# Patient Record
Sex: Male | Born: 1950 | Race: Black or African American | Hispanic: No | Marital: Married | Smoking: Former smoker
Health system: Southern US, Community
[De-identification: ages and names within clinical notes are randomized; demographics above are authoritative.]

## PROBLEM LIST (undated history)

## (undated) DIAGNOSIS — I519 Heart disease, unspecified: Secondary | ICD-10-CM

## (undated) DIAGNOSIS — N138 Other obstructive and reflux uropathy: Secondary | ICD-10-CM

## (undated) DIAGNOSIS — N189 Chronic kidney disease, unspecified: Secondary | ICD-10-CM

## (undated) DIAGNOSIS — G4730 Sleep apnea, unspecified: Secondary | ICD-10-CM

## (undated) DIAGNOSIS — Z950 Presence of cardiac pacemaker: Secondary | ICD-10-CM

## (undated) DIAGNOSIS — E538 Deficiency of other specified B group vitamins: Secondary | ICD-10-CM

## (undated) DIAGNOSIS — R972 Elevated prostate specific antigen [PSA]: Secondary | ICD-10-CM

## (undated) DIAGNOSIS — I2510 Atherosclerotic heart disease of native coronary artery without angina pectoris: Secondary | ICD-10-CM

## (undated) DIAGNOSIS — N183 Chronic kidney disease, stage 3 unspecified: Secondary | ICD-10-CM

## (undated) DIAGNOSIS — R0602 Shortness of breath: Secondary | ICD-10-CM

## (undated) DIAGNOSIS — M1990 Unspecified osteoarthritis, unspecified site: Secondary | ICD-10-CM

## (undated) DIAGNOSIS — E8881 Metabolic syndrome: Secondary | ICD-10-CM

## (undated) DIAGNOSIS — N401 Enlarged prostate with lower urinary tract symptoms: Secondary | ICD-10-CM

## (undated) DIAGNOSIS — E119 Type 2 diabetes mellitus without complications: Secondary | ICD-10-CM

## (undated) DIAGNOSIS — R52 Pain, unspecified: Secondary | ICD-10-CM

## (undated) DIAGNOSIS — I10 Essential (primary) hypertension: Secondary | ICD-10-CM

## (undated) DIAGNOSIS — J449 Chronic obstructive pulmonary disease, unspecified: Secondary | ICD-10-CM

## (undated) DIAGNOSIS — K649 Unspecified hemorrhoids: Secondary | ICD-10-CM

## (undated) DIAGNOSIS — K219 Gastro-esophageal reflux disease without esophagitis: Secondary | ICD-10-CM

## (undated) DIAGNOSIS — I272 Pulmonary hypertension, unspecified: Secondary | ICD-10-CM

## (undated) DIAGNOSIS — I442 Atrioventricular block, complete: Secondary | ICD-10-CM

## (undated) HISTORY — DX: Essential (primary) hypertension: I10

## (undated) HISTORY — DX: Gastro-esophageal reflux disease without esophagitis: K21.9

## (undated) HISTORY — DX: Other obstructive and reflux uropathy: N13.8

## (undated) HISTORY — DX: Chronic obstructive pulmonary disease, unspecified: J44.9

## (undated) HISTORY — DX: Chronic kidney disease, stage 3 unspecified: N18.30

## (undated) HISTORY — DX: Chronic kidney disease, unspecified: N18.9

## (undated) HISTORY — DX: Metabolic syndrome: E88.810

## (undated) HISTORY — PX: PROSTATE BIOPSY: SHX241

## (undated) HISTORY — DX: Heart disease, unspecified: I51.9

## (undated) HISTORY — DX: Deficiency of other specified B group vitamins: E53.8

## (undated) HISTORY — PX: COLON SURGERY: SHX602

## (undated) HISTORY — DX: Pulmonary hypertension, unspecified: I27.20

## (undated) HISTORY — PX: OTHER SURGICAL HISTORY: SHX169

## (undated) HISTORY — DX: Benign prostatic hyperplasia with lower urinary tract symptoms: N40.1

## (undated) HISTORY — DX: Atrioventricular block, complete: I44.2

## (undated) HISTORY — DX: Chronic kidney disease, stage 3 (moderate): N18.3

## (undated) HISTORY — DX: Metabolic syndrome: E88.81

---
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
## 2012-05-01 HISTORY — PX: CARDIAC CATHETERIZATION: SHX172

## 2013-03-14 HISTORY — PX: PACEMAKER INSERTION: SHX728

## 2013-07-30 DIAGNOSIS — I519 Heart disease, unspecified: Secondary | ICD-10-CM

## 2013-07-30 HISTORY — DX: Heart disease, unspecified: I51.9

## 2013-08-21 ENCOUNTER — Emergency Department (HOSPITAL_COMMUNITY): Payer: No Typology Code available for payment source

## 2013-08-21 ENCOUNTER — Encounter (HOSPITAL_COMMUNITY): Payer: Self-pay | Admitting: Emergency Medicine

## 2013-08-21 ENCOUNTER — Inpatient Hospital Stay (HOSPITAL_COMMUNITY)
Admission: EM | Admit: 2013-08-21 | Discharge: 2013-08-22 | DRG: 308 | Disposition: A | Discharge status: Home / Self Care | Payer: No Typology Code available for payment source | Attending: Internal Medicine | Admitting: Internal Medicine

## 2013-08-21 DIAGNOSIS — I442 Atrioventricular block, complete: Secondary | ICD-10-CM | POA: Diagnosis present

## 2013-08-21 DIAGNOSIS — R531 Weakness: Secondary | ICD-10-CM

## 2013-08-21 DIAGNOSIS — N183 Chronic kidney disease, stage 3 unspecified: Secondary | ICD-10-CM | POA: Diagnosis present

## 2013-08-21 DIAGNOSIS — I129 Hypertensive chronic kidney disease with stage 1 through stage 4 chronic kidney disease, or unspecified chronic kidney disease: Secondary | ICD-10-CM | POA: Diagnosis present

## 2013-08-21 DIAGNOSIS — D72829 Elevated white blood cell count, unspecified: Secondary | ICD-10-CM | POA: Diagnosis present

## 2013-08-21 DIAGNOSIS — K219 Gastro-esophageal reflux disease without esophagitis: Secondary | ICD-10-CM | POA: Diagnosis present

## 2013-08-21 DIAGNOSIS — I959 Hypotension, unspecified: Secondary | ICD-10-CM | POA: Diagnosis present

## 2013-08-21 DIAGNOSIS — N138 Other obstructive and reflux uropathy: Secondary | ICD-10-CM | POA: Diagnosis present

## 2013-08-21 DIAGNOSIS — Z950 Presence of cardiac pacemaker: Secondary | ICD-10-CM

## 2013-08-21 DIAGNOSIS — N179 Acute kidney failure, unspecified: Secondary | ICD-10-CM | POA: Diagnosis present

## 2013-08-21 DIAGNOSIS — R7989 Other specified abnormal findings of blood chemistry: Secondary | ICD-10-CM | POA: Diagnosis present

## 2013-08-21 DIAGNOSIS — I5021 Acute systolic (congestive) heart failure: Secondary | ICD-10-CM | POA: Diagnosis present

## 2013-08-21 DIAGNOSIS — I495 Sick sinus syndrome: Secondary | ICD-10-CM | POA: Diagnosis present

## 2013-08-21 DIAGNOSIS — R339 Retention of urine, unspecified: Secondary | ICD-10-CM | POA: Diagnosis present

## 2013-08-21 DIAGNOSIS — T82190A Other mechanical complication of cardiac electrode, initial encounter: Principal | ICD-10-CM | POA: Diagnosis present

## 2013-08-21 DIAGNOSIS — I5020 Unspecified systolic (congestive) heart failure: Secondary | ICD-10-CM | POA: Diagnosis present

## 2013-08-21 DIAGNOSIS — T82111A Breakdown (mechanical) of cardiac pulse generator (battery), initial encounter: Secondary | ICD-10-CM

## 2013-08-21 DIAGNOSIS — R799 Abnormal finding of blood chemistry, unspecified: Secondary | ICD-10-CM

## 2013-08-21 DIAGNOSIS — Y831 Surgical operation with implant of artificial internal device as the cause of abnormal reaction of the patient, or of later complication, without mention of misadventure at the time of the procedure: Secondary | ICD-10-CM | POA: Diagnosis present

## 2013-08-21 DIAGNOSIS — N401 Enlarged prostate with lower urinary tract symptoms: Secondary | ICD-10-CM | POA: Diagnosis present

## 2013-08-21 DIAGNOSIS — R000 Tachycardia, unspecified: Secondary | ICD-10-CM

## 2013-08-21 DIAGNOSIS — I509 Heart failure, unspecified: Secondary | ICD-10-CM | POA: Diagnosis present

## 2013-08-21 LAB — CBC WITH DIFFERENTIAL/PLATELET
Basophils Absolute: 0 10*3/uL (ref 0.0–0.1)
Basophils Relative: 0 % (ref 0–1)
EOS PCT: 1 % (ref 0–5)
Eosinophils Absolute: 0.1 10*3/uL (ref 0.0–0.7)
HEMATOCRIT: 47.8 % (ref 39.0–52.0)
Hemoglobin: 15.6 g/dL (ref 13.0–17.0)
LYMPHS ABS: 1.8 10*3/uL (ref 0.7–4.0)
LYMPHS PCT: 14 % (ref 12–46)
MCH: 28.5 pg (ref 26.0–34.0)
MCHC: 32.6 g/dL (ref 30.0–36.0)
MCV: 87.2 fL (ref 78.0–100.0)
MONO ABS: 0.6 10*3/uL (ref 0.1–1.0)
Monocytes Relative: 4 % (ref 3–12)
Neutro Abs: 10.7 10*3/uL — ABNORMAL HIGH (ref 1.7–7.7)
Neutrophils Relative %: 81 % — ABNORMAL HIGH (ref 43–77)
Platelets: 286 10*3/uL (ref 150–400)
RBC: 5.48 MIL/uL (ref 4.22–5.81)
RDW: 13.7 % (ref 11.5–15.5)
WBC: 13.2 10*3/uL — ABNORMAL HIGH (ref 4.0–10.5)

## 2013-08-21 LAB — URINALYSIS, ROUTINE W REFLEX MICROSCOPIC
Bilirubin Urine: NEGATIVE
GLUCOSE, UA: NEGATIVE mg/dL
HGB URINE DIPSTICK: NEGATIVE
Ketones, ur: NEGATIVE mg/dL
Leukocytes, UA: NEGATIVE
Nitrite: NEGATIVE
Protein, ur: NEGATIVE mg/dL
Specific Gravity, Urine: 1.022 (ref 1.005–1.030)
Urobilinogen, UA: 0.2 mg/dL (ref 0.0–1.0)
pH: 5 (ref 5.0–8.0)

## 2013-08-21 LAB — COMPREHENSIVE METABOLIC PANEL
ALBUMIN: 3.5 g/dL (ref 3.5–5.2)
ALK PHOS: 55 U/L (ref 39–117)
ALT: 21 U/L (ref 0–53)
AST: 18 U/L (ref 0–37)
BUN: 19 mg/dL (ref 6–23)
CO2: 17 mEq/L — ABNORMAL LOW (ref 19–32)
Calcium: 9.8 mg/dL (ref 8.4–10.5)
Chloride: 107 mEq/L (ref 96–112)
Creatinine, Ser: 1.94 mg/dL — ABNORMAL HIGH (ref 0.50–1.35)
GFR calc Af Amer: 40 mL/min — ABNORMAL LOW (ref >90)
GFR calc non Af Amer: 35 mL/min — ABNORMAL LOW (ref >90)
Glucose, Bld: 119 mg/dL — ABNORMAL HIGH (ref 70–99)
POTASSIUM: 5.3 meq/L (ref 3.7–5.3)
Sodium: 141 mEq/L (ref 137–147)
TOTAL PROTEIN: 7.4 g/dL (ref 6.0–8.3)
Total Bilirubin: 0.4 mg/dL (ref 0.3–1.2)

## 2013-08-21 LAB — PRO B NATRIURETIC PEPTIDE: PRO B NATRI PEPTIDE: 3632 pg/mL — AB (ref 0–125)

## 2013-08-21 LAB — I-STAT TROPONIN, ED: Troponin i, poc: 0.02 ng/mL (ref 0.00–0.08)

## 2013-08-21 MED ORDER — ONDANSETRON HCL 4 MG PO TABS: 4.0000 mg | ORAL_TABLET | Freq: Four times a day (QID) | ORAL | Status: DC | PRN

## 2013-08-21 MED ORDER — ONDANSETRON HCL 4 MG/2ML IJ SOLN: 4.0000 mg | Freq: Three times a day (TID) | INTRAMUSCULAR | Status: AC | PRN

## 2013-08-21 MED ORDER — ONDANSETRON HCL 4 MG/2ML IJ SOLN: 4.0000 mg | Freq: Once | INTRAMUSCULAR | Status: DC

## 2013-08-21 MED ORDER — SODIUM CHLORIDE 0.9 % IJ SOLN
3.0000 mL | INTRAMUSCULAR | Status: DC | PRN
Start: 2013-08-21 — End: 2013-08-22

## 2013-08-21 MED ORDER — SODIUM CHLORIDE 0.9 % IV BOLUS (SEPSIS)
1000.0000 mL | Freq: Once | INTRAVENOUS | Status: AC
  Administered 2013-08-21: 1000 mL via INTRAVENOUS

## 2013-08-21 MED ORDER — ACETAMINOPHEN 650 MG RE SUPP: 650.0000 mg | Freq: Four times a day (QID) | RECTAL | Status: DC | PRN

## 2013-08-21 MED ORDER — SODIUM CHLORIDE 0.9 % IV SOLN
250.0000 mL | INTRAVENOUS | Status: DC | PRN
Start: 2013-08-21 — End: 2013-08-22

## 2013-08-21 MED ORDER — TAMSULOSIN HCL 0.4 MG PO CAPS
0.4000 mg | ORAL_CAPSULE | Freq: Every day | ORAL | Status: DC
  Administered 2013-08-22: 0.4 mg via ORAL
  Filled 2013-08-21: qty 1

## 2013-08-21 MED ORDER — PANTOPRAZOLE SODIUM 40 MG PO TBEC
40.0000 mg | DELAYED_RELEASE_TABLET | Freq: Every day | ORAL | Status: DC
  Administered 2013-08-22: 40 mg via ORAL
  Filled 2013-08-21: qty 1

## 2013-08-21 MED ORDER — SODIUM CHLORIDE 0.9 % IJ SOLN
3.0000 mL | Freq: Two times a day (BID) | INTRAMUSCULAR | Status: DC
  Administered 2013-08-22: 3 mL via INTRAVENOUS

## 2013-08-21 MED ORDER — ONDANSETRON HCL 4 MG/2ML IJ SOLN: 4.0000 mg | Freq: Four times a day (QID) | INTRAMUSCULAR | Status: DC | PRN

## 2013-08-21 MED ORDER — ENOXAPARIN SODIUM 30 MG/0.3ML ~~LOC~~ SOLN
30.0000 mg | SUBCUTANEOUS | Status: DC
Start: 2013-08-22 — End: 2013-08-22
  Administered 2013-08-22: 30 mg via SUBCUTANEOUS
  Filled 2013-08-21: qty 0.3

## 2013-08-21 MED ORDER — HYDROCODONE-ACETAMINOPHEN 5-325 MG PO TABS: 1.0000 | ORAL_TABLET | ORAL | Status: DC | PRN

## 2013-08-21 MED ORDER — ASPIRIN EC 325 MG PO TBEC
325.0000 mg | DELAYED_RELEASE_TABLET | Freq: Every day | ORAL | Status: DC
  Administered 2013-08-22: 325 mg via ORAL
  Filled 2013-08-21: qty 1

## 2013-08-21 MED ORDER — ACETAMINOPHEN 325 MG PO TABS: 650.0000 mg | ORAL_TABLET | Freq: Four times a day (QID) | ORAL | Status: DC | PRN

## 2013-08-21 MED ORDER — MORPHINE SULFATE 2 MG/ML IJ SOLN: 2.0000 mg | INTRAMUSCULAR | Status: DC | PRN

## 2013-08-21 NOTE — ED Notes (Signed)
3E nurse call for report refer to previous nurse Robin in pod A

## 2013-08-21 NOTE — ED Notes (Signed)
The pt does not want zofran at present

## 2013-08-21 NOTE — ED Notes (Signed)
Attempted to call report

## 2013-08-21 NOTE — ED Provider Notes (Signed)
Medical screening examination/treatment/procedure(s) were conducted as a shared visit with non-physician practitioner(s) and myself.  I personally evaluated the patient during the encounter.   EKG Interpretation   Date/Time:  Thursday August 21 2013 16:04:49 EDT Ventricular Rate:  118 PR Interval:    QRS Duration: 154 QT Interval:  380 QTC Calculation: 532 R Axis:   -87 Text Interpretation:  Ventricular-paced rhythm Abnormal ECG Confirmed by  Julian Askin  MD, Loma Sousa ([id number]) on 08/21/2013 7:53:55 PM        EMERGENCY DEPARTMENT [place] CARDIAC EXAM "Study: Limited Ultrasound of the heart and pericardium"  INDICATIONS:Hypotension and Unstable Vital Signs Multiple views of the heart and pericardium are obtained with a multi-frequency probe.  PERFORMED GY:BWLSLH  IMAGES ARCHIVED?: No  FINDINGS: Fair contractility  LIMITATIONS:  Emergent procedure  VIEWS USED: Parasternal long axis  INTERPRETATION: Fair contractility  COMMENT:  Patient's heart evaluated secondary to hypotension and recent cardiac history. Mild decreased EF on gross observation, there is fair contractility. No effusion noted.  This is a 63 year old male with recent pacemaker placement who presents with generalized fatigue and nausea. Noted to be hypertensive upon arrival. EKG shows what appears to be a ventricularly paced rhythm. There is no prior for comparison. He is tachycardic. Lab work notable for elevated BNP and creatinine. Concern for heart failure resulting in hypotension and decreased renal perfusion. Bedside echo with fair contractility and possible mild decreased EF of 45-55% on gross observation. Patient was given a small fluid bolus of 500 cc for hypertension.  Attempt to obtain outside records from [place]. Patient will require admission. Suspect patient will need formal echocardiogram and possible cardiology consultation.  Merryl Hacker, MD 08/21/13 2041

## 2013-08-21 NOTE — ED Notes (Signed)
Faxed Release of Info paperwork to [facility]

## 2013-08-21 NOTE — ED Notes (Addendum)
Attempted report 

## 2013-08-21 NOTE — ED Notes (Signed)
Bed control called. Bed changed to [place].

## 2013-08-21 NOTE — H&P (Signed)
[facility]                                                                                    Patient Demographics  Troy Petty, is a 63 y.o. male  CSN: [id number]  MRN: [id number]  DOB - 1950-09-03  Admit Date - 08/21/2013  Outpatient Primary MD for the patient is No PCP Per Patient   With History of -  History reviewed. No pertinent past medical history.    Past Surgical History  Procedure Laterality Date  . Pacemaker insertion      in for   Chief Complaint  Patient presents with  . Nausea     HPI  Troy Petty  is a 63 y.o. male, with history of hypertension, sick sinus syndrome status post pacemaker placement around 5 months ago, presenting with 2 days history of nausea , atypical chest pain and feeling weak. He said that his cardiologist checked his pacemaker on Monday and it was fine. No reports of fever chills cough but he felt short-winded. His chest x-ray and urine were negative in the emergency room, however his heart rate was elevated at around 120 and paste and he was hypotensive. He received IV fluids in the emergency room and pacemaker representative was called to evaluate.    Review of Systems    In addition to the HPI above,  No Fever-chills, No Headache, No changes with Vision or hearing, No problems swallowing food or Liquids, No Abdominal pain, mild nausea but no vomiting, Bowel movements are regular, No Blood in stool or Urine, No dysuria, No new skin rashes or bruises, No new joints pains-aches,  No new weakness, tingling, numbness in any extremity, No recent weight gain or loss, No polyuria, polydypsia or polyphagia, No significant Mental Stressors.  A full 10 point Review of Systems was done, except as stated above, all other Review of Systems were negative.   Social History History  Substance Use Topics  . Smoking status: Never Smoker   . Smokeless tobacco: Not on file  . Alcohol Use: No     Family  History Significant for hypertension and diabetes  Prior to Admission medications   Medication Sig Start Date End Date Taking? Authorizing Provider  Aspirin-Salicylamide-Caffeine (ARTHRITIS STRENGTH BC POWDER PO) Take 1 Package by mouth daily as needed (pain).   Yes Historical Provider, MD  lisinopril (PRINIVIL,ZESTRIL) 40 MG tablet Take 40 mg by mouth daily.   Yes Historical Provider, MD  omeprazole (PRILOSEC) 20 MG capsule Take 20 mg by mouth daily as needed (acid reflux).   Yes Historical Provider, MD  tamsulosin (FLOMAX) 0.4 MG CAPS capsule Take 0.4 mg by mouth daily.   Yes Historical Provider, MD    No Known Allergies  Physical Exam  Vitals  Blood pressure 102/86, pulse 115, temperature 98.6 F (37 C), temperature source Oral, resp. rate 18, height 5\' 11"  (1.803 m), weight 92.987 kg (205 lb), SpO2 97.00%.   1. General in no acute distress very pleasant  2. Normal affect and insight, Not Suicidal or Homicidal, Awake Alert, Oriented X 3.  3. No F.N deficits, ALL C.Nerves Intact, Strength 5/5 all 4 extremities, Sensation  intact all 4 extremities, Plantars down going.  4. Ears and Eyes appear Normal, Conjunctivae clear, PERRLA. Moist Oral Mucosa.  5. Supple Neck, No JVD, No cervical lymphadenopathy appriciated, No Carotid Bruits.  6. Symmetrical Chest wall movement, Good air movement bilaterally, CTAB.  7. RRR, No Gallops, Rubs or Murmurs, No Parasternal Heave.  8. Positive Bowel Sounds, Abdomen Soft, Non tender, No organomegaly appriciated,No rebound -guarding or rigidity.  9.  No Cyanosis, Normal Skin Turgor, No Skin Rash or Bruise.  10. Good muscle tone,  joints appear normal , no effusions, Normal ROM.  11. No Palpable Lymph Nodes in Neck or Axillae    Data Review  CBC  Recent Labs Lab 08/21/13 1436  WBC 13.2*  HGB 15.6  HCT 47.8  PLT 286  MCV 87.2  MCH 28.5  MCHC 32.6  RDW 13.7  LYMPHSABS 1.8  MONOABS 0.6  EOSABS 0.1  BASOSABS 0.0    ------------------------------------------------------------------------------------------------------------------  Chemistries   Recent Labs Lab 08/21/13 1436  NA 141  K 5.3  CL 107  CO2 17*  GLUCOSE 119*  BUN 19  CREATININE 1.94*  CALCIUM 9.8  AST 18  ALT 21  ALKPHOS 55  BILITOT 0.4   ------------------------------------------------------------------------------------------------------------------ estimated creatinine clearance is 44.8 ml/min (by C-G formula based on Cr of 1.94). ------------------------------------------------------------------------------------------------------------------ No results found for this basename: TSH, T4TOTAL, FREET3, T3FREE, THYROIDAB,  in the last 72 hours   Coagulation profile No results found for this basename: INR, PROTIME,  in the last 168 hours ------------------------------------------------------------------------------------------------------------------- No results found for this basename: DDIMER,  in the last 72 hours -------------------------------------------------------------------------------------------------------------------  Cardiac Enzymes No results found for this basename: CK, CKMB, TROPONINI, MYOGLOBIN,  in the last 168 hours ------------------------------------------------------------------------------------------------------------------ No components found with this basename: POCBNP,    ---------------------------------------------------------------------------------------------------------------  Urinalysis    Component Value Date/Time   COLORURINE YELLOW 08/21/2013 [place] 08/21/2013 1825   LABSPEC 1.022 08/21/2013 1825   PHURINE 5.0 08/21/2013 1825   GLUCOSEU NEGATIVE 08/21/2013 1825   HGBUR NEGATIVE 08/21/2013 1825   BILIRUBINUR NEGATIVE 08/21/2013 1825   KETONESUR NEGATIVE 08/21/2013 1825   PROTEINUR NEGATIVE 08/21/2013 1825   UROBILINOGEN 0.2 08/21/2013 1825   NITRITE NEGATIVE 08/21/2013  [place] 08/21/2013 1825    ----------------------------------------------------------------------------------------------------------------  .   Imaging results:   Dg Chest Port 1 View  08/21/2013   CLINICAL DATA:  Nausea.  EXAM: PORTABLE CHEST - 1 VIEW  COMPARISON:  05/09/2012.  FINDINGS: 1627 hr. There is a new left subclavian pacemaker with leads in the right atrium and right ventricle. The heart size and mediastinal contours are stable. Asymmetric emphysematous changes in the right upper lobe are stable. No pneumothorax, superimposed airspace disease or significant pleural effusion is seen.  IMPRESSION: No acute chest findings.  Stable right upper lobe emphysema.   Electronically Signed   By: Camie Patience M.D.   On: 08/21/2013 16:51    My personal review of EKG: And paste at 118 beats per minute    Assessment & Plan  1. tachycardia with hypotension, discussed with cardiology, pacemaker needs to be checked. Rule out pacemaker-induced cardiomyopathy. Rule out cardiogenic source      Troy contacted and are on their way to see the patient .      Apical ordered for the morning      Haldol for IV fluids due to to elevated BNP      Patient will be observed in telemetry  Please consult cardiology in a.m.      Serial troponins 2. History of hypertension , Zestril placed on hold 3. GERD continue with Protonix 4. History of urinary retention continue with Flomax 5. Renal failure; acuity unknown . follow creatinine and am    DVT Prophylaxis Lovenox  AM Labs Ordered, also please review Full Orders  Family Communication: Admission, patients condition and plan of care including tests being ordered have been discussed with the patient and wife who indicate understanding and agree with the plan and Code Status.  Code Status full  Disposition Plan: Home  Time spent in minutes : 35 minutes  Condition GUARDED   @SIGNATURE @

## 2013-08-21 NOTE — ED Provider Notes (Signed)
CSN: [id number]     Arrival date & time 08/21/13  1428 History   First MD Initiated Contact with Patient 08/21/13 1617     Chief Complaint  Patient presents with  . Nausea     (Consider location/radiation/quality/duration/timing/severity/associated sxs/prior Treatment) The history is provided by the patient and medical records. No language interpreter was used.    Marcia Lepera is a 63 y.o. male  with a hx of pacemaker (76mos ago by Dr. Truman Hayward [facility]), GERD, BPH presents to the Emergency Department complaining of gradual, persistent, progressively worsening generalized weakness with associated nausea, fatigue, shortness of breath and generally ill feeling onset 48 hours ago.  Patient reports he felt poorly on Tuesday. He states he felt better on Wednesday and was able to milk the grass. He reports he awoke this morning feeling unwell and has progressed throughout the day. He reports an episode of diaphoresis in a car in route to the ER.  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 He denies previous MI, CABG or other cardiac disease. Patient's wife states that he had a "procedure" that showed he did not have any clogged arteries she does not know what this is.  It is my function that he had a cardiac cath at this time. Will attempt to obtain records from [facility].  Patient denies syncope today.  No aggravating or alleviating symptoms. He denies fever, chills, headache, neck pain, chest pain, abdominal pain, vomiting, diarrhea, syncope, dysuria, hematuria.  Meds: flomax, lisinopril 40mg , prilosec  History reviewed. No pertinent past medical history. Past Surgical History  Procedure Laterality Date  . Pacemaker insertion     History reviewed. No pertinent family history. History  Substance Use Topics  . Smoking status: Never Smoker   . Smokeless  tobacco: Not on file  . Alcohol Use: No    Review of Systems  Constitutional: Positive for diaphoresis and fatigue. Negative for fever, appetite change and unexpected weight change.  HENT: Negative for mouth sores.   Eyes: Negative for visual disturbance.  Respiratory: Negative for cough, chest tightness, shortness of breath and wheezing.   Cardiovascular: Positive for chest pain.  Gastrointestinal: Positive for nausea. Negative for vomiting, abdominal pain, diarrhea and constipation.  Endocrine: Negative for polydipsia, polyphagia and polyuria.  Genitourinary: Negative for dysuria, urgency, frequency and hematuria.  Musculoskeletal: Negative for back pain and neck stiffness.  Skin: Negative for rash.  Allergic/Immunologic: Negative for immunocompromised state.  Neurological: Positive for weakness (general) and light-headedness. Negative for syncope and headaches.  Hematological: Does not bruise/bleed easily.  Psychiatric/Behavioral: Negative for sleep disturbance. The patient is not nervous/anxious.       Allergies  Review of patient's allergies indicates no known allergies.  Home Medications   Prior to Admission medications   Not on File   BP 102/86  Pulse 115  Temp(Src) 98.6 F (37 C) (Oral)  Resp 18  Ht 5\' 11"  (1.803 m)  Wt 205 lb (92.987 kg)  BMI 28.60 kg/m2  SpO2 97% Physical Exam  Nursing note and vitals reviewed. Constitutional: He is oriented to person, place, and time. He appears well-developed and well-nourished. No distress.  Awake, alert,  HENT:  Head: Normocephalic and atraumatic.  Mouth/Throat: Oropharynx is clear and moist. No oropharyngeal exudate.  Eyes: Conjunctivae are normal. No scleral icterus.  Neck: Normal range of motion. Neck supple.  Cardiovascular: Regular rhythm, normal heart sounds  and intact distal pulses.   Tachycardia No murmur  Pulmonary/Chest: Effort normal and breath sounds normal. No respiratory distress. He has no wheezes.   Clear and equal breath sounds without focal rails, rhonchi or wheezes No chest tenderness Pacemaker in left upper chest wall  Abdominal: Soft. Bowel sounds are normal. He exhibits no mass. There is no tenderness. There is no rebound and no guarding.  Abdomen soft and nontender No CVA tenderness Well healed abdominal scar (pt reports from a childhood football injury)  Musculoskeletal: Normal range of motion. He exhibits no edema.  No pitting edema of the lower extremities  Neurological: He is alert and oriented to person, place, and time. He exhibits normal muscle tone. Coordination normal.  Speech is clear and goal oriented Moves extremities without ataxia  Skin: Skin is warm and dry. He is not diaphoretic. No erythema.  No rash Skin warm and dry, no diaphoresis  Psychiatric: He has a normal mood and affect.    ED Course  Procedures (including critical care time) Labs Review Labs Reviewed  COMPREHENSIVE METABOLIC PANEL - Abnormal; Notable for the following:    CO2 17 (*)    Glucose, Bld 119 (*)    Creatinine, Ser 1.94 (*)    GFR calc non Af Amer 35 (*)    GFR calc Af Amer 40 (*)    All other components within normal limits  CBC WITH DIFFERENTIAL - Abnormal; Notable for the following:    WBC 13.2 (*)    Neutrophils Relative % 81 (*)    Neutro Abs 10.7 (*)    All other components within normal limits  PRO B NATRIURETIC PEPTIDE - Abnormal; Notable for the following:    Pro B Natriuretic peptide (BNP) 3632.0 (*)    All other components within normal limits  URINALYSIS, ROUTINE W REFLEX MICROSCOPIC  I-STAT TROPOININ, ED    Imaging Review Dg Chest Port 1 View  08/21/2013   CLINICAL DATA:  Nausea.  EXAM: PORTABLE CHEST - 1 VIEW  COMPARISON:  05/09/2012.  FINDINGS: 1627 hr. There is a new left subclavian pacemaker with leads in the right atrium and right ventricle. The heart size and mediastinal contours are stable. Asymmetric emphysematous changes in the right upper lobe are  stable. No pneumothorax, superimposed airspace disease or significant pleural effusion is seen.  IMPRESSION: No acute chest findings.  Stable right upper lobe emphysema.   Electronically Signed   By: Camie Patience M.D.   On: 08/21/2013 16:51     EKG Interpretation   Date/Time:  Thursday August 21 2013 16:04:49 EDT Ventricular Rate:  118 PR Interval:    QRS Duration: 154 QT Interval:  380 QTC Calculation: 532 R Axis:   -87 Text Interpretation:  Ventricular-paced rhythm Abnormal ECG Confirmed by  HORTON  MD, COURTNEY ([id number]) on 08/21/2013 7:53:55 PM      MDM   Final diagnoses:  Pacemaker  Weakness  Hypotension  Tachycardia  Elevated brain natriuretic peptide (BNP) level  Elevated serum creatinine   Karena Addison resents the emergency department with nonspecific symptoms including general illness, nausea, shortness of breath and diaphoresis. Patient with recent cardiac pacemaker placement. He reports at last check his demand pacer was not being used however today on ECG patient is in a paced rhythm.  No syncope today however patient was found to be hypotensive 80/50 and triaged and tachycardic.  Concern for acute coronary syndrome.  We'll attempt to obtain previous records.  6:01 PM Initial troponin negative. CBC with leukocytosis  of 13.2. In portable chest x-ray without evidence of pneumonia, pulmonary edema or pneumothorax.  Other labs pending.  I personally reviewed the imaging tests through PACS system.  I reviewed available ER/hospitalization records through the EMR.  7:51 PM No outside records to review. BNP elevated to greater than 3600 and serum creatinine elevated to 1.94. Unknown baseline for patient. Mild leukocytosis; nonspecific.  Concern for new onset congestive heart failure. Patient symptoms likely of cardiac origin.  Anticipate admission.  Discussed with Dr. Laren Everts who will admit.    Jarrett Soho Yicel Shannon, PA-C 08/21/13 1958

## 2013-08-21 NOTE — ED Notes (Signed)
He states he's felt nauseated and fatigued for past few days. Denies any pain. A&Ox4, resp e/u

## 2013-08-21 NOTE — ED Notes (Signed)
The pt is still not having pain.  No longer nauseated.

## 2013-08-21 NOTE — ED Notes (Signed)
Still tachycardic

## 2013-08-21 NOTE — ED Notes (Signed)
Reassessed pt and he complains of dizziness with standing, hypotension, and some sob.  No pain, reports nausea.  EKG done and shown to dr. Dina Rich.  BP 80/50 manually.  Started new protocol orders

## 2013-08-22 DIAGNOSIS — N179 Acute kidney failure, unspecified: Secondary | ICD-10-CM

## 2013-08-22 LAB — BASIC METABOLIC PANEL
BUN: 21 mg/dL (ref 6–23)
CALCIUM: 8.8 mg/dL (ref 8.4–10.5)
CHLORIDE: 107 meq/L (ref 96–112)
CO2: 21 meq/L (ref 19–32)
Creatinine, Ser: 1.93 mg/dL — ABNORMAL HIGH (ref 0.50–1.35)
GFR calc Af Amer: 41 mL/min — ABNORMAL LOW (ref >90)
GFR calc non Af Amer: 35 mL/min — ABNORMAL LOW (ref >90)
Glucose, Bld: 96 mg/dL (ref 70–99)
Potassium: 4.3 mEq/L (ref 3.7–5.3)
SODIUM: 141 meq/L (ref 137–147)

## 2013-08-22 LAB — CBC
HCT: 42.7 % (ref 39.0–52.0)
Hemoglobin: 13.4 g/dL (ref 13.0–17.0)
MCH: 27.7 pg (ref 26.0–34.0)
MCHC: 31.4 g/dL (ref 30.0–36.0)
MCV: 88.4 fL (ref 78.0–100.0)
Platelets: 265 10*3/uL (ref 150–400)
RBC: 4.83 MIL/uL (ref 4.22–5.81)
RDW: 13.9 % (ref 11.5–15.5)
WBC: 9.2 10*3/uL (ref 4.0–10.5)

## 2013-08-22 LAB — CREATININE, SERUM
Creatinine, Ser: 2 mg/dL — ABNORMAL HIGH (ref 0.50–1.35)
GFR calc Af Amer: 39 mL/min — ABNORMAL LOW (ref >90)
GFR, EST NON AFRICAN AMERICAN: 34 mL/min — AB (ref >90)

## 2013-08-22 LAB — TROPONIN I
Troponin I: <0.3 ng/mL
Troponin I: <0.3 ng/mL

## 2013-08-22 LAB — TSH: TSH: 0.964 u[IU]/mL (ref 0.350–4.500)

## 2013-08-22 LAB — D-DIMER, QUANTITATIVE (NOT AT ARMC): D DIMER QUANT: 0.34 ug{FEU}/mL (ref 0.00–0.48)

## 2013-08-22 MED ORDER — ENOXAPARIN SODIUM 40 MG/0.4ML ~~LOC~~ SOLN
40.0000 mg | Freq: Every day | SUBCUTANEOUS | Status: DC
  Filled 2013-08-22: qty 0.4

## 2013-08-22 MED ORDER — ASPIRIN EC 81 MG PO TBEC: 81.0000 mg | DELAYED_RELEASE_TABLET | Freq: Every day | ORAL | Status: DC

## 2013-08-22 MED ORDER — CARVEDILOL 6.25 MG PO TABS: 6.2500 mg | ORAL_TABLET | Freq: Two times a day (BID) | ORAL | Status: DC

## 2013-08-22 NOTE — Progress Notes (Signed)
Pt being dc to home with wife, follow up appointments and medications reviewed, pt verbalized understanding, pt left via wheelchair, pt stable

## 2013-08-22 NOTE — Progress Notes (Signed)
UR completed Rhyleigh Grassel K. Caswell Alvillar, RN, BSN, [place], CCM  08/22/2013 11:36 AM

## 2013-08-22 NOTE — Discharge Summary (Addendum)
Physician Discharge Summary  Troy Petty YQM:[id number] DOB: Feb 01, 1951 DOA: 08/21/2013  PCP: No PCP Per Patient  Admit date: 08/21/2013 Discharge date: 08/22/2013  Recommendations for Outpatient Follow-up:  1. F/u with primary care doctor in 1 month or sooner as needed.  Repeat BMP and CBC at next appointment. 2. F/u with cardiologist in one month. Consider repeating echocardiogram to evaluate ejection fraction.  If better, okay to stop aspirin and carvedilol.    Discharge Diagnoses:  Principal Problem:   Pacemaker malfunction Active Problems:   Hypotension   Tachycardia with sick sinus syndrome   GERD (gastroesophageal reflux disease)   Urinary retention   Elevated brain natriuretic peptide (BNP) level   Acute kidney injury   Acute systolic congestive heart failure   Discharge Condition: stable, improved  Diet recommendation: healthy heart  Wt Readings from Last 3 Encounters:  08/22/13 93.441 kg (206 lb)    History of present illness:  Troy Petty is a 63 y.o. male, with history of hypertension, sick sinus syndrome status post pacemaker placement around 5 months ago, presenting with 2 days history of nausea , atypical chest pain and feeling weak. He said that his cardiologist checked his pacemaker on Monday and it was fine. No reports of fever chills cough but he felt Ellan Tess-winded. His chest x-ray and urine were negative in the emergency room, however his heart rate was elevated at around 120 and paste and he was hypotensive. He received IV fluids in the emergency room and pacemaker representative was called to evaluate.   Hospital Course:   Tachycardia and hypotension, likely were secondary to the pacemaker being paced at a fast rhythm. His rate was in the 120s.  After he received fluids in the emergency department, he felt a little better, however he had marked improvement after his pacemaker was interrogated and found to be running fast. It was slowed to a normal  rate, and is seen as the rate was decreased, his blood pressure immediately improved and his symptoms resolved. Telemetry demonstrated a paced rhythm in the 80s. He continued aspirin. Troponins were negative. D-dimer was negative. TSH was within normal limits. He was afebrile and his chest x-ray was negative and his urinalysis was unremarkable. His blood pressures were initially held, but may be resumed at discharge. Orthostatics were negative.  Acute systolic heart failure seen on echocardiogram in the emergency department with estimated ejection fraction of approximately 45%.  This is likely secondary to his persistent tachycardia over the last week. Recommended he have a repeat echocardiogram done in 4-6 weeks to make sure that his ejection fraction has returned to normal.  He is advised heat low-salt diet. He should call his cardiologist if he gains more than 3 pounds in one day or 5 pounds in one week.  He is already on ACEI, but added low dose carvedilol also in the meantime.  Continue daily aspirin.    Pacemaker placed possibly due to sick sinus syndrome versus second or third degree heart block.  GERD, stable, continue Protonix.  BPH, stable, continue Flomax.   Chronic kidney disease, stage III.  Creatinine was a little elevated from his baseline of 1.8 upon admission. His creatinine down to 1.93. Anticipate that his kidney function will continue to improve. His primary care doctor to repeat BMP at his next appointment.  Leukocytosis was likely secondary to stress demargination.   Chest x-ray and urinalysis were negative. He remained afebrile. There is no evidence of underlying infection. Repeat CBC at next appointment with primary  care doctor. He should call his primary care doctor if he develops symptoms of infection.   Consultants:  Cardiology  Procedures:  Bedside echo in ER Chest x-ray Antibiotics:  None    Discharge Exam: Filed Vitals:   08/22/13 1500  BP: 148/84  Pulse: 89   Temp: 98 F (36.7 C)  Resp: 18   Filed Vitals:   08/22/13 1323 08/22/13 1324 08/22/13 1325 08/22/13 1500  BP: 148/76 150/81 146/92 148/84  Pulse: 73 83 92 89  Temp:    98 F (36.7 C)  TempSrc:    Oral  Resp:    18  Height:      Weight:      SpO2:    97%    General: African American male, No acute distress  HEENT: NCAT, MMM  Cardiovascular: RRR, nl S1, S2 no mrg, 2+ pulses, warm extremities  Respiratory: CTAB, no increased WOB  Abdomen: NABS, soft, NT/ND  MSK: Normal tone and bulk, no LEE  Neuro: Grossly intact   Discharge Instructions      Discharge Orders   Future Orders Complete By Expires   (HEART FAILURE PATIENTS) Call MD:  Anytime you have any of the following symptoms: 1) 3 pound weight gain in 24 hours or 5 pounds in 1 week 2) shortness of breath, with or without a dry hacking cough 3) swelling in the hands, feet or stomach 4) if you have to sleep on extra pillows at night in order to breathe.  As directed    Call MD for:  difficulty breathing, headache or visual disturbances  As directed    Call MD for:  extreme fatigue  As directed    Call MD for:  hives  As directed    Call MD for:  persistant dizziness or light-headedness  As directed    Call MD for:  persistant nausea and vomiting  As directed    Call MD for:  severe uncontrolled pain  As directed    Call MD for:  temperature >100.4  As directed    Diet - low sodium heart healthy  As directed    Discharge instructions  As directed    Increase activity slowly  As directed        Medication List         ARTHRITIS STRENGTH BC POWDER PO  Take 1 Package by mouth daily as needed (pain).     aspirin EC 81 MG tablet  Take 1 tablet (81 mg total) by mouth daily.     carvedilol 6.25 MG tablet  Commonly known as:  COREG  Take 1 tablet (6.25 mg total) by mouth 2 (two) times daily with a meal.     lisinopril 40 MG tablet  Commonly known as:  PRINIVIL,ZESTRIL  Take 40 mg by mouth daily.     omeprazole 20  MG capsule  Commonly known as:  PRILOSEC  Take 20 mg by mouth daily as needed (acid reflux).     tamsulosin 0.4 MG Caps capsule  Commonly known as:  FLOMAX  Take 0.4 mg by mouth daily.       Follow-up Information   Follow up with Primary care doctor. Schedule an appointment as soon as possible for a visit in 1 month. (or sooner if needed)       Follow up with Dr. Truman Hayward. Schedule an appointment as soon as possible for a visit in 1 month. (repeat ECHO)       [facility] with [facility]. Schedule an appointment as soon as possible for a visit in 1 month.   Contact information:   [place] [id number] [phone]       The results of significant diagnostics from this hospitalization (including imaging, microbiology, ancillary and laboratory) are listed below for reference.    Significant Diagnostic Studies: Dg Chest Port 1 View  08/21/2013   CLINICAL DATA:  Nausea.  EXAM: PORTABLE CHEST - 1 VIEW  COMPARISON:  05/09/2012.  FINDINGS: 1627 hr. There is a new left subclavian pacemaker with leads in the right atrium and right ventricle. The heart size and mediastinal contours are stable. Asymmetric emphysematous changes in the right upper lobe are stable. No pneumothorax, superimposed airspace disease or significant pleural effusion is seen.  IMPRESSION: No acute chest findings.  Stable right upper lobe emphysema.   Electronically Signed   By: Camie Patience M.D.   On: 08/21/2013 16:51    Microbiology: No results found for this or any previous visit (from the past 240 hour(s)).   Labs: Basic Metabolic Panel:  Recent Labs Lab 08/21/13 0135 08/21/13 1436 08/22/13 0515  NA  --  141 141  K  --  5.3 4.3  CL  --  107 107  CO2  --  17* 21  GLUCOSE  --  119* 96  BUN  --  19 21  CREATININE 2.00* 1.94* 1.93*  CALCIUM  --  9.8 8.8   Liver Function Tests:  Recent Labs Lab 08/21/13 1436  AST 18  ALT 21   ALKPHOS 55  BILITOT 0.4  PROT 7.4  ALBUMIN 3.5   No results found for this basename: LIPASE, AMYLASE,  in the last 168 hours No results found for this basename: AMMONIA,  in the last 168 hours CBC:  Recent Labs Lab 08/21/13 0135 08/21/13 1436  WBC 9.2 13.2*  NEUTROABS  --  10.7*  HGB 13.4 15.6  HCT 42.7 47.8  MCV 88.4 87.2  PLT 265 286   Cardiac Enzymes:  Recent Labs Lab 08/22/13 0135 08/22/13 0515 08/22/13 1039  TROPONINI <0.30 <0.30 <0.30   BNP: BNP (last 3 results)  Recent Labs  08/21/13 1615  PROBNP 3632.0*   CBG: No results found for this basename: GLUCAP,  in the last 168 hours  Time coordinating discharge: 45 minutes  Signed:  Janece Canterbury  [facility] 08/22/2013, 4:02 PM

## 2013-08-22 NOTE — Progress Notes (Signed)
Pt a/o, no c/o pain, ortho's negative, vss, pt stable

## 2013-08-22 NOTE — Progress Notes (Signed)
[facility] PROGRESS NOTE  Troy Petty CVE:[id number] DOB: 01/31/1951 DOA: 08/21/2013 PCP: No PCP Per Patient  Assessment/Plan  Tachycardia and hypotension, resolved.   -  Cardiology consultation -  Device interrogation -  ECHO pending -  Troponins neg -  Continue ASA -  Telemetry:  Paced rhythm, HR trending down -  BP improved  -  D-dimer to eval for PE and f/u VQ scan if > 640.  -  TSH -  No evidence of underlying sepsis:  Afebrile, CXR neg, UA neg -  Continue to hold BP medications  -  check orthostatics and if positive hydrate  Acute vs. Chronic systolic heart failure seen on ECHO in ER,  last ejection fraction per cardiology records was 60-65% in 02/28/13 -  Formal ECHO today -  Awaiting records from [facility] sinus syndrome s/p PPM, see above  GERD, stable, continue Protonix  BPH, stable, continue Flomax  Acute  on chronic renal failure, baseline creatinine is unknown but the last creatinine from cardiology office was 1.64 in July 2014.  May have some mild ATN secondary to transient hypotension, creatinine trending down -  Urinalysis is negative -  Renally dose medications (lovenox) -  Minimize nephrotoxins  Leukocytosis, likely stressed margination -  Chest x-ray negative -  Urinalysis negative -  Patient afebrile -  Monitor for underlying infection  Diet:  Healthy heart  Access:  PIV IVF:  Off  Proph:  Lovenox   Code Status: Full  Family Communication:  patient and wife Disposition Plan:  possibly home later today is echocardiogram is stable, cardiology feels pacemaker is working appropriately. He will need close followup for repeat CBC and BMP to followup leukocytosis and kidney function.   Consultants:  Cardiology   Procedures:  Bedside echo    echo  Chest x-ray  Antibiotics:  None    HPI/Subjective:  Patient states he feels much better. He felt a little better after a small fluid bolus in the emergency department but  after his pacemaker rate was changed back to a normal rate, he felt immediately better. He states that after his rate was decreased, his blood pressure improved immediately.   Objective: Filed Vitals:   08/21/13 2200 08/21/13 2215 08/21/13 2308 08/22/13 0534  BP: 107/76 117/55 117/85 133/77  Pulse: 80 86 87 74  Temp:   97.6 F (36.4 C) 98.3 F (36.8 C)  TempSrc:   Oral Oral  Resp: 16 20 19    Height:   5\' 11"  (1.803 m)   Weight:   94.076 kg (207 lb 6.4 oz) 93.441 kg (206 lb)  SpO2: 96% 96% 96% 96%    Intake/Output Summary (Last 24 hours) at 08/22/13 0833 Last data filed at 08/22/13 0258  Gross per 24 hour  Intake   1000 ml  Output    675 ml  Net    325 ml   Filed Weights   08/21/13 1753 08/21/13 2308 08/22/13 0534  Weight: 92.987 kg (205 lb) 94.076 kg (207 lb 6.4 oz) 93.441 kg (206 lb)    Exam:   General:   African American male, No acute distress  HEENT:  NCAT, MMM  Cardiovascular:  RRR, nl S1, S2 no mrg, 2+ pulses, warm extremities  Respiratory:  CTAB, no increased WOB  Abdomen:   NABS, soft, NT/ND  MSK:   Normal tone and bulk, no LEE  Neuro:  Grossly intact  Data Reviewed: Basic Metabolic Panel:  Recent Labs Lab 08/21/13 0135 08/21/13 1436 08/22/13 0515  NA  --  141 141  K  --  5.3 4.3  CL  --  107 107  CO2  --  17* 21  GLUCOSE  --  119* 96  BUN  --  19 21  CREATININE 2.00* 1.94* 1.93*  CALCIUM  --  9.8 8.8   Liver Function Tests:  Recent Labs Lab 08/21/13 1436  AST 18  ALT 21  ALKPHOS 55  BILITOT 0.4  PROT 7.4  ALBUMIN 3.5   No results found for this basename: LIPASE, AMYLASE,  in the last 168 hours No results found for this basename: AMMONIA,  in the last 168 hours CBC:  Recent Labs Lab 08/21/13 0135 08/21/13 1436  WBC 9.2 13.2*  NEUTROABS  --  10.7*  HGB 13.4 15.6  HCT 42.7 47.8  MCV 88.4 87.2  PLT 265 286   Cardiac Enzymes:  Recent Labs Lab 08/22/13 0135 08/22/13 0515  TROPONINI <0.30 <0.30   BNP (last 3  results)  Recent Labs  08/21/13 1615  PROBNP 3632.0*   CBG: No results found for this basename: GLUCAP,  in the last 168 hours  No results found for this or any previous visit (from the past 240 hour(s)).   Studies: Dg Chest Port 1 View  08/21/2013   CLINICAL DATA:  Nausea.  EXAM: PORTABLE CHEST - 1 VIEW  COMPARISON:  05/09/2012.  FINDINGS: 1627 hr. There is a new left subclavian pacemaker with leads in the right atrium and right ventricle. The heart size and mediastinal contours are stable. Asymmetric emphysematous changes in the right upper lobe are stable. No pneumothorax, superimposed airspace disease or significant pleural effusion is seen.  IMPRESSION: No acute chest findings.  Stable right upper lobe emphysema.   Electronically Signed   By: Camie Patience M.D.   On: 08/21/2013 16:51    Scheduled Meds: . aspirin EC  325 mg Oral Daily  . enoxaparin (LOVENOX) injection  30 mg Subcutaneous Q24H  . ondansetron (ZOFRAN) IV  4 mg Intravenous Once  . pantoprazole  40 mg Oral Daily  . sodium chloride  3 mL Intravenous Q12H  . tamsulosin  0.4 mg Oral Daily   Continuous Infusions:   Active Problems:   Hypotension   Tachycardia with sick sinus syndrome   GERD (gastroesophageal reflux disease)   Urinary retention   Elevated brain natriuretic peptide (BNP) level   Acute kidney injury   Congestive heart failure    Time spent: 30 min    [facility] Pager [phone]. If 7PM-7AM, please contact night-coverage at [email or web address], password [facility] 08/22/2013, 8:33 AM  LOS: 1 day

## 2013-08-22 NOTE — ED Provider Notes (Signed)
Medical screening examination/treatment/procedure(s) were conducted as a shared visit with non-physician practitioner(s) and myself.  I personally evaluated the patient during the encounter.   EKG Interpretation   Date/Time:  Thursday August 21 2013 16:04:49 EDT Ventricular Rate:  118 PR Interval:    QRS Duration: 154 QT Interval:  380 QTC Calculation: 532 R Axis:   -87 Text Interpretation:  Ventricular-paced rhythm Abnormal ECG Confirmed by  Dina Rich  MD, Jeanenne Licea ([id number]) on 08/21/2013 7:53:55 PM        Merryl Hacker, MD 08/22/13 [phone]

## 2013-10-01 ENCOUNTER — Encounter: Payer: Self-pay | Admitting: Internal Medicine

## 2013-10-01 ENCOUNTER — Ambulatory Visit (INDEPENDENT_AMBULATORY_CARE_PROVIDER_SITE_OTHER): Payer: Medicare Other | Admitting: Internal Medicine

## 2013-10-01 VITALS — BP 142/90 | HR 83 | Ht 71.0 in | Wt 207.0 lb

## 2013-10-01 DIAGNOSIS — I495 Sick sinus syndrome: Secondary | ICD-10-CM

## 2013-10-01 NOTE — Progress Notes (Signed)
PCP:  Dr Myna Bright with [facility] Primary Cardiologist: Dr Truman Hayward [facility])  Troy Petty is a 63 y.o. male with a h/o complete heart block and syncope sp PPM (SJM) by Dr Truman Hayward in [place] who presents today to establish care in the Electrophysiology device clinic.  He underwent PPM implant 03/14/13.  He did well until 4/15 when he presented to [facility] with fatigue and tachypalpitations.  He was found to have PMT.  I do not have any records of device interrogation however it appears that his device was interrogated and reprogrammed for PMT per the discharge summary.  He had a bedside echo (limited) by the ER physician and EF was said to be 45%, though an official echo was not performed.  He is now referred to me for further evaluation.  He states that Dr Truman Hayward is moving and he would like to establish device care with our practice. Today, he  denies symptoms of palpitations, chest pain, shortness of breath, orthopnea, PND, lower extremity edema, dizziness, presyncope, syncope, or neurologic sequela.  The patientis tolerating medications without difficulties and is otherwise without complaint today.   Past Medical History  Diagnosis Date  . Complete heart block   . Chronic renal insufficiency     baseline creatinine is 1.8  . GERD (gastroesophageal reflux disease)   . Chronic systolic dysfunction of left ventricle 4/15    EF 40-45% in setting of PMT on limited echo  . Hypertrophy of prostate with urinary obstruction and other lower urinary tract symptoms (LUTS)   . Chronic kidney disease, stage III (moderate)   . Vitamin B12 deficiency   . Hypertension   . Mild pulmonary hypertension   . Metabolic syndrome   . COPD (chronic obstructive pulmonary disease)    Past Surgical History  Procedure Laterality Date  . Pacemaker insertion  03/14/13    SJM Endurity implanted by Dr Truman Hayward in [place] for complete heart block  . Other surgical history        Intestinal blockage x2  . Other surgical history  1964    Football injury    History   Social History  . Marital Status: Married    Spouse Name: N/A    Number of Children: N/A  . Years of Education: N/A   Occupational History  . Not on file.   Social History Main Topics  . Smoking status: Former Smoker -- 40 years    Types: Cigarettes    Quit date: 05/02/2011  . Smokeless tobacco: Not on file  . Alcohol Use: No  . Drug Use: No  . Sexual Activity: Not on file   Other Topics Concern  . Not on file   Social History Narrative   Lives in [place] with spouse.    Family History  Problem Relation Age of Onset  . Cancer Father     No Known Allergies  Current Outpatient Prescriptions  Medication Sig Dispense Refill  . aspirin EC 81 MG tablet Take 1 tablet (81 mg total) by mouth daily.  30 tablet  0  . lisinopril (PRINIVIL,ZESTRIL) 2.5 MG tablet Take 2.5 mg by mouth daily.      . tamsulosin (FLOMAX) 0.4 MG CAPS capsule Take 0.4 mg by mouth daily.       No current facility-administered medications for this visit.    ROS- all systems are reviewed and negative except as per HPI  Physical Exam: Filed Vitals:   10/01/13 1538  BP: 142/90  Pulse: 83  Height: 5\' 11"  (1.803 m)  Weight: 207 lb (93.895 kg)    GEN- The patient is well appearing, alert and oriented x 3 today.   Head- normocephalic, atraumatic Eyes-  Sclera clear, conjunctiva pink Ears- hearing intact Oropharynx- clear Neck- supple, no JVP Lymph- no cervical lymphadenopathy Lungs- Clear to ausculation bilaterally, normal work of breathing Chest- pacemaker pocket is well healed Heart- Regular rate and rhythm, no murmurs, rubs or gallops, PMI not laterally displaced GI- soft, NT, ND, + BS Extremities- no clubbing, cyanosis, or edema MS- no significant deformity or atrophy Skin- no rash or lesion,  Psych- euthymic mood, full affect Neuro- strength and sensation are intact  Pacemaker  interrogation- reviewed in detail today,  See PACEART report More than 20 pages of records from [place] are reviewed Epic records from recent admission are reviewed  Assessment and Plan:  1. Complete heart block Normal pacemaker function See Pace Art report No changes today He appears to have been recently reprogrammed because of PMT.  We will watch for this going forward  2. ? Reduced EF Recent bedside echo by EF staff in setting of PMT is said to have found EF of 45%.  An official echo has not been done.  I will therefore obtain an echo.  No medicine changes are planned presently. I have reviewed records from [place] which reveal previously normal EF If EF is depressed, I will refer to Dr Harl Bowie in [place] for medical management.  Merlin I will see in the [facility] device clinic in 1 year He will contact my office if problems arise in the interim

## 2013-10-01 NOTE — Patient Instructions (Addendum)
Remote monitoring is used to monitor your pacemaker from home. This monitoring reduces the number of office visits required to check your device to one time per year. It allows [place] to keep an eye on the functioning of your device to ensure it is working properly. You are scheduled for a device check from home on 01-06-2014. You may send your transmission at any time that day. If you have a wireless device, the transmission will be sent automatically. After your physician reviews your transmission, you will receive a postcard with your next transmission date.  Your physician recommends that you schedule a follow-up appointment in: 12 months with Dr.Allred in [place] has requested that you have an echocardiogram. Echocardiography is a painless test that uses sound waves to create images of your heart. It provides your doctor with information about the size and shape of your heart and how well your heart's chambers and valves are working. This procedure takes approximately one hour. There are no restrictions for this procedure.

## 2013-10-21 ENCOUNTER — Other Ambulatory Visit (INDEPENDENT_AMBULATORY_CARE_PROVIDER_SITE_OTHER): Payer: Medicare Other

## 2013-10-21 ENCOUNTER — Other Ambulatory Visit: Payer: Self-pay

## 2013-10-21 DIAGNOSIS — I495 Sick sinus syndrome: Secondary | ICD-10-CM

## 2013-10-21 DIAGNOSIS — I059 Rheumatic mitral valve disease, unspecified: Secondary | ICD-10-CM

## 2013-11-04 ENCOUNTER — Telehealth: Payer: Self-pay | Admitting: *Deleted

## 2013-11-04 NOTE — Telephone Encounter (Signed)
Message copied by Laurine Blazer on Tue Nov 04, 2013  4:35 PM ------      Message from: [place] F      Created: Mon Oct 27, 2013  4:41 PM       Please pt up to see me in next few weeks            J Branch      ----- Message -----         From: Thompson Grayer, MD         Sent: 10/25/2013   9:01 PM           To: Dionicio Stall, RN, Arnoldo Lenis, MD            Results reviewed.  Claiborne Billings, Please inform pt of result.      As his EF is mildly depressed, per my recent office note, I will refer to Dr Harl Bowie for further evaluation and management.       ------

## 2013-11-04 NOTE — Telephone Encounter (Signed)
Notes Recorded by Laurine Blazer, LPN on 06/03/7626 at 3:15 PM Patient notified. Appointment scheduled with Dr. Harl Bowie for 11/17/2013. ------ Notes Recorded by Stanton Kidney, RN on 10/29/2013 at 11:08 AM Home voicemail full and will not let me leave a message ------

## 2013-11-08 ENCOUNTER — Encounter (HOSPITAL_COMMUNITY): Payer: Self-pay | Admitting: Emergency Medicine

## 2013-11-08 DIAGNOSIS — Z950 Presence of cardiac pacemaker: Secondary | ICD-10-CM | POA: Diagnosis not present

## 2013-11-08 DIAGNOSIS — Z7982 Long term (current) use of aspirin: Secondary | ICD-10-CM | POA: Insufficient documentation

## 2013-11-08 DIAGNOSIS — Z87891 Personal history of nicotine dependence: Secondary | ICD-10-CM | POA: Insufficient documentation

## 2013-11-08 DIAGNOSIS — N138 Other obstructive and reflux uropathy: Secondary | ICD-10-CM | POA: Insufficient documentation

## 2013-11-08 DIAGNOSIS — Z79899 Other long term (current) drug therapy: Secondary | ICD-10-CM | POA: Insufficient documentation

## 2013-11-08 DIAGNOSIS — N401 Enlarged prostate with lower urinary tract symptoms: Secondary | ICD-10-CM | POA: Insufficient documentation

## 2013-11-08 DIAGNOSIS — Z862 Personal history of diseases of the blood and blood-forming organs and certain disorders involving the immune mechanism: Secondary | ICD-10-CM | POA: Insufficient documentation

## 2013-11-08 DIAGNOSIS — N183 Chronic kidney disease, stage 3 unspecified: Secondary | ICD-10-CM | POA: Diagnosis not present

## 2013-11-08 DIAGNOSIS — Z8639 Personal history of other endocrine, nutritional and metabolic disease: Secondary | ICD-10-CM | POA: Insufficient documentation

## 2013-11-08 DIAGNOSIS — I10 Essential (primary) hypertension: Secondary | ICD-10-CM | POA: Diagnosis present

## 2013-11-08 DIAGNOSIS — J449 Chronic obstructive pulmonary disease, unspecified: Secondary | ICD-10-CM | POA: Diagnosis not present

## 2013-11-08 DIAGNOSIS — J4489 Other specified chronic obstructive pulmonary disease: Secondary | ICD-10-CM | POA: Insufficient documentation

## 2013-11-08 DIAGNOSIS — I129 Hypertensive chronic kidney disease with stage 1 through stage 4 chronic kidney disease, or unspecified chronic kidney disease: Secondary | ICD-10-CM | POA: Insufficient documentation

## 2013-11-08 DIAGNOSIS — Z8719 Personal history of other diseases of the digestive system: Secondary | ICD-10-CM | POA: Diagnosis not present

## 2013-11-08 NOTE — ED Notes (Signed)
Pt reports blood pressure keeps going up even with taking blood pressure medication.  No symptoms.  Pt was told to monitor bp and weight

## 2013-11-09 ENCOUNTER — Emergency Department (HOSPITAL_COMMUNITY)
Admission: EM | Admit: 2013-11-09 | Discharge: 2013-11-09 | Disposition: A | Discharge status: Home / Self Care | Payer: Medicare Other | Attending: Emergency Medicine | Admitting: Emergency Medicine

## 2013-11-09 DIAGNOSIS — I10 Essential (primary) hypertension: Secondary | ICD-10-CM

## 2013-11-09 NOTE — Discharge Instructions (Signed)
Hypertension Hypertension, commonly called high blood pressure, is when the force of blood pumping through your arteries is too strong. Your arteries are the blood vessels that carry blood from your heart throughout your body. A blood pressure reading consists of a higher number over a lower number, such as 110/72. The higher number (systolic) is the pressure inside your arteries when your heart pumps. The lower number (diastolic) is the pressure inside your arteries when your heart relaxes. Ideally you want your blood pressure below 120/80. Hypertension forces your heart to work harder to pump blood. Your arteries may become narrow or stiff. Having hypertension puts you at risk for heart disease, stroke, and other problems.  RISK FACTORS Some risk factors for high blood pressure are controllable. Others are not.  Risk factors you cannot control include:   Race. You may be at higher risk if you are African American.  Age. Risk increases with age.  Gender. Men are at higher risk than women before age 45 years. After age 65, women are at higher risk than men. Risk factors you can control include:  Not getting enough exercise or physical activity.  Being overweight.  Getting too much fat, sugar, calories, or salt in your diet.  Drinking too much alcohol. SIGNS AND SYMPTOMS Hypertension does not usually cause signs or symptoms. Extremely high blood pressure (hypertensive crisis) may cause headache, anxiety, shortness of breath, and nosebleed. DIAGNOSIS  To check if you have hypertension, your health care provider will measure your blood pressure while you are seated, with your arm held at the level of your heart. It should be measured at least twice using the same arm. Certain conditions can cause a difference in blood pressure between your right and left arms. A blood pressure reading that is higher than normal on one occasion does not mean that you need treatment. If one blood pressure reading  is high, ask your health care provider about having it checked again. TREATMENT  Treating high blood pressure includes making lifestyle changes and possibly taking medication. Living a healthy lifestyle can help lower high blood pressure. You may need to change some of your habits. Lifestyle changes may include:  Following the DASH diet. This diet is high in fruits, vegetables, and whole grains. It is low in salt, red meat, and added sugars.  Getting at least 2 1/2 hours of brisk physical activity every week.  Losing weight if necessary.  Not smoking.  Limiting alcoholic beverages.  Learning ways to reduce stress. If lifestyle changes are not enough to get your blood pressure under control, your health care provider may prescribe medicine. You may need to take more than one. Work closely with your health care provider to understand the risks and benefits. HOME CARE INSTRUCTIONS  Have your blood pressure rechecked as directed by your health care provider.   Only take medicine as directed by your health care provider. Follow the directions carefully. Blood pressure medicines must be taken as prescribed. The medicine does not work as well when you skip doses. Skipping doses also puts you at risk for problems.   Do not smoke.   Monitor your blood pressure at home as directed by your health care provider. SEEK MEDICAL CARE IF:   You think you are having a reaction to medicines taken.  You have recurrent headaches or feel dizzy.  You have swelling in your ankles.  You have trouble with your vision. SEEK IMMEDIATE MEDICAL CARE IF:  You develop a severe headache or   confusion.  You have unusual weakness, numbness, or feel faint.  You have severe chest or abdominal pain.  You vomit repeatedly.  You have trouble breathing. MAKE SURE YOU:   Understand these instructions.  Will watch your condition.  Will get help right away if you are not doing well or get  worse. Document Released: 04/17/2005 Document Revised: 04/22/2013 Document Reviewed: 02/07/2013 Exit[employer] Patient Information 2015 [employer]. This information is not intended to replace advice given to you by your health care provider. Make sure you discuss any questions you have with your health care provider.  

## 2013-11-09 NOTE — ED Provider Notes (Signed)
CSN: [id number]     Arrival date & time 11/08/13  2110 History   First MD Initiated Contact with Patient 11/09/13 0051     Chief Complaint  Patient presents with  . Hypertension      Patient is a 63 y.o. male presenting with hypertension. The history is provided by the patient.  Hypertension This is a chronic problem. The current episode started more than 1 week ago. The problem occurs daily. The problem has been gradually worsening. Pertinent negatives include no chest pain, no abdominal pain, no headaches and no shortness of breath. Nothing aggravates the symptoms.  pt reports  Increased BP this past week despite med compliance He was recently decreased to lisinopril 2.5mg .   Today his BP was elevated and he took old dose of lisinopril (40mg ) No change in his BP  He has no complaints at this time   Past Medical History  Diagnosis Date  . Complete heart block   . Chronic renal insufficiency     baseline creatinine is 1.8  . GERD (gastroesophageal reflux disease)   . Chronic systolic dysfunction of left ventricle 4/15    EF 40-45% in setting of PMT on limited echo  . Hypertrophy of prostate with urinary obstruction and other lower urinary tract symptoms (LUTS)   . Chronic kidney disease, stage III (moderate)   . Vitamin B12 deficiency   . Hypertension   . Mild pulmonary hypertension   . Metabolic syndrome   . COPD (chronic obstructive pulmonary disease)    Past Surgical History  Procedure Laterality Date  . Pacemaker insertion  03/14/13    SJM Endurity implanted by Dr Truman Hayward in [place] for complete heart block  . Other surgical history      Intestinal blockage x2  . Other surgical history  1964    Football injury   Family History  Problem Relation Age of Onset  . Cancer Father    History  Substance Use Topics  . Smoking status: Former Smoker -- 40 years    Types: Cigarettes    Quit date: 05/02/2011  . Smokeless tobacco: Not on file  . Alcohol Use: No     Review of Systems  Constitutional: Negative for fever.  Respiratory: Negative for shortness of breath.   Cardiovascular: Negative for chest pain.  Gastrointestinal: Negative for vomiting and abdominal pain.  Neurological: Negative for weakness, numbness and headaches.  All other systems reviewed and are negative.     Allergies  Review of patient's allergies indicates no known allergies.  Home Medications   Prior to Admission medications   Medication Sig Start Date End Date Taking? Authorizing Provider  aspirin EC 81 MG tablet Take 1 tablet (81 mg total) by mouth daily. 08/22/13   Janece Canterbury, MD  lisinopril (PRINIVIL,ZESTRIL) 2.5 MG tablet Take 2.5 mg by mouth daily.    Historical Provider, MD  tamsulosin (FLOMAX) 0.4 MG CAPS capsule Take 0.4 mg by mouth daily.    Historical Provider, MD   BP 181/100  Pulse 60  Temp(Src) 98 F (36.7 C) (Oral)  Resp 16  SpO2 98% Physical Exam CONSTITUTIONAL: Well developed/well nourished HEAD: Normocephalic/atraumatic EYES: EOMI/PERRL ENMT: Mucous membranes moist NECK: supple no meningeal signs CV: S1/S2 noted, no murmurs/rubs/gallops noted LUNGS: Lungs are clear to auscultation bilaterally, no apparent distress ABDOMEN: soft, nontender, no rebound or guarding GU:no cva tenderness NEURO: Pt is awake/alert, moves all extremitiesx4 No facial droop No arm/leg drift EXTREMITIES: pulses normal, full ROM SKIN: warm, color normal PSYCH: no  abnormalities of mood noted  ED Course  Procedures\  2:28 AM Pt asymptomatic, well appearing Advised not to take xtra meds He is to go back to his dosing and call his PCP in 24 hours for further guidance I don't feel further testing required I would not add any other BP meds at this time   MDM   Final diagnoses:  Essential hypertension    Nursing notes including past medical history and social history reviewed and considered in documentation Previous records reviewed and  considered     Sharyon Cable, MD 11/09/13 [phone]

## 2013-11-17 ENCOUNTER — Ambulatory Visit (INDEPENDENT_AMBULATORY_CARE_PROVIDER_SITE_OTHER): Payer: Medicare Other | Admitting: Cardiology

## 2013-11-17 ENCOUNTER — Encounter: Payer: Self-pay | Admitting: Cardiology

## 2013-11-17 ENCOUNTER — Encounter: Payer: Self-pay | Admitting: *Deleted

## 2013-11-17 VITALS — BP 147/87 | HR 78 | Ht 71.0 in | Wt 204.0 lb

## 2013-11-17 DIAGNOSIS — I10 Essential (primary) hypertension: Secondary | ICD-10-CM

## 2013-11-17 DIAGNOSIS — N189 Chronic kidney disease, unspecified: Secondary | ICD-10-CM

## 2013-11-17 DIAGNOSIS — I2510 Atherosclerotic heart disease of native coronary artery without angina pectoris: Secondary | ICD-10-CM

## 2013-11-17 DIAGNOSIS — I5020 Unspecified systolic (congestive) heart failure: Secondary | ICD-10-CM

## 2013-11-17 DIAGNOSIS — I509 Heart failure, unspecified: Secondary | ICD-10-CM

## 2013-11-17 DIAGNOSIS — Z79899 Other long term (current) drug therapy: Secondary | ICD-10-CM

## 2013-11-17 MED ORDER — CARVEDILOL 12.5 MG PO TABS: 12.5000 mg | ORAL_TABLET | Freq: Two times a day (BID) | ORAL | Status: DC

## 2013-11-17 NOTE — Patient Instructions (Signed)
   Increase Coreg to 12.5mg  twice a day  - new sent to pharm Continue all other medications.   Your physician has requested that you regularly monitor and record your blood pressure readings at home. Please take approximately 2 hours after medication x 2 weeks & return to office for MD review. Labs for CMET, CBC, FLP  Office will contact with results via phone or letter.   Follow up in  3 months

## 2013-11-17 NOTE — Progress Notes (Signed)
Clinical Summary Troy Petty is a 63 y.o.male seen today for follow up of the following medical problems.  1. Low normal to mildly decreased LV systolic function - echo 05/7614 LVEF 07-37%, grade I diastolic dysfunction. Apex appears to be hypokinetic though poor visualization - prior cath in [facility] 10/2012 with only mid LAD 20% lesion, otherwise patent vessels.   Has some DOE, though can walk 45 min to 1 hr without troubles at a regular pace. No LE edema, no orthopnea, no PND. No chest pain - compliant with meds  2. 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 Complete heart block - [employer] Jude pacemaker followed by Dr Rayann Heman - normal function by last check 09/2013  4. CKD - followed by pcp - baseline Cr appears to be around 2, stable  5. HTN - checks bp at home twice daily, typically around 140-150s/90s. - compliant with meds   Past Medical History  Diagnosis Date  . Complete heart block   . Chronic renal insufficiency     baseline creatinine is 1.8  . GERD (gastroesophageal reflux disease)   . Chronic systolic dysfunction of left ventricle 4/15    EF 40-45% in setting of PMT on limited echo  . Hypertrophy of prostate with urinary obstruction and other lower urinary tract symptoms (LUTS)   . Chronic kidney disease, stage III (moderate)   . Vitamin B12 deficiency   . Hypertension   . Mild pulmonary hypertension   . Metabolic syndrome   . COPD (chronic obstructive pulmonary disease)      No Known Allergies   Current Outpatient Prescriptions  Medication Sig Dispense Refill  . aspirin EC 81 MG tablet Take 1 tablet (81 mg total) by mouth daily.  30 tablet  0  . lisinopril (PRINIVIL,ZESTRIL) 2.5 MG tablet Take 2.5 mg by mouth daily.      . tamsulosin (FLOMAX) 0.4 MG CAPS capsule Take 0.4 mg by mouth daily.       No current facility-administered medications for this visit.     Past Surgical History  Procedure  Laterality Date  . Pacemaker insertion  03/14/13    SJM Endurity implanted by Dr Truman Hayward in [place] for complete heart block  . Other surgical history      Intestinal blockage x2  . Other surgical history  1964    Football injury     No Known Allergies    Family History  Problem Relation Age of Onset  . Cancer Father      Social History Troy Petty reports that he quit smoking about 2 years ago. His smoking use included Cigarettes. He smoked 0.00 packs per day for 40 years. He does not have any smokeless tobacco history on file. Troy Petty reports that he does not drink alcohol.   Review of Systems CONSTITUTIONAL: No weight loss, fever, chills, weakness or fatigue.  HEENT: Eyes: No visual loss, blurred vision, double vision or yellow sclerae.No hearing loss, sneezing, congestion, runny nose or sore throat.  SKIN: No rash or itching.  CARDIOVASCULAR: per HPI RESPIRATORY: No shortness of breath, cough or sputum.  GASTROINTESTINAL: No anorexia, nausea, vomiting or diarrhea. No abdominal pain or blood.  GENITOURINARY: No burning on urination, no polyuria NEUROLOGICAL: No headache, dizziness, syncope, paralysis, ataxia, numbness or tingling in the extremities. No change in bowel or bladder control.  MUSCULOSKELETAL: No muscle, back pain, joint pain or stiffness.  LYMPHATICS: No enlarged nodes. No history  of splenectomy.  PSYCHIATRIC: No history of depression or anxiety.  ENDOCRINOLOGIC: No reports of sweating, cold or heat intolerance. No polyuria or polydipsia.  Marland Kitchen   Physical Examination p 78 bp 147/87 Wt 204 lbs BMI 28 Gen: resting comfortably, no acute distress HEENT: no scleral icterus, pupils equal round and reactive, no palptable cervical adenopathy,  CV: RRR, no m/r/g, no JVD, no carotid bruits Resp: Clear to auscultation bilaterally GI: abdomen is soft, non-tender, non-distended, normal bowel sounds, no hepatosplenomegaly MSK: extremities are warm, no edema.    Skin: warm, no rash Neuro:  no focal deficits Psych: appropriate affect   Diagnostic Studies 09/2013 Echo Study Conclusions  - Left ventricle: The cavity size was normal. Wall thickness was normal. Systolic function was mildly reduced. The estimated ejection fraction was in the range of 45% to 50%. Doppler parameters are consistent with abnormal left ventricular relaxation (grade 1 diastolic dysfunction). - Regional wall motion abnormality: Several of the views are foreshortened, however overall the apex does appear hypokinetic. Hypokinesis of the apical anterior, apical inferior, apical septal, apical lateral, and apical myocardium. Can consider echo with contrast for more definitive evaluation of wall motion. - Aortic valve: Mildly calcified annulus. Trileaflet; mildly thickened leaflets. Valve area (VTI): 3.76 cm^2. Valve area (Vmax): 3.65 cm^2. - Mitral valve: Mildly calcified annulus. Mildly thickened leaflets . 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 - Systemic veins: The IVC is small, suggesting low RA pressures and hypovolemia.  10/2012 Cath [facility]: RA 5, PA 40/12 (mean not reported) PCWP 12, PA sat 75%.  LHC: LM patent, LAD mid 20%, LCX patent, RCA anomolous origine from left cusp patent. LVEF 70%.     Assessment and Plan  1. Low normal to mildly decreased LV systolic function - echo 05/6107 LVEF 45-50%, prior cath 2014 with patent coronaries - fairly mild symptoms, typically with higher levels of exertion - will increase coreg to 12.5mg  bid  2. Non-obstructive CAD - cath with mild LAD lesion, denies any symptoms - aggressive risk factor modification, will check lipid panel as he is not on a statin. I would treat LDL to less than 70.   3. CKD - per pcp  4. HTN - not at goal given hx of CKD, goal bp <130/80 - increase coreg to 12.5mg  bid - he is to keep bp log and drop off in 2 weeks    F/u 3 months   Arnoldo Lenis, M.D., F.A.C.C.

## 2013-11-17 NOTE — Progress Notes (Signed)
CX coreg in family pharmacy in [place], and called coreg into [facility] [place] per pt. Also updated in preferred pharmacy

## 2013-11-25 ENCOUNTER — Telehealth: Payer: Self-pay | Admitting: *Deleted

## 2013-11-25 NOTE — Telephone Encounter (Signed)
Patient stated that recent increase on Coreg from 6.25 to 12.5mg  is making him sick.  No diarrhea or vomiting, only nausea.  Stated he did see Dr. Elvina Mattes (PMD) today & he suggested decreasing back to 6.25mg , but to check with [place] first.  Stated he did not take any this morning & is already feeling a little better.  Please advise.  Is already monitoring BP & doing a log x 2 weeks per last office note.

## 2013-11-26 MED ORDER — CARVEDILOL 12.5 MG PO TABS: 6.2500 mg | ORAL_TABLET | Freq: Two times a day (BID) | ORAL | Status: DC

## 2013-11-26 NOTE — Telephone Encounter (Signed)
Ok to decrease coreg back to 6.25mg  bid

## 2013-11-26 NOTE — Telephone Encounter (Signed)
Patient notified

## 2013-12-03 ENCOUNTER — Telehealth: Payer: Self-pay | Admitting: *Deleted

## 2013-12-03 NOTE — Telephone Encounter (Signed)
Patient came by office to drop off log of BP readings (placed in your box for review).  He also brought his home monitor in for comparison & ask [place] to check.    129/79  70  - our monitor  133/86  70  - patient monitor   Patient c/o Coreg making him feel fatigued & nausea.  Stated that he is usually able to tolerate medication without difficulty.  Patient feels confident that this medication is the cause.

## 2013-12-04 MED ORDER — CARVEDILOL 3.125 MG PO TABS: 3.1250 mg | ORAL_TABLET | Freq: Two times a day (BID) | ORAL | Status: DC

## 2013-12-04 MED ORDER — AMLODIPINE BESYLATE 10 MG PO TABS: 10.0000 mg | ORAL_TABLET | Freq: Every day | ORAL | Status: DC

## 2013-12-04 NOTE — Telephone Encounter (Signed)
Wife Parke Simmers) notified.  Will send new rx to [facility] in [place], [place] for Coreg 3.125mg  twice a day & Amlodipine 10mg  daily.  Will have them continue to keep BP log over next several weeks & return to MD for review.  Also, advised to call back if no improvement.  Wife verbalized understanding.

## 2013-12-04 NOTE — Telephone Encounter (Signed)
Lets try cutting back coreg one more time to 3.125mg  bid. I'd like to try to keep him on some if he can tolerate it as it is good for his heart function. Varify his norvasc is 5mg  daily, and would increase to 10mg  daily since we are decreasing coreg and his bp is at upper level of target.   Zandra Abts MD

## 2013-12-16 ENCOUNTER — Telehealth: Payer: Self-pay | Admitting: Cardiology

## 2013-12-16 NOTE — Telephone Encounter (Signed)
Patient wife wants to know if he could try the brand name instead of the generic or is there any other suggestions.

## 2013-12-17 NOTE — Telephone Encounter (Signed)
Note received about continued symptoms despite decreasing coreg. I don't believe name brand vs generic would make a difference. I would stop coreg for one week and see how he feels. If symptoms resolved then we know its the medication, and we could consider low dose Toprol instead. Cathy please have him stop medicine and call us back in 1 week.   Zandra Abts MD

## 2013-12-18 NOTE — Telephone Encounter (Signed)
lmtcb 8/20

## 2013-12-18 NOTE — Telephone Encounter (Signed)
I spoke with pt,he will stop Coreg for 1 week and I will call him on 12/25/13 and see how he is feeling

## 2013-12-30 ENCOUNTER — Telehealth: Payer: Self-pay

## 2013-12-30 NOTE — Telephone Encounter (Signed)
Patient taken off of coreg for just over a week, see 12/16/13 note -remains off Coreg He feels no better,decribes weakness and nausea (esp after eating).Had labs drawn ordered by you at [facility] but has not heard results yet. I asked him to speak with staff at [facility] office and that I would relay message to you

## 2013-12-31 NOTE — Telephone Encounter (Signed)
Patient informed and said he saw his PCP today and is being treated for acid reflux. Patient said he is going to wait a while to see if his new treatment plan helps his symptoms and if so, he will restart the coreg.

## 2013-12-31 NOTE — Telephone Encounter (Signed)
Troy Petty, please look into his lab results as we have not gotten them back from [facility]. His symptoms have been going on since late July, and have continued since being off coreg. There is something else going on. He needs to f/u with his primary again. His fatigue and nausea are not cardiac, and don't appear to be related to the coreg since continued after being completely off   Zandra Abts MD

## 2014-01-01 ENCOUNTER — Encounter (HOSPITAL_COMMUNITY): Payer: Self-pay | Admitting: Emergency Medicine

## 2014-01-01 ENCOUNTER — Telehealth: Payer: Self-pay | Admitting: Cardiology

## 2014-01-01 ENCOUNTER — Inpatient Hospital Stay (HOSPITAL_COMMUNITY): Payer: Medicare Other

## 2014-01-01 ENCOUNTER — Inpatient Hospital Stay (HOSPITAL_COMMUNITY)
Admission: EM | Admit: 2014-01-01 | Discharge: 2014-01-04 | DRG: 699 | Disposition: A | Discharge status: Home / Self Care | Payer: Medicare Other | Attending: Internal Medicine | Admitting: Internal Medicine

## 2014-01-01 DIAGNOSIS — Z87891 Personal history of nicotine dependence: Secondary | ICD-10-CM

## 2014-01-01 DIAGNOSIS — D649 Anemia, unspecified: Secondary | ICD-10-CM

## 2014-01-01 DIAGNOSIS — I2510 Atherosclerotic heart disease of native coronary artery without angina pectoris: Secondary | ICD-10-CM | POA: Diagnosis present

## 2014-01-01 DIAGNOSIS — N183 Chronic kidney disease, stage 3 unspecified: Secondary | ICD-10-CM | POA: Diagnosis present

## 2014-01-01 DIAGNOSIS — R339 Retention of urine, unspecified: Secondary | ICD-10-CM | POA: Diagnosis present

## 2014-01-01 DIAGNOSIS — E8881 Metabolic syndrome: Secondary | ICD-10-CM | POA: Diagnosis present

## 2014-01-01 DIAGNOSIS — J449 Chronic obstructive pulmonary disease, unspecified: Secondary | ICD-10-CM | POA: Diagnosis present

## 2014-01-01 DIAGNOSIS — K219 Gastro-esophageal reflux disease without esophagitis: Secondary | ICD-10-CM | POA: Diagnosis present

## 2014-01-01 DIAGNOSIS — N1330 Unspecified hydronephrosis: Secondary | ICD-10-CM | POA: Diagnosis present

## 2014-01-01 DIAGNOSIS — I129 Hypertensive chronic kidney disease with stage 1 through stage 4 chronic kidney disease, or unspecified chronic kidney disease: Secondary | ICD-10-CM | POA: Diagnosis present

## 2014-01-01 DIAGNOSIS — N189 Chronic kidney disease, unspecified: Secondary | ICD-10-CM

## 2014-01-01 DIAGNOSIS — N179 Acute kidney failure, unspecified: Secondary | ICD-10-CM | POA: Diagnosis present

## 2014-01-01 DIAGNOSIS — Z7982 Long term (current) use of aspirin: Secondary | ICD-10-CM

## 2014-01-01 DIAGNOSIS — I428 Other cardiomyopathies: Secondary | ICD-10-CM | POA: Diagnosis present

## 2014-01-01 DIAGNOSIS — I442 Atrioventricular block, complete: Secondary | ICD-10-CM | POA: Diagnosis present

## 2014-01-01 DIAGNOSIS — N320 Bladder-neck obstruction: Secondary | ICD-10-CM | POA: Diagnosis present

## 2014-01-01 DIAGNOSIS — I2789 Other specified pulmonary heart diseases: Secondary | ICD-10-CM | POA: Diagnosis present

## 2014-01-01 DIAGNOSIS — Z950 Presence of cardiac pacemaker: Secondary | ICD-10-CM | POA: Diagnosis not present

## 2014-01-01 DIAGNOSIS — J4489 Other specified chronic obstructive pulmonary disease: Secondary | ICD-10-CM | POA: Diagnosis present

## 2014-01-01 DIAGNOSIS — N138 Other obstructive and reflux uropathy: Secondary | ICD-10-CM | POA: Diagnosis present

## 2014-01-01 DIAGNOSIS — N401 Enlarged prostate with lower urinary tract symptoms: Secondary | ICD-10-CM | POA: Diagnosis present

## 2014-01-01 DIAGNOSIS — I10 Essential (primary) hypertension: Secondary | ICD-10-CM | POA: Diagnosis present

## 2014-01-01 LAB — COMPREHENSIVE METABOLIC PANEL
ALK PHOS: 59 U/L (ref 39–117)
ALT: 16 U/L (ref 0–53)
AST: 13 U/L (ref 0–37)
Albumin: 3.5 g/dL (ref 3.5–5.2)
Anion gap: 13 (ref 5–15)
BUN: 24 mg/dL — AB (ref 6–23)
CO2: 19 mEq/L (ref 19–32)
Calcium: 8.6 mg/dL (ref 8.4–10.5)
Chloride: 110 mEq/L (ref 96–112)
Creatinine, Ser: 3.78 mg/dL — ABNORMAL HIGH (ref 0.50–1.35)
GFR, EST AFRICAN AMERICAN: 18 mL/min — AB (ref >90)
GFR, EST NON AFRICAN AMERICAN: 15 mL/min — AB (ref >90)
GLUCOSE: 109 mg/dL — AB (ref 70–99)
POTASSIUM: 4.8 meq/L (ref 3.7–5.3)
Sodium: 142 mEq/L (ref 137–147)
TOTAL PROTEIN: 7.6 g/dL (ref 6.0–8.3)

## 2014-01-01 LAB — URINALYSIS, ROUTINE W REFLEX MICROSCOPIC
BILIRUBIN URINE: NEGATIVE
GLUCOSE, UA: NEGATIVE mg/dL
Hgb urine dipstick: NEGATIVE
KETONES UR: NEGATIVE mg/dL
LEUKOCYTES UA: NEGATIVE
Nitrite: NEGATIVE
PH: 5.5 (ref 5.0–8.0)
Protein, ur: NEGATIVE mg/dL
Specific Gravity, Urine: 1.009 (ref 1.005–1.030)
Urobilinogen, UA: 0.2 mg/dL (ref 0.0–1.0)

## 2014-01-01 LAB — CBC
HEMATOCRIT: 35.3 % — AB (ref 39.0–52.0)
HEMOGLOBIN: 11.6 g/dL — AB (ref 13.0–17.0)
MCH: 27.8 pg (ref 26.0–34.0)
MCHC: 32.9 g/dL (ref 30.0–36.0)
MCV: 84.7 fL (ref 78.0–100.0)
Platelets: 273 10*3/uL (ref 150–400)
RBC: 4.17 MIL/uL — ABNORMAL LOW (ref 4.22–5.81)
RDW: 13.7 % (ref 11.5–15.5)
WBC: 8.3 10*3/uL (ref 4.0–10.5)

## 2014-01-01 LAB — SODIUM, URINE, RANDOM: SODIUM UR: 59 meq/L

## 2014-01-01 LAB — CREATININE, URINE, RANDOM: Creatinine, Urine: 88.77 mg/dL

## 2014-01-01 MED ORDER — ENOXAPARIN SODIUM 30 MG/0.3ML ~~LOC~~ SOLN
30.0000 mg | SUBCUTANEOUS | Status: DC
  Administered 2014-01-01: 30 mg via SUBCUTANEOUS
  Filled 2014-01-01 (×2): qty 0.3

## 2014-01-01 MED ORDER — ONDANSETRON HCL 4 MG/2ML IJ SOLN: 4.0000 mg | Freq: Four times a day (QID) | INTRAMUSCULAR | Status: DC | PRN

## 2014-01-01 MED ORDER — PANTOPRAZOLE SODIUM 40 MG PO TBEC
40.0000 mg | DELAYED_RELEASE_TABLET | Freq: Every day | ORAL | Status: DC
  Administered 2014-01-02 – 2014-01-04 (×3): 40 mg via ORAL
  Filled 2014-01-01 (×3): qty 1

## 2014-01-01 MED ORDER — AMLODIPINE BESYLATE 10 MG PO TABS
10.0000 mg | ORAL_TABLET | Freq: Every day | ORAL | Status: DC
  Administered 2014-01-02 – 2014-01-04 (×3): 10 mg via ORAL
  Filled 2014-01-01 (×3): qty 1

## 2014-01-01 MED ORDER — ASPIRIN EC 81 MG PO TBEC
81.0000 mg | DELAYED_RELEASE_TABLET | Freq: Every day | ORAL | Status: DC
  Administered 2014-01-02 – 2014-01-04 (×3): 81 mg via ORAL
  Filled 2014-01-01 (×3): qty 1

## 2014-01-01 MED ORDER — TAMSULOSIN HCL 0.4 MG PO CAPS
0.4000 mg | ORAL_CAPSULE | Freq: Every day | ORAL | Status: DC
  Administered 2014-01-01 – 2014-01-03 (×3): 0.4 mg via ORAL
  Filled 2014-01-01 (×4): qty 1

## 2014-01-01 MED ORDER — ONDANSETRON HCL 4 MG PO TABS: 4.0000 mg | ORAL_TABLET | Freq: Four times a day (QID) | ORAL | Status: DC | PRN

## 2014-01-01 MED ORDER — SODIUM CHLORIDE 0.9 % IV BOLUS (SEPSIS)
1000.0000 mL | Freq: Once | INTRAVENOUS | Status: AC
  Administered 2014-01-01: 1000 mL via INTRAVENOUS

## 2014-01-01 MED ORDER — SODIUM CHLORIDE 0.9 % IV SOLN
INTRAVENOUS | Status: DC
  Administered 2014-01-01 – 2014-01-02 (×3): via INTRAVENOUS
  Administered 2014-01-02: 100 mL via INTRAVENOUS
  Administered 2014-01-03 – 2014-01-04 (×2): via INTRAVENOUS

## 2014-01-01 NOTE — Progress Notes (Signed)
NURSING PROGRESS NOTE  Troy Petty [id number] Admission Data: 01/01/2014 6:56 PM Attending Provider: Hosie Poisson, MD KJZ:PHXTAV,WPVX PATRICK, DO Code Status: FULL   Troy Petty is a 63 y.o. male patient admitted from ED:  -No acute distress noted.  -No complaints of shortness of breath.  -No complaints of chest pain.    Blood pressure 151/90, pulse 75, temperature 98.9 F (37.2 C), temperature source Oral, resp. rate 18, height 5\' 11"  (1.803 m), weight 91.173 kg (201 lb), SpO2 99.00%.   IV Fluids:  IV in place, occlusive dsg intact without redness, IV cath antecubital right, condition patent and no redness normal saline.   Allergies:  Review of patient's allergies indicates no known allergies.  Past Medical History:   has a past medical history of Complete heart block; Chronic renal insufficiency; GERD (gastroesophageal reflux disease); Chronic systolic dysfunction of left ventricle (4/15); Hypertrophy of prostate with urinary obstruction and other lower urinary tract symptoms (LUTS); Chronic kidney disease, stage III (moderate); Vitamin B12 deficiency; Hypertension; Mild pulmonary hypertension; Metabolic syndrome; and COPD (chronic obstructive pulmonary disease).  Past Surgical History:   has past surgical history that includes Pacemaker insertion (03/14/13); Other surgical history; and Other surgical history (1964).   Skin: intact  Patient/Family orientated to room. Information packet given to patient/family. Admission inpatient armband information verified with patient/family to include name and date of birth and placed on patient arm. Side rails up x 2, fall assessment and education completed with patient/family. Patient/family able to verbalize understanding of risk associated with falls and verbalized understanding to call for assistance before getting out of bed. Call light within reach. Patient/family able to voice and demonstrate understanding of unit orientation  instructions.    Will continue to evaluate and treat per MD orders.  Wallie Renshaw, RN

## 2014-01-01 NOTE — ED Provider Notes (Signed)
CSN: [id number]     Arrival date & time 01/01/14  1302 History   First MD Initiated Contact with Patient 01/01/14 1505     Chief Complaint  Patient presents with  . Abnormal Lab    The patient said his MD's office called and told him to come to the ED because his creatinine was elevated at 3.59.  The patient denies any symptoms including pain.     (Consider location/radiation/quality/duration/timing/severity/associated sxs/prior Treatment) HPI Comments: Patient is a 63 year old male with history of complete heart block with pacemaker, chronic renal insufficiency with a baseline creatinine of 1.9, GERD, hypertension, hypertrophy of prostate, and COPD who presents to the ED today for evaluation of abnormal lab. Patient reports he was seen by his PCP yesterday because he was experiencing nausea which was ultimately attributed to GERD. He was started on dexilant. His PCP sent labs and today he received a phone call telling him to come to the emergency department because his creatinine was elevated. Here his creatinine is 3.78 and BUN is 24. The patient denies any symptoms currently. He denies fevers, chills, vomiting, diarrhea, decreased urination, chest pain, shortness of breath.   The history is provided by the patient. No language interpreter was used.    Past Medical History  Diagnosis Date  . Complete heart block   . Chronic renal insufficiency     baseline creatinine is 1.8  . GERD (gastroesophageal reflux disease)   . Chronic systolic dysfunction of left ventricle 4/15    EF 40-45% in setting of PMT on limited echo  . Hypertrophy of prostate with urinary obstruction and other lower urinary tract symptoms (LUTS)   . Chronic kidney disease, stage III (moderate)   . Vitamin B12 deficiency   . Hypertension   . Mild pulmonary hypertension   . Metabolic syndrome   . COPD (chronic obstructive pulmonary disease)    Past Surgical History  Procedure Laterality Date  . Pacemaker insertion   03/14/13    SJM Endurity implanted by Dr Truman Hayward in [place] for complete heart block  . Other surgical history      Intestinal blockage x2  . Other surgical history  1964    Football injury   Family History  Problem Relation Age of Onset  . Cancer Father    History  Substance Use Topics  . Smoking status: Former Smoker -- 40 years    Types: Cigarettes    Quit date: 05/02/2011  . Smokeless tobacco: Not on file  . Alcohol Use: No    Review of Systems  Constitutional: Negative for fever and chills.  Respiratory: Negative for shortness of breath.   Cardiovascular: Negative for chest pain.  Gastrointestinal: Negative for nausea, vomiting and abdominal pain.  Genitourinary: Negative for dysuria and difficulty urinating.  All other systems reviewed and are negative.     Allergies  Review of patient's allergies indicates no known allergies.  Home Medications   Prior to Admission medications   Medication Sig Start Date End Date Taking? Authorizing Provider  amLODipine (NORVASC) 10 MG tablet Take 1 tablet (10 mg total) by mouth daily. 12/04/13  Yes Arnoldo Lenis, MD  aspirin EC 81 MG tablet Take 1 tablet (81 mg total) by mouth daily. 08/22/13  Yes Janece Canterbury, MD  dexlansoprazole (DEXILANT) 60 MG capsule Take 60 mg by mouth daily.   Yes Historical Provider, MD  lisinopril (PRINIVIL,ZESTRIL) 20 MG tablet Take 20 mg by mouth daily.   Yes Historical Provider, MD  tamsulosin (FLOMAX) 0.4 MG CAPS capsule Take 0.4 mg by mouth at bedtime.    Yes Historical Provider, MD   BP 146/88  Pulse 88  Temp(Src) 98.9 F (37.2 C) (Oral)  Resp 20  SpO2 96% Physical Exam  Nursing note and vitals reviewed. Constitutional: He is oriented to person, place, and time. He appears well-developed and well-nourished. No distress.  HENT:  Head: Normocephalic and atraumatic.  Right Ear: External ear normal.  Left Ear: External ear normal.  Nose: Nose normal.  Eyes: Conjunctivae are normal.   Neck: Normal range of motion. No tracheal deviation present.  Cardiovascular: Normal rate, regular rhythm and normal heart sounds.   Pulmonary/Chest: Effort normal and breath sounds normal. No stridor.  Abdominal: Soft. He exhibits no distension. There is no tenderness.  Musculoskeletal: Normal range of motion.  Neurological: He is alert and oriented to person, place, and time.  Skin: Skin is warm and dry. He is not diaphoretic.  Psychiatric: He has a normal mood and affect. His behavior is normal.    ED Course  Procedures (including critical care time) Labs Review Labs Reviewed  CBC - Abnormal; Notable for the following:    RBC 4.17 (*)    Hemoglobin 11.6 (*)    HCT 35.3 (*)    All other components within normal limits  COMPREHENSIVE METABOLIC PANEL - Abnormal; Notable for the following:    Glucose, Bld 109 (*)    BUN 24 (*)    Creatinine, Ser 3.78 (*)    Total Bilirubin <0.2 (*)    GFR calc non Af Amer 15 (*)    GFR calc Af Amer 18 (*)    All other components within normal limits  URINALYSIS, ROUTINE W REFLEX MICROSCOPIC  SODIUM, URINE, RANDOM  CREATININE, URINE, RANDOM  BASIC METABOLIC PANEL  CBC    Imaging Review No results found.   EKG Interpretation   Date/Time:  Thursday January 01 2014 17:12:45 EDT Ventricular Rate:  70 PR Interval:  151 QRS Duration: 148 QT Interval:  435 QTC Calculation: 469 R Axis:   -78 Text Interpretation:  Sinus rhythm RBBB and LAFB Left ventricular  hypertrophy no longer paced No significant change was found Reconfirmed by  Wyvonnia Dusky  MD, [place] [phone]) on 01/01/2014 5:21:34 PM      MDM   Final diagnoses:  Acute on chronic renal failure    Patient presents to ED with acute on chronic renal failure. No vomiting or diarrhea. Nausea yesterday, but resolved today. Dexilant is patient's only new medication. Patient with history of prostate hypertrophy, but states he is urinating normally. Patient is hemodynamically stable.  Discussed case with hospitalist who agrees with admission. Admission is appreciated. Patient / Family / Caregiver informed of clinical course, understand medical decision-making process, and agree with plan.   Elwyn Lade, PA-C 01/01/14 1734

## 2014-01-01 NOTE — H&P (Signed)
[facility] History and Physical  Neilan Rizzo GDJ:[id number] DOB: 1950/10/08 DOA: 01/01/2014  Referring physician: EDP PCP: Jacqualine Code, DO   Chief Complaint: sent to ED from PCP office for acute on CKD.   HPI: Troy Petty is a 63 y.o. male with prior h/o hypertension, cardiomyopathy is LVef of 45 to 50%, non obstructive CAD, complete heart block  S/p pacemaker placement, CKD stage 3, baseline creatinine between 1.8 to 2, BPH comes in for Acute on chronic renal failure. He also reports that he was recently started on coreg and since then he has been nauseated with loss of appetite and eventually stopped the medications. Other than othat he does not report any complaints. He is referred for admission for acute on CKD.  He will be admitted to med surg bed.    Review of Systems:  Constitutional:  No weight loss, night sweats, Fevers, chills, fatigue.  HEENT:  No headaches, Difficulty swallowing,Tooth/dental problems,Sore throat,  No sneezing, itching, ear ache, nasal congestion, post nasal drip,  Cardio-vascular:  No chest pain, Orthopnea, PND, swelling in lower extremities, anasarca, dizziness, palpitations  GI:  No heartburn, indigestion, abdominal pain, vomiting, diarrhea, change in bowel habits, . Persistent nausea and loss of appetite for the last one month after starting the coreg, which was later discontinued.  Resp:  No shortness of breath with exertion or at rest. No excess mucus, no productive cough, No non-productive cough, No coughing up of blood.No change in color of mucus.No wheezing.No chest wall deformity  Skin:  no rash or lesions.  GU:  no dysuria, change in color of urine, no urgency or frequency. No flank pain.  Musculoskeletal:  No joint pain or swelling. No decreased range of motion. No back pain.  Psych:  No change in mood or affect. No depression or anxiety. No memory loss.   Past Medical History  Diagnosis Date  . Complete heart block    . Chronic renal insufficiency     baseline creatinine is 1.8  . GERD (gastroesophageal reflux disease)   . Chronic systolic dysfunction of left ventricle 4/15    EF 40-45% in setting of PMT on limited echo  . Hypertrophy of prostate with urinary obstruction and other lower urinary tract symptoms (LUTS)   . Chronic kidney disease, stage III (moderate)   . Vitamin B12 deficiency   . Hypertension   . Mild pulmonary hypertension   . Metabolic syndrome   . COPD (chronic obstructive pulmonary disease)    Past Surgical History  Procedure Laterality Date  . Pacemaker insertion  03/14/13    SJM Endurity implanted by Dr Truman Hayward in [place] for complete heart block  . Other surgical history      Intestinal blockage x2  . Other surgical history  1964    Football injury   Social History:  reports that he quit smoking about 2 years ago. His smoking use included Cigarettes. He smoked 0.00 packs per day for 40 years. He does not have any smokeless tobacco history on file. He reports that he does not drink alcohol or use illicit drugs.  No Known Allergies  Family History  Problem Relation Age of Onset  . Cancer Father      Prior to Admission medications   Medication Sig Start Date End Date Taking? Authorizing Provider  amLODipine (NORVASC) 10 MG tablet Take 1 tablet (10 mg total) by mouth daily. 12/04/13  Yes Arnoldo Lenis, MD  aspirin EC 81 MG tablet Take 1 tablet (81 mg total)  by mouth daily. 08/22/13  Yes Janece Canterbury, MD  dexlansoprazole (DEXILANT) 60 MG capsule Take 60 mg by mouth daily.   Yes Historical Provider, MD  lisinopril (PRINIVIL,ZESTRIL) 20 MG tablet Take 20 mg by mouth daily.   Yes Historical Provider, MD  tamsulosin (FLOMAX) 0.4 MG CAPS capsule Take 0.4 mg by mouth at bedtime.    Yes Historical Provider, MD   Physical Exam: Filed Vitals:   01/01/14 1435 01/01/14 1445 01/01/14 1500 01/01/14 1515  BP: 130/64 127/75 131/77 122/77  Pulse: 76 74 80 74  Temp:        TempSrc:      Resp: 18 19 17 20   SpO2: 99% 98% 97% 97%    Wt Readings from Last 3 Encounters:  11/17/13 92.534 kg (204 lb)  10/01/13 93.895 kg (207 lb)  08/22/13 93.441 kg (206 lb)    General:  Appears calm and comfortable Eyes: PERRL, normal lids, irises & conjunctiva Neck: no LAD, masses or thyromegaly Cardiovascular: RRR, no m/r/g. No LE edema. Telemetry: SR, no arrhythmias  Respiratory: CTA bilaterally, no w/r/r. Normal respiratory effort. Abdomen: soft, ntnd Skin: no rash or induration seen on limited exam Musculoskeletal: grossly normal tone BUE/BLE Neurologic: grossly non-focal.          Labs on Admission:  Basic Metabolic Panel:  Recent Labs Lab 01/01/14 1316  NA 142  K 4.8  CL 110  CO2 19  GLUCOSE 109*  BUN 24*  CREATININE 3.78*  CALCIUM 8.6   Liver Function Tests:  Recent Labs Lab 01/01/14 1316  AST 13  ALT 16  ALKPHOS 59  BILITOT <0.2*  PROT 7.6  ALBUMIN 3.5   No results found for this basename: LIPASE, AMYLASE,  in the last 168 hours No results found for this basename: AMMONIA,  in the last 168 hours CBC:  Recent Labs Lab 01/01/14 1316  WBC 8.3  HGB 11.6*  HCT 35.3*  MCV 84.7  PLT 273   Cardiac Enzymes: No results found for this basename: CKTOTAL, CKMB, CKMBINDEX, TROPONINI,  in the last 168 hours  BNP (last 3 results)  Recent Labs  08/21/13 1615  PROBNP 3632.0*   CBG: No results found for this basename: GLUCAP,  in the last 168 hours  Radiological Exams on Admission: No results found.  EKG: pending.   Assessment/Plan Active Problems:   Acute on chronic renal failure   Acute renal failure  Acute on CKDstage 3.: - unclear etiology, probably from dehydration from decreased po intake from nausea and loss of appetite for the last 4 weeks. Gentle hydration and repeat renal parameters in am. Stop ACE inhibitor. If the renal parameters doesn't improve, please call renal consult in am. UA is negative. Urine electrolytes  ordered and US renal pending.   Complete heart block s/p pacemaker: Stable.   Anemia: Normocytic. Continue to monitor.   Hypertension: Controlled.  Resume amlodipine and prn hydralazine.    Non obstructive CAD: Pt denies any chest pain or sob. No pedal edema.  Resume aspirin.       Code Status:full code.  DVT Prophylaxis: Family Communication: family members at bedside.  Disposition Plan: admit to medsurg.   Time spent:55 minutes  [facility] Pager [phone]  **Disclaimer: This note may have been dictated with voice recognition software. Similar sounding words can inadvertently be transcribed and this note may contain transcription errors which may not have been corrected upon publication of note.**

## 2014-01-01 NOTE — Progress Notes (Signed)
Report received from [facility] in ED for patient to be admitted into 5w06

## 2014-01-01 NOTE — ED Notes (Addendum)
The patient said his MD's office called and told him to come to the ED because his creatinine was elevated at 3.59.  The patient denies any symptoms including pain.  He advises he is still able to urinate and it is clear and yellow.  The patient is here to be evaluated.  The patient was sent here by Dr. Elvina Mattes in [place], [place].  His number [phone].

## 2014-01-01 NOTE — Telephone Encounter (Signed)
Patient called very upset that he has not been informed of his lab results that were drawn in July.  He recently had labs drawn again with his family doctor this week and has been told to immediately go to the ER due to his kidneys shutting down.

## 2014-01-02 ENCOUNTER — Inpatient Hospital Stay (HOSPITAL_COMMUNITY): Payer: Medicare Other

## 2014-01-02 DIAGNOSIS — R339 Retention of urine, unspecified: Secondary | ICD-10-CM

## 2014-01-02 LAB — CBC
HCT: 32.1 % — ABNORMAL LOW (ref 39.0–52.0)
HEMOGLOBIN: 10.3 g/dL — AB (ref 13.0–17.0)
MCH: 27 pg (ref 26.0–34.0)
MCHC: 32.1 g/dL (ref 30.0–36.0)
MCV: 84.3 fL (ref 78.0–100.0)
Platelets: 241 10*3/uL (ref 150–400)
RBC: 3.81 MIL/uL — AB (ref 4.22–5.81)
RDW: 13.7 % (ref 11.5–15.5)
WBC: 8.4 10*3/uL (ref 4.0–10.5)

## 2014-01-02 LAB — BASIC METABOLIC PANEL
Anion gap: 14 (ref 5–15)
BUN: 25 mg/dL — AB (ref 6–23)
CO2: 17 mEq/L — ABNORMAL LOW (ref 19–32)
Calcium: 7.9 mg/dL — ABNORMAL LOW (ref 8.4–10.5)
Chloride: 112 mEq/L (ref 96–112)
Creatinine, Ser: 3.91 mg/dL — ABNORMAL HIGH (ref 0.50–1.35)
GFR calc non Af Amer: 15 mL/min — ABNORMAL LOW (ref >90)
GFR, EST AFRICAN AMERICAN: 17 mL/min — AB (ref >90)
Glucose, Bld: 90 mg/dL (ref 70–99)
POTASSIUM: 5 meq/L (ref 3.7–5.3)
SODIUM: 143 meq/L (ref 137–147)

## 2014-01-02 MED ORDER — MORPHINE SULFATE 2 MG/ML IJ SOLN
1.0000 mg | INTRAMUSCULAR | Status: DC | PRN
  Administered 2014-01-02: 1 mg via INTRAVENOUS
  Filled 2014-01-02: qty 1

## 2014-01-02 MED ORDER — BELLADONNA ALKALOIDS-OPIUM 16.2-60 MG RE SUPP
1.0000 | Freq: Four times a day (QID) | RECTAL | Status: DC | PRN
  Administered 2014-01-02 – 2014-01-04 (×6): 1 via RECTAL
  Filled 2014-01-02 (×6): qty 1

## 2014-01-02 MED ORDER — FINASTERIDE 5 MG PO TABS
5.0000 mg | ORAL_TABLET | Freq: Every day | ORAL | Status: DC
  Administered 2014-01-02 – 2014-01-04 (×3): 5 mg via ORAL
  Filled 2014-01-02 (×3): qty 1

## 2014-01-02 NOTE — ED Provider Notes (Signed)
Medical screening examination/treatment/procedure(s) were conducted as a shared visit with non-physician practitioner(s) and myself.  I personally evaluated the patient during the encounter.  Patient from PCPs office with worsening renal failure. Seen recently for nausea and started on dexilant. No chest pain or shortness of breath. RRR, CTAB   EKG Interpretation   Date/Time:  Thursday January 01 2014 17:12:45 EDT Ventricular Rate:  70 PR Interval:  151 QRS Duration: 148 QT Interval:  435 QTC Calculation: 469 R Axis:   -78 Text Interpretation:  Sinus rhythm RBBB and LAFB Left ventricular  hypertrophy no longer paced No significant change was found Reconfirmed by  Wyvonnia Dusky  MD, Azelyn Batie [phone]) on 01/01/2014 5:21:34 PM       Ezequiel Essex, MD 01/02/14 [phone]

## 2014-01-02 NOTE — Progress Notes (Signed)
[facility] PROGRESS NOTE  Troy Petty JAS:[id number] DOB: [birth year] DOA: 01/01/2014 PCP: Jacqualine Code, DO  Assessment/Plan: 1. Acute on chronic renal failure secondary to bladder outlet obstruction 1. Over 800cc noted on bladder scan today in the setting of enlarged prostate 2. Will insert foley cath 3. Will likely need to be discharged home w/ indwelling cath with close follow up with Urology as outpatient 2. Anemia 1. Pt remains hemodynamically stable 2. Cont to monitor 3. Normocytic 3. HTN 1. BP stable 2. On amlodipine 4. Enlarged Prostate 1. On flomax 2. Add proscar 3. Indwelling foley cath for now per above 5. CAD 1. Stable 2. No chest pain 6. DVT prophylaxis 1. Lovenox subq  Code Status: Full Family Communication: Pt in room Disposition Plan: home pending improvement in renal function   Consultants:    Procedures:    Antibiotics:    HPI/Subjective: Reports frequent urination overnight and this AM. No acute events noted.  Objective: Filed Vitals:   01/01/14 1700 01/01/14 1741 01/01/14 2227 01/02/14 0550  BP: 146/88 151/90 145/75 143/75  Pulse: 88 75 71 76  Temp:  98.9 F (37.2 C) 98.5 F (36.9 C) 98.2 F (36.8 C)  TempSrc:  Oral Oral Oral  Resp: 20 18 16 17   Height:  5\' 11"  (1.803 m)    Weight:  91.173 kg (201 lb)    SpO2: 96% 99% 97% 96%    Intake/Output Summary (Last 24 hours) at 01/02/14 1027 Last data filed at 01/02/14 0920  Gross per 24 hour  Intake    220 ml  Output    850 ml  Net   -630 ml   Filed Weights   01/01/14 1741  Weight: 91.173 kg (201 lb)    Exam:   General:  Awake, in nad  Cardiovascular: regular, s1, s2  Respiratory: normal resp effort, no wheezing  Abdomen: soft,nondistended  Musculoskeletal: perfused, no clubbing   Data Reviewed: Basic Metabolic Panel:  Recent Labs Lab 01/01/14 1316 01/02/14 0600  NA 142 143  K 4.8 5.0  CL 110 112  CO2 19 17*  GLUCOSE 109* 90  BUN 24* 25*   CREATININE 3.78* 3.91*  CALCIUM 8.6 7.9*   Liver Function Tests:  Recent Labs Lab 01/01/14 1316  AST 13  ALT 16  ALKPHOS 59  BILITOT <0.2*  PROT 7.6  ALBUMIN 3.5   No results found for this basename: LIPASE, AMYLASE,  in the last 168 hours No results found for this basename: AMMONIA,  in the last 168 hours CBC:  Recent Labs Lab 01/01/14 1316 01/02/14 0600  WBC 8.3 8.4  HGB 11.6* 10.3*  HCT 35.3* 32.1*  MCV 84.7 84.3  PLT 273 241   Cardiac Enzymes: No results found for this basename: CKTOTAL, CKMB, CKMBINDEX, TROPONINI,  in the last 168 hours BNP (last 3 results)  Recent Labs  08/21/13 1615  PROBNP 3632.0*   CBG: No results found for this basename: GLUCAP,  in the last 168 hours  No results found for this or any previous visit (from the past 240 hour(s)).   Studies: US Renal  01/01/2014   CLINICAL DATA:  Evaluate for hydronephrosis.  EXAM: RENAL/URINARY TRACT ULTRASOUND COMPLETE  COMPARISON:  None.  FINDINGS: Right Kidney:  Length: 13.4 cm. Severe hydronephrosis and hydroureter of the visualized portion of the proximal segment. Echogenicity upper normal to mildly increased.  Left Kidney:  Length: 14.3 cm.  Moderate hydronephrosis.  Bladder:  And mild bladder wall thickening is nonspecific. Prostate gland is enlarged  and lobular in contour measuring up to 5.8 cm.  IMPRESSION: Severe right and moderate left hydronephrosis.  Nonspecific mild bladder wall thickening.  Prostatomegaly.   Electronically Signed   By: Carlos Levering M.D.   On: 01/01/2014 21:58    Scheduled Meds: . amLODipine  10 mg Oral Daily  . aspirin EC  81 mg Oral Daily  . enoxaparin (LOVENOX) injection  30 mg Subcutaneous Q24H  . finasteride  5 mg Oral Daily  . pantoprazole  40 mg Oral Daily  . tamsulosin  0.4 mg Oral QHS   Continuous Infusions: . sodium chloride 100 mL/hr at 01/02/14 0012    Active Problems:   GERD (gastroesophageal reflux disease)   Acute kidney injury   Acute on chronic  renal failure   Acute renal failure   Hypertension   Anemia  Time spent: 36min  Ruie Sendejo, [facility] Pager [phone]. If 7PM-7AM, please contact night-coverage at [email or web address], password [facility] 01/02/2014, 10:27 AM  LOS: 1 day

## 2014-01-03 DIAGNOSIS — I10 Essential (primary) hypertension: Secondary | ICD-10-CM

## 2014-01-03 LAB — BASIC METABOLIC PANEL
ANION GAP: 13 (ref 5–15)
BUN: 22 mg/dL (ref 6–23)
CHLORIDE: 111 meq/L (ref 96–112)
CO2: 18 mEq/L — ABNORMAL LOW (ref 19–32)
Calcium: 8.1 mg/dL — ABNORMAL LOW (ref 8.4–10.5)
Creatinine, Ser: 3.75 mg/dL — ABNORMAL HIGH (ref 0.50–1.35)
GFR calc Af Amer: 18 mL/min — ABNORMAL LOW (ref >90)
GFR calc non Af Amer: 16 mL/min — ABNORMAL LOW (ref >90)
GLUCOSE: 99 mg/dL (ref 70–99)
POTASSIUM: 4.5 meq/L (ref 3.7–5.3)
Sodium: 142 mEq/L (ref 137–147)

## 2014-01-03 NOTE — Consult Note (Signed)
Consult: Urinary retention, hydronephrosis, acute on chronic renal failure Requested by: Dr. Wyline Copas  History of Present Illness: 63 year old admitted for acute on chronic renal failure. Creatinine found to be 3.59 with baseline creatinine 1.8-2. He underwent abdominal ultrasound which showed bilateral hydronephrosis, a distended bladder and a large median lobe. Foley catheter was placed. He underwent a CT scan of the abdomen and pelvis which showed bilateral hydroureteronephrosis down to a thickwalled bladder, Foley in good position, prostate about 65 g. There was no lymphadenopathy or worrisome lytic or blastic bone lesions. I reviewed all the images. Since then he has made liters of urine with a slow improvement of his creatinine. He did have some mild hematuria after the Foley was placed.  He reports to me that he has a long history of BPH and has been treated with tamsulosin. He hasn't seen a neurologist recently underwent cystoscopy many years ago for a football injury. He says he typically voids with a weak stream and he also has some frequency and urgency. He denies recent gross hematuria prior to the Foley catheter placement.  Comorbidities include hypertension, cardiomyopathy/LVef of 45 to 50%, non obstructive CAD, complete heart block S/p pacemaker placement, CKD stage 3, baseline creatinine between 1.8 to 2.     Past Medical History  Diagnosis Date  . Complete heart block   . Chronic renal insufficiency     baseline creatinine is 1.8  . GERD (gastroesophageal reflux disease)   . Chronic systolic dysfunction of left ventricle 4/15    EF 40-45% in setting of PMT on limited echo  . Hypertrophy of prostate with urinary obstruction and other lower urinary tract symptoms (LUTS)   . Chronic kidney disease, stage III (moderate)   . Vitamin B12 deficiency   . Hypertension   . Mild pulmonary hypertension   . Metabolic syndrome   . COPD (chronic obstructive pulmonary disease)    Past  Surgical History  Procedure Laterality Date  . Pacemaker insertion  03/14/13    SJM Endurity implanted by Dr Truman Hayward in [place] for complete heart block  . Other surgical history      Intestinal blockage x2  . Other surgical history  1964    Football injury    Home Medications:  Prescriptions prior to admission  Medication Sig Dispense Refill  . amLODipine (NORVASC) 10 MG tablet Take 1 tablet (10 mg total) by mouth daily.  30 tablet  6  . aspirin EC 81 MG tablet Take 1 tablet (81 mg total) by mouth daily.  30 tablet  0  . dexlansoprazole (DEXILANT) 60 MG capsule Take 60 mg by mouth daily.      Marland Kitchen lisinopril (PRINIVIL,ZESTRIL) 20 MG tablet Take 20 mg by mouth daily.      . tamsulosin (FLOMAX) 0.4 MG CAPS capsule Take 0.4 mg by mouth at bedtime.        Allergies: No Known Allergies  Family History  Problem Relation Age of Onset  . Cancer Father    Social History:  reports that he quit smoking about 2 years ago. His smoking use included Cigarettes. He smoked 0.00 packs per day for 40 years. He does not have any smokeless tobacco history on file. He reports that he does not drink alcohol or use illicit drugs.  ROS: A complete review of systems was performed.  All systems are negative except for pertinent findings as noted. ROS   Physical Exam:  Vital signs in last 24 hours: Temp:  [98.5 F (36.9 C)-99.2 F (  37.3 C)] 98.5 F (36.9 C) (09/05 1442) Pulse Rate:  [77-92] 86 (09/05 1442) Resp:  [16-18] 16 (09/05 1442) BP: (137-140)/(76-79) 137/76 mmHg (09/05 1442) SpO2:  [96 %-98 %] 96 % (09/05 1442) General:  Alert and oriented, No acute distress HEENT: Normocephalic, atraumatic Neck: No JVD or lymphadenopathy Cardiovascular: Regular rate and rhythm Lungs: Regular rate and effort Abdomen: Soft, nontender, nondistended, no abdominal masses Back: No CVA tenderness Extremities: No edema Neurologic: Grossly intact  GU: Foley catheter in place, 16 French, mild hematuria with red  to clear urine; 1800 mL in the bag Digital rectal exam-prostate about 40 g in size, benign, there are no hard areas or nodules. All landmarks preserved.  Laboratory Data:  Results for orders placed during the hospital encounter of 01/01/14 (from the past 24 hour(s))  BASIC METABOLIC PANEL     Status: Abnormal   Collection Time    01/03/14  8:24 AM      Result Value Ref Range   Sodium 142  137 - 147 mEq/L   Potassium 4.5  3.7 - 5.3 mEq/L   Chloride 111  96 - 112 mEq/L   CO2 18 (*) 19 - 32 mEq/L   Glucose, Bld 99  70 - 99 mg/dL   BUN 22  6 - 23 mg/dL   Creatinine, Ser 3.75 (*) 0.50 - 1.35 mg/dL   Calcium 8.1 (*) 8.4 - 10.5 mg/dL   GFR calc non Af Amer 16 (*) >90 mL/min   GFR calc Af Amer 18 (*) >90 mL/min   Anion gap 13  5 - 15   No results found for this or any previous visit (from the past 240 hour(s)). Creatinine:  Recent Labs  01/01/14 1316 01/02/14 0600 01/03/14 0824  CREATININE 3.78* 3.91* 3.75*    Impression/Assessment/Plan:  -Urinary retention, incomplete bladder emptying-Foley catheter placed -- should be discharged to home with foley.  -Bilateral hydronephrosis, acute renal failure-patient has made copious amounts of urine since the Foley placed and therefore I doubt obstruction. Hydronephrosis likely from long-standing dilation. -BPH-normal digital rectal exam, on tamsulosin. We discussed the nature risk and benefits of 5 alpha reductase inhibitors. I discussed FDA warnings with the patient and his wife on sexual dysfunction a higher grade prostate cancer. All questions answered. He elected to start finasteride. He'll be on maximal medical therapy and follow value in him in the office with cystoscopy for the urinary retention and gross hematuria. We discussed we will likely need to consider an outlet procedure such as TURP or greenlight. -Gross hematuria-this is likely from Foley catheter placement it might be from hemorrhagic cystitis from a distended bladder. Again  we'll evaluate with cystoscopy.  I gave the patient's wife my card/contract information.   Festus Aloe 01/03/2014, 3:16 PM

## 2014-01-03 NOTE — Discharge Instructions (Signed)
Foley Catheter Care °A Foley catheter is a soft, flexible tube that is placed into the bladder to drain urine. A Foley catheter may be inserted if: °· You leak urine or are not able to control when you urinate (urinary incontinence). °· You are not able to urinate when you need to (urinary retention). °· You had prostate surgery or surgery on the genitals. °· You have certain medical conditions, such as multiple sclerosis, dementia, or a spinal cord injury. °If you are going home with a Foley catheter in place, follow the instructions below. °TAKING CARE OF THE CATHETER °1. Wash your hands with soap and water. °2. Using mild soap and warm water on a clean washcloth: °· Clean the area on your body closest to the catheter insertion site using a circular motion, moving away from the catheter. Never wipe toward the catheter because this could sweep bacteria up into the urethra and cause infection. °· Remove all traces of soap. Pat the area dry with a clean towel. For males, reposition the foreskin. °3. Attach the catheter to your leg so there is no tension on the catheter. Use adhesive tape or a leg strap. If you are using adhesive tape, remove any sticky residue left behind by the previous tape you used. °4. Keep the drainage bag below the level of the bladder, but keep it off the floor. °5. Check throughout the day to be sure the catheter is working and urine is draining freely. Make sure the tubing does not become kinked. °6. Do not pull on the catheter or try to remove it. Pulling could damage internal tissues. °TAKING CARE OF THE DRAINAGE BAGS °You will be given two drainage bags to take home. One is a large overnight drainage bag, and the other is a smaller leg bag that fits underneath clothing. You may wear the overnight bag at any time, but you should never wear the smaller leg bag at night. Follow the instructions below for how to empty, change, and clean your drainage bags. °Emptying the Drainage Bag °You must  empty your drainage bag when it is  -½ full or at least 2-3 times a day. °1. Wash your hands with soap and water. °2. Keep the drainage bag below your hips, below the level of your bladder. This stops urine from going back into the tubing and into your bladder. °3. Hold the dirty bag over the toilet or a clean container. °4. Open the pour spout at the bottom of the bag and empty the urine into the toilet or container. Do not let the pour spout touch the toilet, container, or any other surface. Doing so can place bacteria on the bag, which can cause an infection. °5. Clean the pour spout with a gauze pad or cotton ball that has rubbing alcohol on it. °6. Close the pour spout. °7. Attach the bag to your leg with adhesive tape or a leg strap. °8. Wash your hands well. °Changing the Drainage Bag °Change your drainage bag once a month or sooner if it starts to smell bad or look dirty. Below are steps to follow when changing the drainage bag. °1. Wash your hands with soap and water. °2. Pinch off the rubber catheter so that urine does not spill out. °3. Disconnect the catheter tube from the drainage tube at the connection valve. Do not let the tubes touch any surface. °4. Clean the end of the catheter tube with an alcohol wipe. Use a different alcohol wipe to clean the   end of the drainage tube. °5. Connect the catheter tube to the drainage tube of the clean drainage bag. °6. Attach the new bag to the leg with adhesive tape or a leg strap. Avoid attaching the new bag too tightly. °7. Wash your hands well. °Cleaning the Drainage Bag °1. Wash your hands with soap and water. °2. Wash the bag in warm, soapy water. °3. Rinse the bag thoroughly with warm water. °4. Fill the bag with a solution of white vinegar and water (1 cup vinegar to 1 qt warm water [.2 L vinegar to 1 L warm water]). Close the bag and soak it for 30 minutes in the solution. °5. Rinse the bag with warm water. °6. Hang the bag to dry with the pour spout open  and hanging downward. °7. Store the clean bag (once it is dry) in a clean plastic bag. °8. Wash your hands well. °PREVENTING INFECTION °· Wash your hands before and after handling your catheter. °· Take showers daily and wash the area where the catheter enters your body. Do not take baths. Replace wet leg straps with dry ones, if this applies. °· Do not use powders, sprays, or lotions on the genital area. Only use creams, lotions, or ointments as directed by your caregiver. °· For females, wipe from front to back after each bowel movement. °· Drink enough fluids to keep your urine clear or pale yellow unless you have a fluid restriction. °· Do not let the drainage bag or tubing touch or lie on the floor. °· Wear cotton underwear to absorb moisture and to keep your skin drier. °SEEK MEDICAL CARE IF:  °· Your urine is cloudy or smells unusually bad. °· Your catheter becomes clogged. °· You are not draining urine into the bag or your bladder feels full. °· Your catheter starts to leak. °SEEK IMMEDIATE MEDICAL CARE IF:  °· You have pain, swelling, redness, or pus where the catheter enters the body. °· You have pain in the abdomen, legs, lower back, or bladder. °· You have a fever. °· You see blood fill the catheter, or your urine is pink or red. °· You have nausea, vomiting, or chills. °· Your catheter gets pulled out. °MAKE SURE YOU:  °· Understand these instructions. °· Will watch your condition. °· Will get help right away if you are not doing well or get worse. °Document Released: 04/17/2005 Document Revised: 09/01/2013 Document Reviewed: 04/08/2012 °[employer]® Patient Information ©2015 [employer]. This information is not intended to replace advice given to you by your health care provider. Make sure you discuss any questions you have with your health care provider. ° °

## 2014-01-03 NOTE — Progress Notes (Signed)
[facility] PROGRESS NOTE  Troy Petty KDT:[id number] DOB: [birth year] DOA: 01/01/2014 PCP: Jacqualine Code, DO  Assessment/Plan: 1. Acute on chronic renal failure secondary to bladder outlet obstruction 1. Over 800cc noted on bladder scan today in the setting of enlarged prostate 2. Pt now with indwelling foley cath with good output 3. Discussed case with on-call Urologist who agrees with indwelling cath and for follow up upon discharge 2. Hematuria 1. Transient and now resolving 2. Per Urologist over phone, likely related to marked bladder distension 3. Observe for now 4. Hgb remains stable 3. Anemia 1. Pt remains hemodynamically stable 2. Cont to monitor 3. Normocytic 4. HTN 1. BP stable 2. On amlodipine 5. Enlarged Prostate 1. On flomax 2. Added proscar 3. Indwelling foley cath for now per above 6. CAD 1. Stable 2. No chest pain 7. DVT prophylaxis 1. Lovenox subq d/c'd secondary to transient hematuria 2. On scd's   Code Status: Full Family Communication: Pt in room Disposition Plan: home pending improvement in renal function  Consultants:    Procedures:    Antibiotics:    HPI/Subjective: Reports bladder spasms improving. Hematuria resolving  Objective: Filed Vitals:   01/02/14 1051 01/02/14 1447 01/02/14 2109 01/03/14 0638  BP: 150/86 143/80 140/79 138/79  Pulse:  90 92 77  Temp:  98.4 F (36.9 C) 99.2 F (37.3 C) 98.8 F (37.1 C)  TempSrc:  Oral Oral Oral  Resp:  16 16 18   Height:      Weight:      SpO2:  98% 98% 96%    Intake/Output Summary (Last 24 hours) at 01/03/14 1231 Last data filed at 01/03/14 9983  Gross per 24 hour  Intake      0 ml  Output   3100 ml  Net  -3100 ml   Filed Weights   01/01/14 1741  Weight: 91.173 kg (201 lb)    Exam:   General:  Awake, in nad  Cardiovascular: regular, s1, s2  Respiratory: normal resp effort, no wheezing  Abdomen: soft,nondistended  Musculoskeletal: perfused, no  clubbing   Data Reviewed: Basic Metabolic Panel:  Recent Labs Lab 01/01/14 1316 01/02/14 0600 01/03/14 0824  NA 142 143 142  K 4.8 5.0 4.5  CL 110 112 111  CO2 19 17* 18*  GLUCOSE 109* 90 99  BUN 24* 25* 22  CREATININE 3.78* 3.91* 3.75*  CALCIUM 8.6 7.9* 8.1*   Liver Function Tests:  Recent Labs Lab 01/01/14 1316  AST 13  ALT 16  ALKPHOS 59  BILITOT <0.2*  PROT 7.6  ALBUMIN 3.5   No results found for this basename: LIPASE, AMYLASE,  in the last 168 hours No results found for this basename: AMMONIA,  in the last 168 hours CBC:  Recent Labs Lab 01/01/14 1316 01/02/14 0600  WBC 8.3 8.4  HGB 11.6* 10.3*  HCT 35.3* 32.1*  MCV 84.7 84.3  PLT 273 241   Cardiac Enzymes: No results found for this basename: CKTOTAL, CKMB, CKMBINDEX, TROPONINI,  in the last 168 hours BNP (last 3 results)  Recent Labs  08/21/13 1615  PROBNP 3632.0*   CBG: No results found for this basename: GLUCAP,  in the last 168 hours  No results found for this or any previous visit (from the past 240 hour(s)).   Studies: Ct Abdomen Pelvis Wo Contrast  01/03/2014   CLINICAL DATA:  Acute renal failure. Difficulty urinating. Blood in Foley catheter bag after Foley placement.  EXAM: CT ABDOMEN AND PELVIS WITHOUT CONTRAST  TECHNIQUE: Multidetector  CT imaging of the abdomen and pelvis was performed following the standard protocol without IV contrast.  COMPARISON:  Ultrasound kidneys 01/01/2014  FINDINGS: Atelectasis in the lung bases.  Prominent hydronephrosis and hydroureter bilaterally. No renal or ureteral stones are demonstrated. Foley catheter in the bladder. The bladder is mostly decompressed but there is evidence of prominent bladder wall thickening with heterogeneous appearance of the urine present in the bladder. This suggests probability of infection with hemorrhage.  Unenhanced appearance of the liver, spleen, gallbladder, pancreas, adrenal glands, abdominal aorta, inferior vena cava, and  retroperitoneal lymph nodes is unremarkable. Stomach is filled with ingested material. No definitive wall thickening. Small bowel are decompressed. Stool in gas-filled colon without distention. No free air or free fluid in the abdomen. Postoperative changes in the anterior abdominal wall on the right suggesting hernia repair.  Pelvis: Prostate gland is enlarged at 4.6 x 5.4 cm. No free or loculated pelvic fluid collections. Appendix is not identified. No inflammatory changes in the pelvis. The degenerative change in the lumbar spine. No destructive bone lesions.  IMPRESSION: Prominent diffuse bladder wall thickening with probable hemorrhage or debris in the bladder lumen. Bilateral hydronephrosis and hydroureter. Changes likely to represent infection.   Electronically Signed   By: Lucienne Capers M.D.   On: 01/03/2014 01:26   US Renal  01/01/2014   CLINICAL DATA:  Evaluate for hydronephrosis.  EXAM: RENAL/URINARY TRACT ULTRASOUND COMPLETE  COMPARISON:  None.  FINDINGS: Right Kidney:  Length: 13.4 cm. Severe hydronephrosis and hydroureter of the visualized portion of the proximal segment. Echogenicity upper normal to mildly increased.  Left Kidney:  Length: 14.3 cm.  Moderate hydronephrosis.  Bladder:  And mild bladder wall thickening is nonspecific. Prostate gland is enlarged and lobular in contour measuring up to 5.8 cm.  IMPRESSION: Severe right and moderate left hydronephrosis.  Nonspecific mild bladder wall thickening.  Prostatomegaly.   Electronically Signed   By: Carlos Levering M.D.   On: 01/01/2014 21:58    Scheduled Meds: . amLODipine  10 mg Oral Daily  . aspirin EC  81 mg Oral Daily  . finasteride  5 mg Oral Daily  . pantoprazole  40 mg Oral Daily  . tamsulosin  0.4 mg Oral QHS   Continuous Infusions: . sodium chloride 100 mL/hr at 01/03/14 0998    Active Problems:   GERD (gastroesophageal reflux disease)   Acute kidney injury   Acute on chronic renal failure   Acute renal failure    Hypertension   Anemia  Time spent: 15min  [name], [facility] Pager [phone]. If 7PM-7AM, please contact night-coverage at [email or web address], password [facility] 01/03/2014, 12:31 PM  LOS: 2 days

## 2014-01-04 LAB — CBC
HCT: 37.3 % — ABNORMAL LOW (ref 39.0–52.0)
Hemoglobin: 11.8 g/dL — ABNORMAL LOW (ref 13.0–17.0)
MCH: 26.9 pg (ref 26.0–34.0)
MCHC: 31.6 g/dL (ref 30.0–36.0)
MCV: 85 fL (ref 78.0–100.0)
Platelets: 241 K/uL (ref 150–400)
RBC: 4.39 MIL/uL (ref 4.22–5.81)
RDW: 13.8 % (ref 11.5–15.5)
WBC: 10.2 K/uL (ref 4.0–10.5)

## 2014-01-04 LAB — BASIC METABOLIC PANEL
ANION GAP: 15 (ref 5–15)
BUN: 25 mg/dL — ABNORMAL HIGH (ref 6–23)
CO2: 17 mEq/L — ABNORMAL LOW (ref 19–32)
Calcium: 8.2 mg/dL — ABNORMAL LOW (ref 8.4–10.5)
Chloride: 109 mEq/L (ref 96–112)
Creatinine, Ser: 3.43 mg/dL — ABNORMAL HIGH (ref 0.50–1.35)
GFR calc Af Amer: 20 mL/min — ABNORMAL LOW (ref >90)
GFR calc non Af Amer: 17 mL/min — ABNORMAL LOW (ref >90)
GLUCOSE: 103 mg/dL — AB (ref 70–99)
POTASSIUM: 4.4 meq/L (ref 3.7–5.3)
SODIUM: 141 meq/L (ref 137–147)

## 2014-01-04 MED ORDER — FINASTERIDE 5 MG PO TABS: 5.0000 mg | ORAL_TABLET | Freq: Every day | ORAL | Status: DC

## 2014-01-04 MED ORDER — BELLADONNA ALKALOIDS-OPIUM 16.2-60 MG RE SUPP: 1.0000 | Freq: Four times a day (QID) | RECTAL | Status: DC | PRN

## 2014-01-04 NOTE — Discharge Summary (Signed)
Physician Discharge Summary  Troy Petty MWU:[id number] DOB: Aug 25, 1950 DOA: 01/01/2014  PCP: Jacqualine Code, DO  Admit date: 01/01/2014 Discharge date: 01/04/2014  Time spent: 35 minutes  Recommendations for Outpatient Follow-up:  1. Follow up with PCP in 1-2 weeks 2. Follow up with Dr. Junious Silk in 2 weeks  Discharge Diagnoses:  Active Problems:   GERD (gastroesophageal reflux disease)   Acute kidney injury   Acute on chronic renal failure   Acute renal failure   Hypertension   Anemia   Discharge Condition: Improved  Diet recommendation: Heart Healthy  Filed Weights   01/01/14 1741  Weight: 91.173 kg (201 lb)    History of present illness:  See admit h and p from 9/3 for details. Briefly, pt presents with acute on chronic renal failure. He was admitted for further work up.  Hospital Course:  1. Acute on chronic renal failure secondary to bladder outlet obstruction  1. Over 800cc noted on bladder scan in the setting of hx of enlarged prostate 2. Improvement with indwelling foley cath with good output 3. Consulted Urologist who agrees with continued indwelling cath and for follow up upon discharge 2. Hematuria  1. Transient and now resolving 2. Per Urologist, likely related to marked bladder distension prior to admit 3. Would cont to observe 4. Hgb remained stable 3. Anemia  1. Pt remained hemodynamically stable 2. Cont to monitor 3. Normocytic 4. HTN  1. BP stable 2. On amlodipine 5. Enlarged Prostate  1. On flomax 2. Added proscar 3. Indwelling foley cath for now per above 6. CAD  1. Stable 2. No chest pain 7. DVT prophylaxis  1. Lovenox subq d/c'd secondary to transient hematuria 2. On scd's while inpatient  Procedures:  Indwelling foley cath placement  Consultations:  Urology  Discharge Exam: Filed Vitals:   01/03/14 7253 01/03/14 1442 01/03/14 2125 01/04/14 0519  BP: 138/79 137/76 145/82 141/76  Pulse: 77 86 88 84  Temp: 98.8 F  (37.1 C) 98.5 F (36.9 C) 99 F (37.2 C) 99.1 F (37.3 C)  TempSrc: Oral Oral Oral Oral  Resp: 18 16 16 16   Height:      Weight:      SpO2: 96% 96% 94% 93%    General: Awake, in nad Cardiovascular: regular, s1, s2 Respiratory: normal resp effort, no wheezing  Discharge Instructions     Medication List    STOP taking these medications       lisinopril 20 MG tablet  Commonly known as:  PRINIVIL,ZESTRIL      TAKE these medications       amLODipine 10 MG tablet  Commonly known as:  NORVASC  Take 1 tablet (10 mg total) by mouth daily.     aspirin EC 81 MG tablet  Take 1 tablet (81 mg total) by mouth daily.     dexlansoprazole 60 MG capsule  Commonly known as:  DEXILANT  Take 60 mg by mouth daily.     finasteride 5 MG tablet  Commonly known as:  PROSCAR  Take 1 tablet (5 mg total) by mouth daily.     opium-belladonna 16.2-60 MG suppository  Commonly known as:  B&O SUPPRETTES  Place 1 suppository rectally every 6 (six) hours as needed for bladder spasms.     tamsulosin 0.4 MG Caps capsule  Commonly known as:  FLOMAX  Take 0.4 mg by mouth at bedtime.       No Known Allergies Follow-up Information   Follow up with [place], DO. Schedule an  appointment as soon as possible for a visit in 1 week.   Specialty:  Family Medicine   Contact information:   [address] [place] [address] [phone]       Follow up with Festus Aloe, MD In 2 weeks.   Specialty:  Urology   Contact information:   [address] [place] [address] [phone]        The results of significant diagnostics from this hospitalization (including imaging, microbiology, ancillary and laboratory) are listed below for reference.    Significant Diagnostic Studies: Ct Abdomen Pelvis Wo Contrast  01/03/2014   CLINICAL DATA:  Acute renal failure. Difficulty urinating. Blood in Foley catheter bag after Foley placement.  EXAM: CT ABDOMEN AND PELVIS WITHOUT  CONTRAST  TECHNIQUE: Multidetector CT imaging of the abdomen and pelvis was performed following the standard protocol without IV contrast.  COMPARISON:  Ultrasound kidneys 01/01/2014  FINDINGS: Atelectasis in the lung bases.  Prominent hydronephrosis and hydroureter bilaterally. No renal or ureteral stones are demonstrated. Foley catheter in the bladder. The bladder is mostly decompressed but there is evidence of prominent bladder wall thickening with heterogeneous appearance of the urine present in the bladder. This suggests probability of infection with hemorrhage.  Unenhanced appearance of the liver, spleen, gallbladder, pancreas, adrenal glands, abdominal aorta, inferior vena cava, and retroperitoneal lymph nodes is unremarkable. Stomach is filled with ingested material. No definitive wall thickening. Small bowel are decompressed. Stool in gas-filled colon without distention. No free air or free fluid in the abdomen. Postoperative changes in the anterior abdominal wall on the right suggesting hernia repair.  Pelvis: Prostate gland is enlarged at 4.6 x 5.4 cm. No free or loculated pelvic fluid collections. Appendix is not identified. No inflammatory changes in the pelvis. The degenerative change in the lumbar spine. No destructive bone lesions.  IMPRESSION: Prominent diffuse bladder wall thickening with probable hemorrhage or debris in the bladder lumen. Bilateral hydronephrosis and hydroureter. Changes likely to represent infection.   Electronically Signed   By: Lucienne Capers M.D.   On: 01/03/2014 01:26   US Renal  01/01/2014   CLINICAL DATA:  Evaluate for hydronephrosis.  EXAM: RENAL/URINARY TRACT ULTRASOUND COMPLETE  COMPARISON:  None.  FINDINGS: Right Kidney:  Length: 13.4 cm. Severe hydronephrosis and hydroureter of the visualized portion of the proximal segment. Echogenicity upper normal to mildly increased.  Left Kidney:  Length: 14.3 cm.  Moderate hydronephrosis.  Bladder:  And mild bladder wall  thickening is nonspecific. Prostate gland is enlarged and lobular in contour measuring up to 5.8 cm.  IMPRESSION: Severe right and moderate left hydronephrosis.  Nonspecific mild bladder wall thickening.  Prostatomegaly.   Electronically Signed   By: Carlos Levering M.D.   On: 01/01/2014 21:58    Microbiology: No results found for this or any previous visit (from the past 240 hour(s)).   Labs: Basic Metabolic Panel:  Recent Labs Lab 01/01/14 1316 01/02/14 0600 01/03/14 0824 01/04/14 0510  NA 142 143 142 141  K 4.8 5.0 4.5 4.4  CL 110 112 111 109  CO2 19 17* 18* 17*  GLUCOSE 109* 90 99 103*  BUN 24* 25* 22 25*  CREATININE 3.78* 3.91* 3.75* 3.43*  CALCIUM 8.6 7.9* 8.1* 8.2*   Liver Function Tests:  Recent Labs Lab 01/01/14 1316  AST 13  ALT 16  ALKPHOS 59  BILITOT <0.2*  PROT 7.6  ALBUMIN 3.5   No results found for this basename: LIPASE, AMYLASE,  in the  last 168 hours No results found for this basename: AMMONIA,  in the last 168 hours CBC:  Recent Labs Lab 01/01/14 1316 01/02/14 0600 01/04/14 0510  WBC 8.3 8.4 10.2  HGB 11.6* 10.3* 11.8*  HCT 35.3* 32.1* 37.3*  MCV 84.7 84.3 85.0  PLT 273 241 241   Cardiac Enzymes: No results found for this basename: CKTOTAL, CKMB, CKMBINDEX, TROPONINI,  in the last 168 hours BNP: BNP (last 3 results)  Recent Labs  08/21/13 1615  PROBNP 3632.0*   CBG: No results found for this basename: GLUCAP,  in the last 168 hours  Signed:  Kennya Schwenn K  [facility] 01/04/2014, 10:19 AM

## 2014-01-04 NOTE — Progress Notes (Signed)
  DC HOME WITH FAMILY, VERBALLY UNDERSTOOD DC INSTRUCTIONS, TEACHING ON CARE OF FOLEY CATHETER INSTRUCTED TO FAMILY AND PT, SELF HOME WITH LEG BAG AND STRAIGHT FOLEY. HE STATES HE KNOWS HOW TO CARE FOR FOLEY.

## 2014-01-04 NOTE — Progress Notes (Signed)
Subjective:  1- Acute on Chronic Renal Insufficiency / Bladder Outlet Obstruction  - baseline Cr 2 range. Noted to have bilat hydro to bladder and Cr near 4 on admission labs. Foley placed and now with 6L diuresis. Cr slowly trending down. UNa >20.   Today Troy Petty is w/o complaints. tolerating foley well w/o clots.   Objective: Vital signs in last 24 hours: Temp:  [98.5 F (36.9 C)-99.1 F (37.3 C)] 99.1 F (37.3 C) (09/06 0519) Pulse Rate:  [84-88] 84 (09/06 0519) Resp:  [16] 16 (09/06 0519) BP: (137-145)/(76-82) 141/76 mmHg (09/06 0519) SpO2:  [93 %-96 %] 93 % (09/06 0519) Last BM Date: 01/02/14  Intake/Output from previous day: 09/05 0701 - 09/06 0700 In: 3260 [P.O.:1962; I.V.:1300] Out: 4500 [Urine:4500] Intake/Output this shift:    General appearance: alert, cooperative, appears stated age and familiy at bedside Eyes: negative Neck: supple, symmetrical, trachea midline Back: symmetric, no curvature. ROM normal. No CVA tenderness. Resp: non-labored on room air.  Chest wall: no tenderness Cardio: Nl rate GI: soft, non-tender; bowel sounds normal; no masses,  no organomegaly Male genitalia: normal, foley c/d/i with light pink urine, no clots.  Extremities: extremities normal, atraumatic, no cyanosis or edema Pulses: 2+ and symmetric Skin: Skin color, texture, turgor normal. No rashes or lesions Lymph nodes: Cervical, supraclavicular, and axillary nodes normal. Neurologic: Grossly normal  Lab Results:   Recent Labs  01/02/14 0600 01/04/14 0510  WBC 8.4 10.2  HGB 10.3* 11.8*  HCT 32.1* 37.3*  PLT 241 241   BMET  Recent Labs  01/03/14 0824 01/04/14 0510  NA 142 141  K 4.5 4.4  CL 111 109  CO2 18* 17*  GLUCOSE 99 103*  BUN 22 25*  CREATININE 3.75* 3.43*  CALCIUM 8.1* 8.2*   PT/INR No results found for this basename: LABPROT, INR,  in the last 72 hours ABG No results found for this basename: PHART, PCO2, PO2, HCO3,  in the last 72  hours  Studies/Results: Ct Abdomen Pelvis Wo Contrast  01/03/2014   CLINICAL DATA:  Acute renal failure. Difficulty urinating. Blood in Foley catheter bag after Foley placement.  EXAM: CT ABDOMEN AND PELVIS WITHOUT CONTRAST  TECHNIQUE: Multidetector CT imaging of the abdomen and pelvis was performed following the standard protocol without IV contrast.  COMPARISON:  Ultrasound kidneys 01/01/2014  FINDINGS: Atelectasis in the lung bases.  Prominent hydronephrosis and hydroureter bilaterally. No renal or ureteral stones are demonstrated. Foley catheter in the bladder. The bladder is mostly decompressed but there is evidence of prominent bladder wall thickening with heterogeneous appearance of the urine present in the bladder. This suggests probability of infection with hemorrhage.  Unenhanced appearance of the liver, spleen, gallbladder, pancreas, adrenal glands, abdominal aorta, inferior vena cava, and retroperitoneal lymph nodes is unremarkable. Stomach is filled with ingested material. No definitive wall thickening. Small bowel are decompressed. Stool in gas-filled colon without distention. No free air or free fluid in the abdomen. Postoperative changes in the anterior abdominal wall on the right suggesting hernia repair.  Pelvis: Prostate gland is enlarged at 4.6 x 5.4 cm. No free or loculated pelvic fluid collections. Appendix is not identified. No inflammatory changes in the pelvis. The degenerative change in the lumbar spine. No destructive bone lesions.  IMPRESSION: Prominent diffuse bladder wall thickening with probable hemorrhage or debris in the bladder lumen. Bilateral hydronephrosis and hydroureter. Changes likely to represent infection.   Electronically Signed   By: Troy Petty M.D.   On: 01/03/2014 01:26  Anti-infectives: Anti-infectives   None      Assessment/Plan:  1- Acute on Chronic Renal Insufficiency / Bladder Outlet Obstruction  - will need to keep foley at DC and he is aware  of this. Plan for outpatient cysto / trial of void after DC. Appears intrinsic / post-renal acute exacerbation as urine Na >20.       [facility], Troy Petty 01/04/2014

## 2014-01-06 ENCOUNTER — Telehealth: Payer: Self-pay | Admitting: Cardiology

## 2014-01-06 ENCOUNTER — Encounter: Payer: Medicare Other | Admitting: *Deleted

## 2014-01-06 ENCOUNTER — Telehealth: Payer: Self-pay | Admitting: Internal Medicine

## 2014-01-06 NOTE — Telephone Encounter (Signed)
Patient called to find out if he needed to put his donut on his device for the transmission to go through. Nurse advised patient that he can put the donut on the device and wait for th transmission to go through. Patient informed that this message would be sent to [facility] for further instructions.

## 2014-01-06 NOTE — Telephone Encounter (Signed)
Attempted to call pt and confirm remote transmission for today. No answer and unable to leave message.

## 2014-01-06 NOTE — Telephone Encounter (Signed)
Spoke with patient today while discussing the protocol for his remote device check and patient wanted to inform our office again about him not getting his lab work in a timely manner. Nurse apologized to patient and informed him that our manager was aware of the situation and that we were very sorry for this happening. Nurse encouraged patient to sign of for Mychart and also advised him to contact our office in the future so that if this much time elapsed after getting labs or any testing done and he hasn't heard from [place] yet, he needed to contact us. Patient informed that management would be informed again of his concerns.

## 2014-01-06 NOTE — Telephone Encounter (Signed)
Called in reference to having device checked at home.  Has machine and knows it is due today, but does not know how to transmit. Please contact him at home

## 2014-01-09 ENCOUNTER — Encounter: Payer: Self-pay | Admitting: Cardiology

## 2014-01-12 ENCOUNTER — Telehealth: Payer: Self-pay | Admitting: *Deleted

## 2014-01-12 NOTE — Telephone Encounter (Signed)
Message copied by Laurine Blazer on Mon Jan 12, 2014  2:43 PM ------      Message from: Troy Petty      Created: Mon Jan 12, 2014  1:22 PM       BP log reviewed, numbers looked good. Will need to continue monitoring since medication changes made in the hospital, specifically his lisinopril being stopped and also [place] stopping his coreg. Can he keep a log for an additional week, just once a day to see how bp is doing.                  Zandra Abts MD ------

## 2014-01-13 NOTE — Telephone Encounter (Signed)
Attempted to notify patient - mail box full & can't accept messages.

## 2014-01-16 ENCOUNTER — Inpatient Hospital Stay (HOSPITAL_COMMUNITY): Payer: Medicare Other

## 2014-01-16 ENCOUNTER — Inpatient Hospital Stay (HOSPITAL_COMMUNITY)
Admission: EM | Admit: 2014-01-16 | Discharge: 2014-01-17 | DRG: 698 | Disposition: A | Discharge status: Home / Self Care | Payer: Medicare Other | Attending: Internal Medicine | Admitting: Internal Medicine

## 2014-01-16 ENCOUNTER — Ambulatory Visit (INDEPENDENT_AMBULATORY_CARE_PROVIDER_SITE_OTHER): Payer: Medicare Other | Admitting: *Deleted

## 2014-01-16 ENCOUNTER — Emergency Department (HOSPITAL_COMMUNITY): Payer: Medicare Other

## 2014-01-16 ENCOUNTER — Encounter: Payer: Self-pay | Admitting: Internal Medicine

## 2014-01-16 ENCOUNTER — Encounter (HOSPITAL_COMMUNITY): Payer: Self-pay | Admitting: Emergency Medicine

## 2014-01-16 DIAGNOSIS — I509 Heart failure, unspecified: Secondary | ICD-10-CM | POA: Diagnosis present

## 2014-01-16 DIAGNOSIS — Z7982 Long term (current) use of aspirin: Secondary | ICD-10-CM

## 2014-01-16 DIAGNOSIS — I5020 Unspecified systolic (congestive) heart failure: Secondary | ICD-10-CM

## 2014-01-16 DIAGNOSIS — T83511A Infection and inflammatory reaction due to indwelling urethral catheter, initial encounter: Principal | ICD-10-CM | POA: Diagnosis present

## 2014-01-16 DIAGNOSIS — J4489 Other specified chronic obstructive pulmonary disease: Secondary | ICD-10-CM | POA: Diagnosis present

## 2014-01-16 DIAGNOSIS — Y846 Urinary catheterization as the cause of abnormal reaction of the patient, or of later complication, without mention of misadventure at the time of the procedure: Secondary | ICD-10-CM | POA: Diagnosis present

## 2014-01-16 DIAGNOSIS — N179 Acute kidney failure, unspecified: Secondary | ICD-10-CM | POA: Diagnosis present

## 2014-01-16 DIAGNOSIS — R7989 Other specified abnormal findings of blood chemistry: Secondary | ICD-10-CM

## 2014-01-16 DIAGNOSIS — I495 Sick sinus syndrome: Secondary | ICD-10-CM

## 2014-01-16 DIAGNOSIS — N12 Tubulo-interstitial nephritis, not specified as acute or chronic: Secondary | ICD-10-CM | POA: Diagnosis present

## 2014-01-16 DIAGNOSIS — I2789 Other specified pulmonary heart diseases: Secondary | ICD-10-CM | POA: Diagnosis present

## 2014-01-16 DIAGNOSIS — J449 Chronic obstructive pulmonary disease, unspecified: Secondary | ICD-10-CM | POA: Diagnosis present

## 2014-01-16 DIAGNOSIS — I129 Hypertensive chronic kidney disease with stage 1 through stage 4 chronic kidney disease, or unspecified chronic kidney disease: Secondary | ICD-10-CM | POA: Diagnosis present

## 2014-01-16 DIAGNOSIS — R109 Unspecified abdominal pain: Secondary | ICD-10-CM | POA: Diagnosis not present

## 2014-01-16 DIAGNOSIS — I442 Atrioventricular block, complete: Secondary | ICD-10-CM

## 2014-01-16 DIAGNOSIS — Z87891 Personal history of nicotine dependence: Secondary | ICD-10-CM

## 2014-01-16 DIAGNOSIS — N183 Chronic kidney disease, stage 3 unspecified: Secondary | ICD-10-CM | POA: Diagnosis present

## 2014-01-16 DIAGNOSIS — N189 Chronic kidney disease, unspecified: Secondary | ICD-10-CM

## 2014-01-16 DIAGNOSIS — A419 Sepsis, unspecified organism: Secondary | ICD-10-CM | POA: Diagnosis present

## 2014-01-16 DIAGNOSIS — N401 Enlarged prostate with lower urinary tract symptoms: Secondary | ICD-10-CM | POA: Diagnosis present

## 2014-01-16 DIAGNOSIS — R339 Retention of urine, unspecified: Secondary | ICD-10-CM

## 2014-01-16 DIAGNOSIS — K219 Gastro-esophageal reflux disease without esophagitis: Secondary | ICD-10-CM | POA: Diagnosis present

## 2014-01-16 DIAGNOSIS — I5022 Chronic systolic (congestive) heart failure: Secondary | ICD-10-CM | POA: Diagnosis present

## 2014-01-16 DIAGNOSIS — Z79899 Other long term (current) drug therapy: Secondary | ICD-10-CM | POA: Diagnosis not present

## 2014-01-16 DIAGNOSIS — Z950 Presence of cardiac pacemaker: Secondary | ICD-10-CM | POA: Diagnosis not present

## 2014-01-16 DIAGNOSIS — N138 Other obstructive and reflux uropathy: Secondary | ICD-10-CM | POA: Diagnosis present

## 2014-01-16 DIAGNOSIS — B964 Proteus (mirabilis) (morganii) as the cause of diseases classified elsewhere: Secondary | ICD-10-CM | POA: Diagnosis present

## 2014-01-16 DIAGNOSIS — N390 Urinary tract infection, site not specified: Secondary | ICD-10-CM | POA: Diagnosis present

## 2014-01-16 LAB — CBC WITH DIFFERENTIAL/PLATELET
BASOS PCT: 0 % (ref 0–1)
Basophils Absolute: 0 10*3/uL (ref 0.0–0.1)
Eosinophils Absolute: 0.2 10*3/uL (ref 0.0–0.7)
Eosinophils Relative: 1 % (ref 0–5)
HEMATOCRIT: 36.1 % — AB (ref 39.0–52.0)
HEMOGLOBIN: 11.5 g/dL — AB (ref 13.0–17.0)
Lymphocytes Relative: 6 % — ABNORMAL LOW (ref 12–46)
Lymphs Abs: 1 10*3/uL (ref 0.7–4.0)
MCH: 27.8 pg (ref 26.0–34.0)
MCHC: 31.9 g/dL (ref 30.0–36.0)
MCV: 87.2 fL (ref 78.0–100.0)
MONO ABS: 1.1 10*3/uL — AB (ref 0.1–1.0)
MONOS PCT: 6 % (ref 3–12)
NEUTROS ABS: 15.1 10*3/uL — AB (ref 1.7–7.7)
Neutrophils Relative %: 87 % — ABNORMAL HIGH (ref 43–77)
Platelets: 299 10*3/uL (ref 150–400)
RBC: 4.14 MIL/uL — AB (ref 4.22–5.81)
RDW: 13.6 % (ref 11.5–15.5)
WBC: 17.4 10*3/uL — ABNORMAL HIGH (ref 4.0–10.5)

## 2014-01-16 LAB — MDC_IDC_ENUM_SESS_TYPE_REMOTE
Battery Remaining Longevity: 124 mo
Battery Remaining Percentage: 95.5 %
Battery Voltage: 3.01 V
Brady Statistic AP VP Percent: 1 %
Brady Statistic AP VS Percent: 1.3 %
Brady Statistic AS VS Percent: 49 %
Date Time Interrogation Session: 20150918125035
Implantable Pulse Generator Serial Number: 7520753
Lead Channel Impedance Value: 340 Ohm
Lead Channel Impedance Value: 560 Ohm
Lead Channel Pacing Threshold Amplitude: 0.625 V
Lead Channel Pacing Threshold Pulse Width: 0.4 ms
Lead Channel Sensing Intrinsic Amplitude: 5 mV
Lead Channel Setting Pacing Amplitude: 2.5 V
Lead Channel Setting Pacing Pulse Width: 0.4 ms
Lead Channel Setting Sensing Sensitivity: 4 mV
MDC IDC MSMT LEADCHNL RA PACING THRESHOLD AMPLITUDE: 0.5 V
MDC IDC MSMT LEADCHNL RA PACING THRESHOLD PULSEWIDTH: 0.4 ms
MDC IDC MSMT LEADCHNL RV SENSING INTR AMPL: 11.4 mV
MDC IDC SET LEADCHNL RV PACING AMPLITUDE: 0.875
MDC IDC STAT BRADY AS VP PERCENT: 50 %
MDC IDC STAT BRADY RA PERCENT PACED: 1.5 %
MDC IDC STAT BRADY RV PERCENT PACED: 50 %

## 2014-01-16 LAB — BASIC METABOLIC PANEL
Anion gap: 12 (ref 5–15)
BUN: 17 mg/dL (ref 6–23)
CO2: 23 mEq/L (ref 19–32)
Calcium: 9.2 mg/dL (ref 8.4–10.5)
Chloride: 100 mEq/L (ref 96–112)
Creatinine, Ser: 2.27 mg/dL — ABNORMAL HIGH (ref 0.50–1.35)
GFR calc Af Amer: 33 mL/min — ABNORMAL LOW (ref >90)
GFR calc non Af Amer: 29 mL/min — ABNORMAL LOW (ref >90)
GLUCOSE: 94 mg/dL (ref 70–99)
POTASSIUM: 5.1 meq/L (ref 3.7–5.3)
Sodium: 135 mEq/L — ABNORMAL LOW (ref 137–147)

## 2014-01-16 LAB — URINE MICROSCOPIC-ADD ON

## 2014-01-16 LAB — URINALYSIS, ROUTINE W REFLEX MICROSCOPIC
BILIRUBIN URINE: NEGATIVE
GLUCOSE, UA: NEGATIVE mg/dL
Ketones, ur: NEGATIVE mg/dL
Nitrite: POSITIVE — AB
Protein, ur: >300 mg/dL — AB
SPECIFIC GRAVITY, URINE: 1.014 (ref 1.005–1.030)
UROBILINOGEN UA: 0.2 mg/dL (ref 0.0–1.0)
pH: 7.5 (ref 5.0–8.0)

## 2014-01-16 LAB — I-STAT CG4 LACTIC ACID, ED: LACTIC ACID, VENOUS: 1.61 mmol/L (ref 0.5–2.2)

## 2014-01-16 LAB — LIPASE, BLOOD: Lipase: 57 U/L (ref 11–59)

## 2014-01-16 MED ORDER — PANTOPRAZOLE SODIUM 40 MG PO TBEC
80.0000 mg | DELAYED_RELEASE_TABLET | Freq: Every day | ORAL | Status: DC
  Administered 2014-01-17: 80 mg via ORAL
  Filled 2014-01-16: qty 2

## 2014-01-16 MED ORDER — OXYBUTYNIN CHLORIDE 5 MG PO TABS
5.0000 mg | ORAL_TABLET | Freq: Three times a day (TID) | ORAL | Status: DC | PRN
  Administered 2014-01-16: 5 mg via ORAL
  Filled 2014-01-16: qty 1

## 2014-01-16 MED ORDER — ONDANSETRON HCL 4 MG/2ML IJ SOLN: 4.0000 mg | Freq: Three times a day (TID) | INTRAMUSCULAR | Status: AC | PRN

## 2014-01-16 MED ORDER — PANTOPRAZOLE SODIUM 40 MG PO TBEC: 80.0000 mg | DELAYED_RELEASE_TABLET | Freq: Every day | ORAL | Status: DC

## 2014-01-16 MED ORDER — SODIUM CHLORIDE 0.9 % IJ SOLN: 3.0000 mL | Freq: Two times a day (BID) | INTRAMUSCULAR | Status: DC

## 2014-01-16 MED ORDER — SODIUM CHLORIDE 0.9 % IV SOLN
INTRAVENOUS | Status: DC
  Administered 2014-01-16 – 2014-01-17 (×2): via INTRAVENOUS

## 2014-01-16 MED ORDER — HEPARIN SODIUM (PORCINE) 5000 UNIT/ML IJ SOLN: 5000.0000 [IU] | Freq: Three times a day (TID) | INTRAMUSCULAR | Status: DC

## 2014-01-16 MED ORDER — SODIUM CHLORIDE 0.9 % IV BOLUS (SEPSIS)
500.0000 mL | Freq: Once | INTRAVENOUS | Status: AC
  Administered 2014-01-16: 500 mL via INTRAVENOUS

## 2014-01-16 MED ORDER — FINASTERIDE 5 MG PO TABS
5.0000 mg | ORAL_TABLET | Freq: Every day | ORAL | Status: DC
  Administered 2014-01-17: 5 mg via ORAL
  Filled 2014-01-16: qty 1

## 2014-01-16 MED ORDER — TAMSULOSIN HCL 0.4 MG PO CAPS
0.4000 mg | ORAL_CAPSULE | Freq: Every day | ORAL | Status: DC
  Administered 2014-01-16: 0.4 mg via ORAL
  Filled 2014-01-16 (×2): qty 1

## 2014-01-16 MED ORDER — ASPIRIN EC 81 MG PO TBEC
81.0000 mg | DELAYED_RELEASE_TABLET | Freq: Every day | ORAL | Status: DC
  Administered 2014-01-17: 81 mg via ORAL
  Filled 2014-01-16: qty 1

## 2014-01-16 MED ORDER — DEXTROSE 5 % IV SOLN
1.0000 g | INTRAVENOUS | Status: DC
  Administered 2014-01-17: 1 g via INTRAVENOUS
  Filled 2014-01-16 (×2): qty 10

## 2014-01-16 MED ORDER — DEXTROSE 5 % IV SOLN: 1.0000 g | INTRAVENOUS | Status: DC

## 2014-01-16 MED ORDER — FENTANYL CITRATE 0.05 MG/ML IJ SOLN
50.0000 ug | Freq: Once | INTRAMUSCULAR | Status: AC
  Administered 2014-01-16: 50 ug via INTRAVENOUS
  Filled 2014-01-16: qty 2

## 2014-01-16 MED ORDER — CEFEPIME HCL 2 G IJ SOLR: 2.0000 g | Freq: Two times a day (BID) | INTRAMUSCULAR | Status: DC

## 2014-01-16 MED ORDER — DEXTROSE 5 % IV SOLN
2.0000 g | Freq: Once | INTRAVENOUS | Status: AC
  Administered 2014-01-16: 2 g via INTRAVENOUS
  Filled 2014-01-16: qty 2

## 2014-01-16 NOTE — ED Notes (Signed)
Alert, NAD, calm, interactive, resps e/u, speaking in clear complete sentences, VSS. (denies: pain, sob, nausea or dizziness). Family at [facility] x2. Updated.

## 2014-01-16 NOTE — ED Notes (Signed)
Lab results reported to [place].

## 2014-01-16 NOTE — Telephone Encounter (Signed)
Attempted to contact patient again - mail box full.

## 2014-01-16 NOTE — ED Provider Notes (Signed)
CSN: [id number]     Arrival date & time 01/16/14  1352 History   None    Chief Complaint  Patient presents with  . Flank Pain     (Consider location/radiation/quality/duration/timing/severity/associated sxs/prior Treatment) HPI Troy Petty 63 y.o. with a pmh of urinary retention (s/p foley placed during recent hospitalization 2 weeks ago), CKD, COPD who presents to the ED for right flank pain. Pain started with morning between 8:30 and 9:00 am while he was having a cup of coffee. It is sharp in character. It started suddenly. It was 10/10 but improved with home oxybutynin. Currently minimal in severity. No known exacerbating or relieving factors. No N/V/D. Nml bowel movements. No diarrhea. Output from the foley has been at baseline. There has been a change in the color of UOP to red in color which started today. No fever. No history of kidney stones. No Stones noted on CT abd and renal US during recent hospitalization.   Past Medical History  Diagnosis Date  . Complete heart block   . Chronic renal insufficiency     baseline creatinine is 1.8  . GERD (gastroesophageal reflux disease)   . Chronic systolic dysfunction of left ventricle 4/15    EF 40-45% in setting of PMT on limited echo  . Hypertrophy of prostate with urinary obstruction and other lower urinary tract symptoms (LUTS)   . Chronic kidney disease, stage III (moderate)   . Vitamin B12 deficiency   . Hypertension   . Mild pulmonary hypertension   . Metabolic syndrome   . COPD (chronic obstructive pulmonary disease)    Past Surgical History  Procedure Laterality Date  . Pacemaker insertion  03/14/13    SJM Endurity implanted by Dr Truman Hayward in [place] for complete heart block  . Other surgical history      Intestinal blockage x2  . Other surgical history  1964    Football injury   Family History  Problem Relation Age of Onset  . Cancer Father    History  Substance Use Topics  . Smoking status: Former Smoker --  40 years    Types: Cigarettes    Quit date: 05/02/2011  . Smokeless tobacco: Not on file  . Alcohol Use: No    Review of Systems  All other systems reviewed and are negative.     Allergies  Review of patient's allergies indicates no known allergies.  Home Medications   Prior to Admission medications   Medication Sig Start Date End Date Taking? Authorizing Provider  aspirin EC 81 MG tablet Take 1 tablet (81 mg total) by mouth daily. 08/22/13  Yes Janece Canterbury, MD  dexlansoprazole (DEXILANT) 60 MG capsule Take 60 mg by mouth daily.   Yes Historical Provider, MD  finasteride (PROSCAR) 5 MG tablet Take 1 tablet (5 mg total) by mouth daily. 01/04/14  Yes Donne Hazel, MD  oxybutynin (DITROPAN) 5 MG tablet Take 5 mg by mouth 3 (three) times daily as needed for bladder spasms.   Yes Historical Provider, MD  tamsulosin (FLOMAX) 0.4 MG CAPS capsule Take 0.4 mg by mouth at bedtime.    Yes Historical Provider, MD   BP 126/79  Pulse 102  Temp(Src) 99.1 F (37.3 C) (Oral)  Resp 28  SpO2 93% Physical Exam  Nursing note and vitals reviewed. Constitutional: He is oriented to person, place, and time. He appears well-developed and well-nourished. No distress.  HENT:  Head: Normocephalic and atraumatic.  Eyes: Conjunctivae and EOM are normal. Right eye exhibits no  discharge. Left eye exhibits no discharge.  Neck: Normal range of motion. Neck supple. No tracheal deviation present.  Cardiovascular: Normal rate, regular rhythm and normal heart sounds.  Exam reveals no friction rub.   No murmur heard. Pulmonary/Chest: Effort normal and breath sounds normal. No stridor. No respiratory distress. He has no wheezes. He has no rales. He exhibits no tenderness.  Abdominal: Soft. He exhibits no distension. There is no tenderness. There is CVA tenderness (right sided). There is no rebound and no guarding.  Genitourinary:  Foley catheter in place with a leg bag. Urine in bag is red, but transparent.    Neurological: He is alert and oriented to person, place, and time.  Skin: Skin is warm.  Psychiatric: He has a normal mood and affect.    ED Course  Procedures (including critical care time) Labs Review Labs Reviewed  CBC WITH DIFFERENTIAL - Abnormal; Notable for the following:    WBC 17.4 (*)    RBC 4.14 (*)    Hemoglobin 11.5 (*)    HCT 36.1 (*)    Neutrophils Relative % 87 (*)    Neutro Abs 15.1 (*)    Lymphocytes Relative 6 (*)    Monocytes Absolute 1.1 (*)    All other components within normal limits  URINALYSIS, ROUTINE W REFLEX MICROSCOPIC - Abnormal; Notable for the following:    Color, Urine RED (*)    APPearance TURBID (*)    Hgb urine dipstick LARGE (*)    Protein, ur >300 (*)    Nitrite POSITIVE (*)    Leukocytes, UA MODERATE (*)    All other components within normal limits  BASIC METABOLIC PANEL - Abnormal; Notable for the following:    Sodium 135 (*)    Creatinine, Ser 2.27 (*)    GFR calc non Af Amer 29 (*)    GFR calc Af Amer 33 (*)    All other components within normal limits  URINE MICROSCOPIC-ADD ON - Abnormal; Notable for the following:    Bacteria, UA MANY (*)    All other components within normal limits  URINE CULTURE  LIPASE, BLOOD  I-STAT CG4 LACTIC ACID, ED    Imaging Review No results found.   EKG Interpretation   Date/Time:  Friday January 16 2014 14:16:35 EDT Ventricular Rate:  107 PR Interval:  146 QRS Duration: 140 QT Interval:  349 QTC Calculation: 466 R Axis:   -72 Text Interpretation:  Ectopic atrial tachycardia, unifocal RBBB and LAFB  Left ventricular hypertrophy Atrial tachycardia Non-specific  intra-ventricular conduction delay Left ventricular hypertrophy Abnormal  ekg Confirmed by Carmin Muskrat  MD [phone]) on 01/16/2014 3:47:46 PM      MDM   Final diagnoses:  None   Pt presents with right flank pain and change in the color of his urine. Pain improving. AFVSS here. NAD. Right CVA ttp. Abd non tender and no  evidence of peritonitis. UA c/w infection, given CVA ttp - clinically pyelo. Recent imaging meg for nephrolithiasis, so doubt this as a complicating feature. Plan for cefepime IV.   Patient continue to be tachycardic after 1 L IVF. Given this he meets SIRS criteria and hospitalist admitted for IV abx. He was HDS while under my care.   Care discussed with my attending, Dr. Reather Converse. If performed and available, imaging studies and labs reviewed.     Kelby Aline, MD 01/17/14 [phone]

## 2014-01-16 NOTE — ED Notes (Signed)
Pt having right flank pain that started this am. Pt has foley that was placed 2 weeks ago. sts some dark urine in foley this am.

## 2014-01-16 NOTE — Telephone Encounter (Signed)
Patient notified

## 2014-01-16 NOTE — H&P (Addendum)
[facility] History and Physical  Maui Britten VHQ:[id number] DOB: [birth year] DOA: 01/16/2014  Referring physician: EDP PCP: Jacqualine Code, DO   Chief Complaint: Flank pain   HPI: Troy Petty is a 63 y.o. male who presents to the ED with c/o R sided flank pain onset this morning.  His flank pain occurs in the context of indwelling foley catheter that was placed 2 weeks ago during a recent hospitalization for urinary retention.  Pain onset this morning between 8:30 and 9 am.  Onset was sudden, sharp in quality.  10/10 in severity but did improve with oxybutynin.  Currently minimal in severity.  He has no history of kidney stones and none noted on CT abd nor renal ultrasound during recent hospital stay.  Work up in the ED demonstrated an impressive UTI on UA.  Culture pending, he was given a dose of cefepime in the ED.  Review of Systems: Systems reviewed.  As above, otherwise negative  Past Medical History  Diagnosis Date  . Complete heart block   . Chronic renal insufficiency     baseline creatinine is 1.8  . GERD (gastroesophageal reflux disease)   . Chronic systolic dysfunction of left ventricle 4/15    EF 40-45% in setting of PMT on limited echo  . Hypertrophy of prostate with urinary obstruction and other lower urinary tract symptoms (LUTS)   . Chronic kidney disease, stage III (moderate)   . Vitamin B12 deficiency   . Hypertension   . Mild pulmonary hypertension   . Metabolic syndrome   . COPD (chronic obstructive pulmonary disease)    Past Surgical History  Procedure Laterality Date  . Pacemaker insertion  03/14/13    SJM Endurity implanted by Dr Truman Hayward in [place] for complete heart block  . Other surgical history      Intestinal blockage x2  . Other surgical history  1964    Football injury   Social History:  reports that he quit smoking about 2 years ago. His smoking use included Cigarettes. He smoked 0.00 packs per day for 40 years. He does  not have any smokeless tobacco history on file. He reports that he does not drink alcohol or use illicit drugs.  No Known Allergies  Family History  Problem Relation Age of Onset  . Cancer Father      Prior to Admission medications   Medication Sig Start Date End Date Taking? Authorizing Provider  aspirin EC 81 MG tablet Take 1 tablet (81 mg total) by mouth daily. 08/22/13  Yes Janece Canterbury, MD  dexlansoprazole (DEXILANT) 60 MG capsule Take 60 mg by mouth daily.   Yes Historical Provider, MD  finasteride (PROSCAR) 5 MG tablet Take 1 tablet (5 mg total) by mouth daily. 01/04/14  Yes Donne Hazel, MD  oxybutynin (DITROPAN) 5 MG tablet Take 5 mg by mouth 3 (three) times daily as needed for bladder spasms.   Yes Historical Provider, MD  tamsulosin (FLOMAX) 0.4 MG CAPS capsule Take 0.4 mg by mouth at bedtime.    Yes Historical Provider, MD   Physical Exam: Filed Vitals:   01/16/14 1925  BP:   Pulse:   Temp: 99.7 F (37.6 C)  Resp:     BP 138/72  Pulse 102  Temp(Src) 99.7 F (37.6 C) (Oral)  Resp 16  SpO2 95%  General Appearance:    Alert, oriented, no distress, appears stated age  Head:    Normocephalic, atraumatic  Eyes:    PERRL, EOMI, sclera non-icteric  Nose:   Nares without drainage or epistaxis. Mucosa, turbinates normal  Throat:   Moist mucous membranes. Oropharynx without erythema or exudate.  Neck:   Supple. No carotid bruits.  No thyromegaly.  No lymphadenopathy.   Back:     No CVA tenderness, no spinal tenderness  Lungs:     Clear to auscultation bilaterally, without wheezes, rhonchi or rales  Chest wall:    No tenderness to palpitation  Heart:    Regular rate and rhythm without murmurs, gallops, rubs  Abdomen:     Soft, non-tender, nondistended, normal bowel sounds, no organomegaly  Genitalia:    deferred  Rectal:    deferred  Extremities:   No clubbing, cyanosis or edema.  Pulses:   2+ and symmetric all extremities  Skin:   Skin color, texture, turgor  normal, no rashes or lesions  Lymph nodes:   Cervical, supraclavicular, and axillary nodes normal  Neurologic:   CNII-XII intact. Normal strength, sensation and reflexes      throughout    Labs on Admission:  Basic Metabolic Panel:  Recent Labs Lab 01/16/14 1417  NA 135*  K 5.1  CL 100  CO2 23  GLUCOSE 94  BUN 17  CREATININE 2.27*  CALCIUM 9.2   Liver Function Tests: No results found for this basename: AST, ALT, ALKPHOS, BILITOT, PROT, ALBUMIN,  in the last 168 hours  Recent Labs Lab 01/16/14 1444  LIPASE 57   No results found for this basename: AMMONIA,  in the last 168 hours CBC:  Recent Labs Lab 01/16/14 1417  WBC 17.4*  NEUTROABS 15.1*  HGB 11.5*  HCT 36.1*  MCV 87.2  PLT 299   Cardiac Enzymes: No results found for this basename: CKTOTAL, CKMB, CKMBINDEX, TROPONINI,  in the last 168 hours  BNP (last 3 results)  Recent Labs  08/21/13 1615  PROBNP 3632.0*   CBG: No results found for this basename: GLUCAP,  in the last 168 hours  Radiological Exams on Admission: Dg Chest 2 View  01/16/2014   CLINICAL DATA:  Right lower flank pain.  EXAM: CHEST  2 VIEW  COMPARISON:  08/21/2013  FINDINGS: Left pacer is in place with leads in the right atrium and right ventricle. Heart and mediastinal contours are within normal limits. No focal opacities or effusions. No acute bony abnormality.  IMPRESSION: No active cardiopulmonary disease.   Electronically Signed   By: Rolm Baptise M.D.   On: 01/16/2014 18:15    EKG: Independently reviewed.  Assessment/Plan Principal Problem:   Pyelonephritis Active Problems:   Heart failure with reduced ejection fraction   UTI (urinary tract infection) due to urinary indwelling Foley catheter   Sepsis secondary to UTI   CKD (chronic kidney disease), stage III   1. Pyelonephritis, UTI due to indwelling foley catheter, and sepsis secondary to this - 1. Urine culture pending 2. IVF 3. Rocephin 4. Renal ultrasound to evaluate  further 5. SCDs for DVT ppx since he has red urine at this point with hematuria.  Is not opaque and no clots though and flowing freely without evidence of obstruction. 6. Repeat CBC BMP in AM 2. CKD stage 3 1. Creatinine looks to be close to baseline if in fact his baseline is 2.0 as it was in April of this year. 3. Chronic CHF 1. Currently stable 2. Watch for fluid overload with IVF being used to treat #1 above.    Code Status: Full Code  Family Communication: No family in room Disposition Plan: Admit  to inpatient   Time spent: 70 min  GARDNER, JARED M. [facility] Pager [phone]  If 7AM-7PM, please contact the day team taking care of the patient [email or web address] Password [facility] 01/16/2014, 7:45 PM

## 2014-01-17 ENCOUNTER — Inpatient Hospital Stay (HOSPITAL_COMMUNITY): Payer: Medicare Other

## 2014-01-17 DIAGNOSIS — R339 Retention of urine, unspecified: Secondary | ICD-10-CM

## 2014-01-17 LAB — BASIC METABOLIC PANEL
Anion gap: 11 (ref 5–15)
BUN: 18 mg/dL (ref 6–23)
CHLORIDE: 106 meq/L (ref 96–112)
CO2: 24 meq/L (ref 19–32)
Calcium: 8.3 mg/dL — ABNORMAL LOW (ref 8.4–10.5)
Creatinine, Ser: 2.2 mg/dL — ABNORMAL HIGH (ref 0.50–1.35)
GFR calc Af Amer: 34 mL/min — ABNORMAL LOW (ref >90)
GFR calc non Af Amer: 30 mL/min — ABNORMAL LOW (ref >90)
Glucose, Bld: 99 mg/dL (ref 70–99)
Potassium: 4.8 mEq/L (ref 3.7–5.3)
Sodium: 141 mEq/L (ref 137–147)

## 2014-01-17 LAB — CBC
HEMATOCRIT: 30.5 % — AB (ref 39.0–52.0)
Hemoglobin: 9.6 g/dL — ABNORMAL LOW (ref 13.0–17.0)
MCH: 26.7 pg (ref 26.0–34.0)
MCHC: 31.5 g/dL (ref 30.0–36.0)
MCV: 85 fL (ref 78.0–100.0)
Platelets: 248 10*3/uL (ref 150–400)
RBC: 3.59 MIL/uL — ABNORMAL LOW (ref 4.22–5.81)
RDW: 13.7 % (ref 11.5–15.5)
WBC: 9.8 10*3/uL (ref 4.0–10.5)

## 2014-01-17 LAB — GLUCOSE, CAPILLARY: Glucose-Capillary: 93 mg/dL (ref 70–99)

## 2014-01-17 MED ORDER — CIPROFLOXACIN HCL 250 MG PO TABS: 500.0000 mg | ORAL_TABLET | Freq: Two times a day (BID) | ORAL | Status: DC

## 2014-01-17 MED ORDER — HYDROCODONE-ACETAMINOPHEN 5-325 MG PO TABS: 1.0000 | ORAL_TABLET | Freq: Four times a day (QID) | ORAL | Status: DC | PRN

## 2014-01-17 NOTE — Discharge Summary (Signed)
Physician Discharge Summary  Troy Petty ELF:[id number] DOB: 1951/02/20 DOA: 01/16/2014  PCP: Jacqualine Code, DO  Admit date: 01/16/2014 Discharge date: 01/17/2014  Time spent: greater than 30 minutes  Recommendations for Outpatient Follow-up:  1.   Discharge Diagnoses:  Principal Problem:   Pyelonephritis Active Problems:   Heart failure with reduced ejection fraction   UTI (urinary tract infection) due to urinary indwelling Foley catheter   Sepsis secondary to UTI   CKD (chronic kidney disease), stage III   Discharge Condition: stable  Filed Weights   01/16/14 2011  Weight: 90.9 kg (200 lb 6.4 oz)    History of present illness:  63 y.o. male who presents to the ED with c/o R sided flank pain onset this morning. His flank pain occurs in the context of indwelling foley catheter that was placed 2 weeks ago during a recent hospitalization for urinary retention. Pain onset this morning between 8:30 and 9 am. Onset was sudden, sharp in quality. 10/10 in severity but did improve with oxybutynin. Currently minimal in severity. He has no history of kidney stones and none noted on CT abd nor renal ultrasound during recent hospital stay. Work up in the ED demonstrated an impressive UTI on UA.  Hospital Course:  Admitted to hospitalists. Started on antibiotics, fluids. Pain improved quickly and patient requesting discharge. Needs f/u with urology as outpatient.  Procedures:  none  Consultations:  none  Discharge Exam: Filed Vitals:   01/17/14 0637  BP: 137/70  Pulse: 100  Temp: 99.8 F (37.7 C)  Resp: 16    General: comfortable Cardiovascular: nontoxic Respiratory: CTA without wrr Back no CVA tenderness Ext no CCE   Discharge Instructions   Activity as tolerated - No restrictions    Complete by:  As directed           Current Discharge Medication List    START taking these medications   Details  ciprofloxacin (CIPRO) 250 MG tablet Take 2 tablets (500  mg total) by mouth 2 (two) times daily. Qty: 14 tablet, Refills: 0    HYDROcodone-acetaminophen (NORCO/VICODIN) 5-325 MG per tablet Take 1 tablet by mouth every 6 (six) hours as needed for moderate pain. Qty: 20 tablet, Refills: 0      CONTINUE these medications which have NOT CHANGED   Details  aspirin EC 81 MG tablet Take 1 tablet (81 mg total) by mouth daily. Qty: 30 tablet, Refills: 0    dexlansoprazole (DEXILANT) 60 MG capsule Take 60 mg by mouth daily.    finasteride (PROSCAR) 5 MG tablet Take 1 tablet (5 mg total) by mouth daily. Qty: 30 tablet, Refills: 0    oxybutynin (DITROPAN) 5 MG tablet Take 5 mg by mouth 3 (three) times daily as needed for bladder spasms.    tamsulosin (FLOMAX) 0.4 MG CAPS capsule Take 0.4 mg by mouth at bedtime.        No Known Allergies    The results of significant diagnostics from this hospitalization (including imaging, microbiology, ancillary and laboratory) are listed below for reference.    Significant Diagnostic Studies: Ct Abdomen Pelvis Wo Contrast  01/03/2014   CLINICAL DATA:  Acute renal failure. Difficulty urinating. Blood in Foley catheter bag after Foley placement.  EXAM: CT ABDOMEN AND PELVIS WITHOUT CONTRAST  TECHNIQUE: Multidetector CT imaging of the abdomen and pelvis was performed following the standard protocol without IV contrast.  COMPARISON:  Ultrasound kidneys 01/01/2014  FINDINGS: Atelectasis in the lung bases.  Prominent hydronephrosis and hydroureter bilaterally. No renal  or ureteral stones are demonstrated. Foley catheter in the bladder. The bladder is mostly decompressed but there is evidence of prominent bladder wall thickening with heterogeneous appearance of the urine present in the bladder. This suggests probability of infection with hemorrhage.  Unenhanced appearance of the liver, spleen, gallbladder, pancreas, adrenal glands, abdominal aorta, inferior vena cava, and retroperitoneal lymph nodes is unremarkable. Stomach  is filled with ingested material. No definitive wall thickening. Small bowel are decompressed. Stool in gas-filled colon without distention. No free air or free fluid in the abdomen. Postoperative changes in the anterior abdominal wall on the right suggesting hernia repair.  Pelvis: Prostate gland is enlarged at 4.6 x 5.4 cm. No free or loculated pelvic fluid collections. Appendix is not identified. No inflammatory changes in the pelvis. The degenerative change in the lumbar spine. No destructive bone lesions.  IMPRESSION: Prominent diffuse bladder wall thickening with probable hemorrhage or debris in the bladder lumen. Bilateral hydronephrosis and hydroureter. Changes likely to represent infection.   Electronically Signed   By: Lucienne Capers M.D.   On: 01/03/2014 01:26   Dg Chest 2 View  01/16/2014   CLINICAL DATA:  Right lower flank pain.  EXAM: CHEST  2 VIEW  COMPARISON:  08/21/2013  FINDINGS: Left pacer is in place with leads in the right atrium and right ventricle. Heart and mediastinal contours are within normal limits. No focal opacities or effusions. No acute bony abnormality.  IMPRESSION: No active cardiopulmonary disease.   Electronically Signed   By: Rolm Baptise M.D.   On: 01/16/2014 18:15   US Renal  01/17/2014   CLINICAL DATA:  Urinary tract infection. Clinical concern for pyelonephritis.  EXAM: RENAL/URINARY TRACT ULTRASOUND COMPLETE  COMPARISON:  CT dated 01/02/2014.  FINDINGS: Right Kidney:  Length: 12.3 cm. Normal echotexture. Moderately dilated collecting system. No perinephric fluid.  Left Kidney:  Length: 12.7 cm. Normal echotexture. Mildly dilated collecting system. No perinephric fluid.  Bladder:  Marked diffuse wall thickening surrounding a Foley catheter without significant urine in the bladder. The maximum wall thickness is 2.3 cm.  IMPRESSION: 1. Moderate right hydronephrosis and mild left hydronephrosis. This appears improved since the previous CT. 2. Stable marked diffuse  bladder wall thickening. Differential considerations include cystitis and chronic bladder outlet obstruction. Malignancy is less likely.   Electronically Signed   By: Enrique Sack M.D.   On: 01/17/2014 07:33   US Renal  01/01/2014   CLINICAL DATA:  Evaluate for hydronephrosis.  EXAM: RENAL/URINARY TRACT ULTRASOUND COMPLETE  COMPARISON:  None.  FINDINGS: Right Kidney:  Length: 13.4 cm. Severe hydronephrosis and hydroureter of the visualized portion of the proximal segment. Echogenicity upper normal to mildly increased.  Left Kidney:  Length: 14.3 cm.  Moderate hydronephrosis.  Bladder:  And mild bladder wall thickening is nonspecific. Prostate gland is enlarged and lobular in contour measuring up to 5.8 cm.  IMPRESSION: Severe right and moderate left hydronephrosis.  Nonspecific mild bladder wall thickening.  Prostatomegaly.   Electronically Signed   By: Carlos Levering M.D.   On: 01/01/2014 21:58    Microbiology: No results found for this or any previous visit (from the past 240 hour(s)).   Labs: Basic Metabolic Panel:  Recent Labs Lab 01/16/14 1417 01/17/14 0550  NA 135* 141  K 5.1 4.8  CL 100 106  CO2 23 24  GLUCOSE 94 99  BUN 17 18  CREATININE 2.27* 2.20*  CALCIUM 9.2 8.3*   Liver Function Tests: No results found for this basename: AST,  ALT, ALKPHOS, BILITOT, PROT, ALBUMIN,  in the last 168 hours  Recent Labs Lab 01/16/14 1444  LIPASE 57   No results found for this basename: AMMONIA,  in the last 168 hours CBC:  Recent Labs Lab 01/16/14 1417 01/17/14 0550  WBC 17.4* 9.8  NEUTROABS 15.1*  --   HGB 11.5* 9.6*  HCT 36.1* 30.5*  MCV 87.2 85.0  PLT 299 248   Cardiac Enzymes: No results found for this basename: CKTOTAL, CKMB, CKMBINDEX, TROPONINI,  in the last 168 hours BNP: BNP (last 3 results)  Recent Labs  08/21/13 1615  PROBNP 3632.0*   CBG:  Recent Labs Lab 01/17/14 0812  GLUCAP 93       Signed:  Izumi Mixon L  [facility] 01/17/2014, 1:28 PM

## 2014-01-17 NOTE — ED Provider Notes (Signed)
Medical screening examination/treatment/procedure(s) were conducted as a shared visit with non-physician practitioner(s) or resident and myself. I personally evaluated the patient during the encounter and agree with the findings.  I have personally reviewed any xrays and/ or EKG's with the provider and I agree with interpretation.  Patient with right flank pain sinus morning constant ache, no history of kidney stones however patient recently had a Foley placed 2 weeks ago for prostate issues. Patient denies fevers chills or vomiting. Patient feels well otherwise. On exam abdomen soft nontender, mild right flank tenderness. Urine infected. Plan for IV antibiotics, change of Foley and close followup outpatient.  Patient has urology followup. Sepsis. Complicated UTI/pyelonephritis, Foley catheter use   Mariea Clonts, MD 01/17/14 [phone]

## 2014-01-17 NOTE — Progress Notes (Signed)
Patient arrived to unit via stretcher to [phone]. Pt alert and oriented x 4. Skin intact. IV intact. In no distress at this time. No family at bedside. Call bell with in reach, pt understands how to get into contact with nursing staff. V/s taken and stable. Will continue to monitor patient per MD orders.

## 2014-01-17 NOTE — Progress Notes (Signed)
01/17/14 Patient being discharged home today, IV site removed and discharged  Instructions reviewed with patient.

## 2014-01-18 LAB — URINE CULTURE: Colony Count: 85000

## 2014-01-21 ENCOUNTER — Other Ambulatory Visit: Payer: Self-pay | Admitting: Urology

## 2014-01-21 NOTE — Progress Notes (Signed)
Remote pacemaker transmission.   

## 2014-02-02 ENCOUNTER — Encounter: Payer: Self-pay | Admitting: Cardiology

## 2014-02-03 ENCOUNTER — Ambulatory Visit (INDEPENDENT_AMBULATORY_CARE_PROVIDER_SITE_OTHER): Payer: Medicare Other | Admitting: Cardiology

## 2014-02-03 ENCOUNTER — Encounter: Payer: Self-pay | Admitting: Cardiology

## 2014-02-03 VITALS — BP 105/71 | HR 99 | Ht 71.0 in | Wt 197.0 lb

## 2014-02-03 DIAGNOSIS — I2510 Atherosclerotic heart disease of native coronary artery without angina pectoris: Secondary | ICD-10-CM

## 2014-02-03 DIAGNOSIS — I442 Atrioventricular block, complete: Secondary | ICD-10-CM

## 2014-02-03 DIAGNOSIS — I10 Essential (primary) hypertension: Secondary | ICD-10-CM

## 2014-02-03 NOTE — Progress Notes (Signed)
Clinical Summary Mr. Pastorino is a 63 y.o.male seen today for follow up of the following medical problems.   1. Low normal to mildly decreased LV systolic function  - echo 09/2013 LVEF 09-98%, grade I diastolic dysfunction. Apex appears to be hypokinetic though poor visualization  - prior cath in [facility] 10/2012 with only mid LAD 20% lesion, otherwise patent vessels.  Has some DOE, though can walk 45 min to 1 hr without troubles at a regular pace. No LE edema, no orthopnea, no PND. No chest pain  - had tried starting home on coreg, however had generalized nausea and fatigue. This was in the setting of later diagnosed urinary obstruction and AKI, unclear if coreg was related to symptoms, however the medication was stopped at that time and not restarted.    2. Non-obstructive CAD  - mild LAD lesion from cath 2014  - denies any chest pain   3. Complete heart block  - [employer] pacemaker followed by Dr Rayann Heman  - normal function by last check 12/2013  4. CKD  - followed by pcp  - baseline Cr appears to be around 2, stable   5. HTN  - bp log numbers submitted and at goal.  - compliant with meds  6. Urinary obstruction - admitted early 12/2013 with AKI, found to have blaccer outlet obstruction secondary to enlarged prostate. Has chronic foley catheter. Cr peaked at 3.9, trended down to 2.2.  - recent admit 01/17/14 with flank pain, treated for pyelonephritis - being considered for upcoming uro procedure  Past Medical History  Diagnosis Date  . Complete heart block   . Chronic renal insufficiency     baseline creatinine is 1.8  . GERD (gastroesophageal reflux disease)   . Chronic systolic dysfunction of left ventricle 4/15    EF 40-45% in setting of PMT on limited echo  . Hypertrophy of prostate with urinary obstruction and other lower urinary tract symptoms (LUTS)   . Chronic kidney disease, stage III (moderate)   . Vitamin B12 deficiency   . Hypertension   . Mild  pulmonary hypertension   . Metabolic syndrome   . COPD (chronic obstructive pulmonary disease)      No Known Allergies   Current Outpatient Prescriptions  Medication Sig Dispense Refill  . aspirin EC 81 MG tablet Take 1 tablet (81 mg total) by mouth daily.  30 tablet  0  . ciprofloxacin (CIPRO) 250 MG tablet Take 2 tablets (500 mg total) by mouth 2 (two) times daily.  14 tablet  0  . dexlansoprazole (DEXILANT) 60 MG capsule Take 60 mg by mouth daily.      . finasteride (PROSCAR) 5 MG tablet Take 1 tablet (5 mg total) by mouth daily.  30 tablet  0  . HYDROcodone-acetaminophen (NORCO/VICODIN) 5-325 MG per tablet Take 1 tablet by mouth every 6 (six) hours as needed for moderate pain.  20 tablet  0  . oxybutynin (DITROPAN) 5 MG tablet Take 5 mg by mouth 3 (three) times daily as needed for bladder spasms.      . tamsulosin (FLOMAX) 0.4 MG CAPS capsule Take 0.4 mg by mouth at bedtime.        No current facility-administered medications for this visit.     Past Surgical History  Procedure Laterality Date  . Pacemaker insertion  03/14/13    SJM Endurity implanted by Dr Truman Hayward in [place] for complete heart block  . Other surgical history  Intestinal blockage x2  . Other surgical history  1964    Football injury     No Known Allergies    Family History  Problem Relation Age of Onset  . Cancer Father      Social History Mr. Kaminsky reports that he quit smoking about 2 years ago. His smoking use included Cigarettes. He smoked 0.00 packs per day for 40 years. He does not have any smokeless tobacco history on file. Mr. Atiyeh reports that he does not drink alcohol.   Review of Systems CONSTITUTIONAL: No weight loss, fever, chills, weakness or fatigue.  HEENT: Eyes: No visual loss, blurred vision, double vision or yellow sclerae.No hearing loss, sneezing, congestion, runny nose or sore throat.  SKIN: No rash or itching.  CARDIOVASCULAR: per HPI RESPIRATORY: No  shortness of breath, cough or sputum.  GASTROINTESTINAL: No anorexia, nausea, vomiting or diarrhea. No abdominal pain or blood.  GENITOURINARY: No burning on urination, no polyuria NEUROLOGICAL: No headache, dizziness, syncope, paralysis, ataxia, numbness or tingling in the extremities. No change in bowel or bladder control.  MUSCULOSKELETAL: No muscle, back pain, joint pain or stiffness.  LYMPHATICS: No enlarged nodes. No history of splenectomy.  PSYCHIATRIC: No history of depression or anxiety.  ENDOCRINOLOGIC: No reports of sweating, cold or heat intolerance. No polyuria or polydipsia.  Marland Kitchen   Physical Examination p 99 bp 105/71 Wt 197 lbs BMI 27 Gen: resting comfortably, no acute distress HEENT: no scleral icterus, pupils equal round and reactive, no palptable cervical adenopathy,  CV: RRR, no m/r/g, no JVD, no carotid bruits Resp: Clear to auscultation bilaterally GI: abdomen is soft, non-tender, non-distended, normal bowel sounds, no hepatosplenomegaly MSK: extremities are warm, no edema.  Skin: warm, no rash Neuro:  no focal deficits Psych: appropriate affect   Diagnostic Studies 09/2013 Echo  Study Conclusions  - Left ventricle: The cavity size was normal. Wall thickness was normal. Systolic function was mildly reduced. The estimated ejection fraction was in the range of 45% to 50%. Doppler parameters are consistent with abnormal left ventricular relaxation (grade 1 diastolic dysfunction). - Regional wall motion abnormality: Several of the views are foreshortened, however overall the apex does appear hypokinetic. Hypokinesis of the apical anterior, apical inferior, apical septal, apical lateral, and apical myocardium. Can consider echo with contrast for more definitive evaluation of wall motion. - Aortic valve: Mildly calcified annulus. Trileaflet; mildly thickened leaflets. Valve area (VTI): 3.76 cm^2. Valve area (Vmax): 3.65 cm^2. - Mitral valve: Mildly calcified  annulus. Mildly thickened leaflets . 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 - Systemic veins: The IVC is small, suggesting low RA pressures and hypovolemia.  10/2012 Cath [facility]  RHC: RA 5, PA 40/12 (mean not reported) PCWP 12, PA sat 75%.  LHC: LM patent, LAD mid 20%, LCX patent, RCA anomolous origine from left cusp patent. LVEF 70%.        Assessment and Plan  1. Low normal to mildly decreased LV systolic function  - echo 09/2013 LVEF 45-50%, prior cath 2014 with patent coronaries  - fairly mild symptoms, typically with higher levels of exertion  - generalized fatigue and nausea while on coreg prevously, unclear if related to medication as he was later diagnosed with urinary obstruction and AKI. Off coreg since that time. Will not restart at this time, repeat echo in 6 months. If progression of systolic dysfunction consider reintroducing at that time.   2. Non-obstructive CAD  - cath with mild LAD lesion, denies any symptoms  -  aggressive risk factor modification. Fairly normal lipid recently.  3. CKD  - per pcp   4. HTN  - at goal, continue to follow  5. Urinary obstruction - being considered for uro procedure, from cardiac standpoint there is no contraindication. Recommend proceeding as planned.   6. Complete heart block - pacemaker with normal function on last check, no current symptoms - continue to follow       Arnoldo Lenis, M.D.

## 2014-02-03 NOTE — Patient Instructions (Signed)
There were no changes to your medications. Continue as directed. Your physician wants you to follow up in: 6 months.  You will receive a reminder letter in the mail one-two months in advance.  If you don't receive a letter, please call our office to schedule the follow up appointment.

## 2014-02-05 ENCOUNTER — Encounter (HOSPITAL_COMMUNITY): Payer: Self-pay | Admitting: Pharmacy Technician

## 2014-02-06 ENCOUNTER — Encounter (HOSPITAL_COMMUNITY): Payer: Self-pay

## 2014-02-10 ENCOUNTER — Encounter (HOSPITAL_COMMUNITY)
Admission: RE | Admit: 2014-02-10 | Discharge: 2014-02-10 | Disposition: A | Discharge status: Home / Self Care | Payer: Medicare Other | Source: Ambulatory Visit | Attending: Urology | Admitting: Urology

## 2014-02-10 ENCOUNTER — Encounter (HOSPITAL_COMMUNITY): Payer: Self-pay

## 2014-02-10 DIAGNOSIS — R338 Other retention of urine: Secondary | ICD-10-CM | POA: Diagnosis not present

## 2014-02-10 DIAGNOSIS — N401 Enlarged prostate with lower urinary tract symptoms: Secondary | ICD-10-CM | POA: Diagnosis present

## 2014-02-10 DIAGNOSIS — N3289 Other specified disorders of bladder: Secondary | ICD-10-CM | POA: Diagnosis not present

## 2014-02-10 DIAGNOSIS — Z87891 Personal history of nicotine dependence: Secondary | ICD-10-CM | POA: Diagnosis not present

## 2014-02-10 DIAGNOSIS — N1330 Unspecified hydronephrosis: Secondary | ICD-10-CM | POA: Diagnosis not present

## 2014-02-10 DIAGNOSIS — Z950 Presence of cardiac pacemaker: Secondary | ICD-10-CM | POA: Diagnosis not present

## 2014-02-10 DIAGNOSIS — N189 Chronic kidney disease, unspecified: Secondary | ICD-10-CM | POA: Diagnosis not present

## 2014-02-10 DIAGNOSIS — I129 Hypertensive chronic kidney disease with stage 1 through stage 4 chronic kidney disease, or unspecified chronic kidney disease: Secondary | ICD-10-CM | POA: Diagnosis not present

## 2014-02-10 DIAGNOSIS — K219 Gastro-esophageal reflux disease without esophagitis: Secondary | ICD-10-CM | POA: Diagnosis not present

## 2014-02-10 HISTORY — DX: Pain, unspecified: R52

## 2014-02-10 HISTORY — DX: Unspecified osteoarthritis, unspecified site: M19.90

## 2014-02-10 HISTORY — DX: Shortness of breath: R06.02

## 2014-02-10 HISTORY — DX: Presence of cardiac pacemaker: Z95.0

## 2014-02-10 HISTORY — DX: Unspecified hemorrhoids: K64.9

## 2014-02-10 HISTORY — DX: Atherosclerotic heart disease of native coronary artery without angina pectoris: I25.10

## 2014-02-10 LAB — BASIC METABOLIC PANEL
ANION GAP: 13 (ref 5–15)
BUN: 21 mg/dL (ref 6–23)
CALCIUM: 9.4 mg/dL (ref 8.4–10.5)
CO2: 21 mEq/L (ref 19–32)
Chloride: 105 mEq/L (ref 96–112)
Creatinine, Ser: 2.22 mg/dL — ABNORMAL HIGH (ref 0.50–1.35)
GFR calc non Af Amer: 29 mL/min — ABNORMAL LOW (ref >90)
GFR, EST AFRICAN AMERICAN: 34 mL/min — AB (ref >90)
Glucose, Bld: 157 mg/dL — ABNORMAL HIGH (ref 70–99)
POTASSIUM: 4.8 meq/L (ref 3.7–5.3)
SODIUM: 139 meq/L (ref 137–147)

## 2014-02-10 LAB — CBC
HCT: 37.4 % — ABNORMAL LOW (ref 39.0–52.0)
Hemoglobin: 11.8 g/dL — ABNORMAL LOW (ref 13.0–17.0)
MCH: 27.3 pg (ref 26.0–34.0)
MCHC: 31.6 g/dL (ref 30.0–36.0)
MCV: 86.6 fL (ref 78.0–100.0)
PLATELETS: 378 10*3/uL (ref 150–400)
RBC: 4.32 MIL/uL (ref 4.22–5.81)
RDW: 14 % (ref 11.5–15.5)
WBC: 9.4 10*3/uL (ref 4.0–10.5)

## 2014-02-10 NOTE — Pre-Procedure Instructions (Signed)
PT'S BMET REPORT FAXED IN EPIC TO DR. Lyndal Rainbow OFFICE  - CREATINE 2.22

## 2014-02-10 NOTE — Patient Instructions (Addendum)
YOUR SURGERY IS SCHEDULED AT [facility]  ON:   Friday  10/16  REPORT TO  SHORT STAY CENTER AT:  7:00 AM    DO NOT EAT OR DRINK ANYTHING AFTER MIDNIGHT THE NIGHT BEFORE YOUR SURGERY.  YOU MAY BRUSH YOUR TEETH, RINSE OUT YOUR MOUTH--BUT NO WATER, NO FOOD, NO CHEWING GUM, NO MINTS, NO CANDIES, NO CHEWING TOBACCO.  PLEASE TAKE THE FOLLOWING MEDICATIONS THE AM OF YOUR SURGERY WITH A FEW SIPS OF WATER:  HYDROCODONE / ACETAMINOPHEN IF NEEDED FOR PAIN   DO NOT BRING VALUABLES, MONEY, CREDIT CARDS.  DO NOT WEAR JEWELRY, MAKE-UP, NAIL POLISH AND NO METAL PINS OR CLIPS IN YOUR HAIR. CONTACT LENS, DENTURES / PARTIALS, GLASSES SHOULD NOT BE WORN TO SURGERY AND IN MOST CASES-HEARING AIDS WILL NEED TO BE REMOVED.  BRING YOUR GLASSES CASE, ANY EQUIPMENT NEEDED FOR YOUR CONTACT LENS. FOR PATIENTS ADMITTED TO THE HOSPITAL--CHECK OUT TIME THE DAY OF DISCHARGE IS 11:00 AM.  ALL INPATIENT ROOMS ARE PRIVATE - WITH BATHROOM, TELEPHONE, TELEVISION AND WIFI INTERNET.  IF YOU ARE BEING DISCHARGED THE SAME DAY OF YOUR SURGERY--YOU CAN NOT DRIVE YOURSELF HOME--AND SHOULD NOT GO HOME ALONE BY TAXI OR BUS.  NO DRIVING OR OPERATING MACHINERY, OR MAKING LEGAL DECISIONS FOR 24 HOURS FOLLOWING ANESTHESIA / PAIN MEDICATIONS.  PLEASE MAKE ARRANGEMENTS FOR SOMEONE TO BE WITH YOU AT HOME THE FIRST 24 HOURS AFTER SURGERY. RESPONSIBLE DRIVER'S NAME / PHONE  PT'S WIFE WILL BE WITH HIM                                                   PLEASE BE AWARE THAT YOU MAY NEED ADDITIONAL BLOOD DRAWN DAY OF YOUR SURGERY  _______________________________________________________________________  [facility] - Preparing for Surgery Before surgery, you can play an important role.  Because skin is not sterile, your skin needs to be as free of germs as possible.  You can reduce the number of germs on your skin by washing with CHG (chlorahexidine gluconate) soap before surgery.  CHG is an antiseptic cleaner which kills germs and  bonds with the skin to continue killing germs even after washing. Please DO NOT use if you have an allergy to CHG or antibacterial soaps.  If your skin becomes reddened/irritated stop using the CHG and inform your nurse when you arrive at Short Stay. Do not shave (including legs and underarms) for at least 48 hours prior to the first CHG shower.  You may shave your face/neck. Please follow these instructions carefully:  1.  Shower with CHG Soap the night before surgery and the  morning of Surgery.  2.  If you choose to wash your hair, wash your hair first as usual with your  normal  shampoo.  3.  After you shampoo, rinse your hair and body thoroughly to remove the  shampoo.                           4.  Use CHG as you would any other liquid soap.  You can apply chg directly  to the skin and wash                       Gently with a scrungie or clean washcloth.  5.  Apply the CHG Soap to  your body ONLY FROM THE NECK DOWN.   Do not use on face/ open                           Wound or open sores. Avoid contact with eyes, ears mouth and genitals (private parts).                       Wash face,  Genitals (private parts) with your normal soap.             6.  Wash thoroughly, paying special attention to the area where your surgery  will be performed.  7.  Thoroughly rinse your body with warm water from the neck down.  8.  DO NOT shower/wash with your normal soap after using and rinsing off  the CHG Soap.                9.  Pat yourself dry with a clean towel.            10.  Wear clean pajamas.            11.  Place clean sheets on your bed the night of your first shower and do not  sleep with pets. Day of Surgery : Do not apply any lotions/deodorants the morning of surgery.  Please wear clean clothes to the hospital/surgery center.  FAILURE TO FOLLOW THESE INSTRUCTIONS MAY RESULT IN THE CANCELLATION OF YOUR SURGERY PATIENT SIGNATURE_________________________________  NURSE  SIGNATURE__________________________________  ________________________________________________________________________

## 2014-02-10 NOTE — Pre-Procedure Instructions (Signed)
EKG AND CXR REPORTS ARE IN EPIC FROM 01/16/14 ECHO REPORT IN EPIC FROM 09/2013 OFFICE VISIT CARDIOLOGIST DR. BRANCH IN EPIC FROM 02/03/14 - HAS CARDIAC CLEARANCE FOR SURGERY. COPY MADE OF PT'S PACEMAKER CARD AND ON CHART. PERIOPERATIVE PRESCRIPTION FOR IMPLANTED CARDIAC DEVICE PROGRAMMING ORDERS ON CHART FROM DR. ALLRED - MAGNET MAY BE USED - NO POST OP INTERROGATION NEEDED.

## 2014-02-13 ENCOUNTER — Encounter (HOSPITAL_COMMUNITY): Payer: Self-pay | Admitting: *Deleted

## 2014-02-13 ENCOUNTER — Ambulatory Visit (HOSPITAL_COMMUNITY): Payer: Medicare Other | Admitting: Anesthesiology

## 2014-02-13 ENCOUNTER — Ambulatory Visit (HOSPITAL_COMMUNITY)
Admission: RE | Admit: 2014-02-13 | Discharge: 2014-02-13 | Disposition: A | Discharge status: Home / Self Care | Payer: Medicare Other | Source: Ambulatory Visit | Attending: Urology | Admitting: Urology

## 2014-02-13 ENCOUNTER — Encounter (HOSPITAL_COMMUNITY): Payer: Medicare Other | Admitting: Anesthesiology

## 2014-02-13 ENCOUNTER — Encounter (HOSPITAL_COMMUNITY)
Admission: RE | Disposition: A | Discharge status: Home / Self Care | Payer: Self-pay | Source: Ambulatory Visit | Attending: Urology

## 2014-02-13 DIAGNOSIS — I129 Hypertensive chronic kidney disease with stage 1 through stage 4 chronic kidney disease, or unspecified chronic kidney disease: Secondary | ICD-10-CM | POA: Insufficient documentation

## 2014-02-13 DIAGNOSIS — N3289 Other specified disorders of bladder: Secondary | ICD-10-CM | POA: Diagnosis not present

## 2014-02-13 DIAGNOSIS — R338 Other retention of urine: Secondary | ICD-10-CM | POA: Diagnosis not present

## 2014-02-13 DIAGNOSIS — Z87891 Personal history of nicotine dependence: Secondary | ICD-10-CM | POA: Insufficient documentation

## 2014-02-13 DIAGNOSIS — N189 Chronic kidney disease, unspecified: Secondary | ICD-10-CM | POA: Insufficient documentation

## 2014-02-13 DIAGNOSIS — N1330 Unspecified hydronephrosis: Secondary | ICD-10-CM | POA: Diagnosis not present

## 2014-02-13 DIAGNOSIS — N401 Enlarged prostate with lower urinary tract symptoms: Secondary | ICD-10-CM | POA: Diagnosis not present

## 2014-02-13 DIAGNOSIS — K219 Gastro-esophageal reflux disease without esophagitis: Secondary | ICD-10-CM | POA: Insufficient documentation

## 2014-02-13 DIAGNOSIS — Z950 Presence of cardiac pacemaker: Secondary | ICD-10-CM | POA: Insufficient documentation

## 2014-02-13 HISTORY — PX: GREEN LIGHT LASER TURP (TRANSURETHRAL RESECTION OF PROSTATE: SHX6260

## 2014-02-13 SURGERY — GREEN LIGHT LASER TURP (TRANSURETHRAL RESECTION OF PROSTATE
Anesthesia: General | Site: Prostate

## 2014-02-13 MED ORDER — PROPOFOL 10 MG/ML IV BOLUS
INTRAVENOUS | Status: DC | PRN
  Administered 2014-02-13: 120 mg via INTRAVENOUS
  Administered 2014-02-13: 30 mg via INTRAVENOUS

## 2014-02-13 MED ORDER — FENTANYL CITRATE 0.05 MG/ML IJ SOLN
INTRAMUSCULAR | Status: DC | PRN
  Administered 2014-02-13: 50 ug via INTRAVENOUS
  Administered 2014-02-13 (×12): 25 ug via INTRAVENOUS

## 2014-02-13 MED ORDER — PROPOFOL 10 MG/ML IV BOLUS
INTRAVENOUS | Status: AC
  Filled 2014-02-13: qty 20

## 2014-02-13 MED ORDER — SODIUM CHLORIDE 0.9 % IV SOLN
INTRAVENOUS | Status: DC
  Administered 2014-02-13: 1000 mL via INTRAVENOUS

## 2014-02-13 MED ORDER — MEPERIDINE HCL 50 MG/ML IJ SOLN: 6.2500 mg | INTRAMUSCULAR | Status: DC | PRN

## 2014-02-13 MED ORDER — FENTANYL CITRATE 0.05 MG/ML IJ SOLN
INTRAMUSCULAR | Status: AC
  Filled 2014-02-13: qty 5

## 2014-02-13 MED ORDER — ONDANSETRON HCL 4 MG/2ML IJ SOLN
INTRAMUSCULAR | Status: DC | PRN
  Administered 2014-02-13: 4 mg via INTRAVENOUS

## 2014-02-13 MED ORDER — CEFAZOLIN SODIUM-DEXTROSE 2-3 GM-% IV SOLR
INTRAVENOUS | Status: AC
  Filled 2014-02-13: qty 50

## 2014-02-13 MED ORDER — FENTANYL CITRATE 0.05 MG/ML IJ SOLN
INTRAMUSCULAR | Status: AC
  Filled 2014-02-13: qty 2

## 2014-02-13 MED ORDER — DEXAMETHASONE SODIUM PHOSPHATE 10 MG/ML IJ SOLN
INTRAMUSCULAR | Status: AC
  Filled 2014-02-13: qty 1

## 2014-02-13 MED ORDER — MIDAZOLAM HCL 5 MG/5ML IJ SOLN
INTRAMUSCULAR | Status: DC | PRN
  Administered 2014-02-13 (×2): 1 mg via INTRAVENOUS

## 2014-02-13 MED ORDER — BELLADONNA ALKALOIDS-OPIUM 16.2-60 MG RE SUPP
RECTAL | Status: DC | PRN
  Administered 2014-02-13: 1 via RECTAL

## 2014-02-13 MED ORDER — FENTANYL CITRATE 0.05 MG/ML IJ SOLN: 25.0000 ug | INTRAMUSCULAR | Status: DC | PRN

## 2014-02-13 MED ORDER — BELLADONNA ALKALOIDS-OPIUM 16.2-60 MG RE SUPP
RECTAL | Status: AC
  Filled 2014-02-13: qty 1

## 2014-02-13 MED ORDER — CEFAZOLIN SODIUM-DEXTROSE 2-3 GM-% IV SOLR
2.0000 g | INTRAVENOUS | Status: AC
  Administered 2014-02-13: 2 g via INTRAVENOUS

## 2014-02-13 MED ORDER — ONDANSETRON HCL 4 MG/2ML IJ SOLN
INTRAMUSCULAR | Status: AC
  Filled 2014-02-13: qty 2

## 2014-02-13 MED ORDER — PROMETHAZINE HCL 25 MG/ML IJ SOLN: 6.2500 mg | INTRAMUSCULAR | Status: DC | PRN

## 2014-02-13 MED ORDER — DEXAMETHASONE SODIUM PHOSPHATE 10 MG/ML IJ SOLN
INTRAMUSCULAR | Status: DC | PRN
  Administered 2014-02-13: 10 mg via INTRAVENOUS

## 2014-02-13 MED ORDER — SODIUM CHLORIDE 0.9 % IR SOLN
Status: DC | PRN
  Administered 2014-02-13: 16000 mL via INTRAVESICAL

## 2014-02-13 MED ORDER — MIDAZOLAM HCL 2 MG/2ML IJ SOLN
INTRAMUSCULAR | Status: AC
  Filled 2014-02-13: qty 2

## 2014-02-13 SURGICAL SUPPLY — 21 items
BAG URINE DRAINAGE (UROLOGICAL SUPPLIES) ×3 IMPLANT
BAG URO CATCHER STRL LF (DRAPE) ×3 IMPLANT
CATH FOLEY 3WAY 30CC 20FR (CATHETERS) ×3 IMPLANT
CATH TIEMANN FOLEY 18FR 5CC (CATHETERS) IMPLANT
DRAPE CAMERA CLOSED 9X96 (DRAPES) ×3 IMPLANT
ELECT BUTTON HF 24-28F 2 30DE (ELECTRODE) IMPLANT
ELECT LOOP MED HF 24F 12D (CUTTING LOOP) IMPLANT
ELECT LOOP MED HF 24F 12D CBL (CLIP) IMPLANT
ELECT RESECT VAPORIZE 12D CBL (ELECTRODE) IMPLANT
GLOVE BIOGEL M STRL SZ7.5 (GLOVE) ×3 IMPLANT
GOWN STRL REUS W/TWL XL LVL3 (GOWN DISPOSABLE) ×3 IMPLANT
HOLDER FOLEY CATH W/STRAP (MISCELLANEOUS) ×3 IMPLANT
LASER FIBER /GREENLIGHT LASER (Laser) ×3 IMPLANT
LASER GREENLIGHT RENTAL P/PROC (Laser) ×3 IMPLANT
MANIFOLD NEPTUNE II (INSTRUMENTS) ×3 IMPLANT
PACK CYSTO (CUSTOM PROCEDURE TRAY) ×3 IMPLANT
PLUG CATH AND CAP STER (CATHETERS) ×3 IMPLANT
SYR 30ML LL (SYRINGE) IMPLANT
SYRINGE IRR TOOMEY STRL 70CC (SYRINGE) IMPLANT
TUBING CONNECTING 10 (TUBING) ×2 IMPLANT
TUBING CONNECTING 10' (TUBING) ×1

## 2014-02-13 NOTE — Anesthesia Postprocedure Evaluation (Signed)
  Anesthesia Post-op Note  Patient: Troy Petty  Procedure(s) Performed: Procedure(s) (LRB): GREEN LIGHT LASER TURP (TRANSURETHRAL RESECTION OF PROSTATE (N/A)  Patient Location: PACU  Anesthesia Type: General  Level of Consciousness: awake and alert   Airway and Oxygen Therapy: Patient Spontanous Breathing  Post-op Pain: mild  Post-op Assessment: Post-op Vital signs reviewed, Patient's Cardiovascular Status Stable, Respiratory Function Stable, Patent Airway and No signs of Nausea or vomiting  Last Vitals:  Filed Vitals:   02/13/14 1310  BP: 132/63  Pulse: 75  Temp: 36.6 C  Resp: 16    Post-op Vital Signs: stable   Complications: No apparent anesthesia complications

## 2014-02-13 NOTE — Discharge Instructions (Signed)
Foley Catheter Care °A Foley catheter is a soft, flexible tube that is placed into the bladder to drain urine. A Foley catheter may be inserted if: °· You leak urine or are not able to control when you urinate (urinary incontinence). °· You are not able to urinate when you need to (urinary retention). °· You had prostate surgery or surgery on the genitals. °· You have certain medical conditions, such as multiple sclerosis, dementia, or a spinal cord injury. °If you are going home with a Foley catheter in place, follow the instructions below. °TAKING CARE OF THE CATHETER °1. Wash your hands with soap and water. °2. Using mild soap and warm water on a clean washcloth: °· Clean the area on your body closest to the catheter insertion site using a circular motion, moving away from the catheter. Never wipe toward the catheter because this could sweep bacteria up into the urethra and cause infection. °· Remove all traces of soap. Pat the area dry with a clean towel. For males, reposition the foreskin. °3. Attach the catheter to your leg so there is no tension on the catheter. Use adhesive tape or a leg strap. If you are using adhesive tape, remove any sticky residue left behind by the previous tape you used. °4. Keep the drainage bag below the level of the bladder, but keep it off the floor. °5. Check throughout the day to be sure the catheter is working and urine is draining freely. Make sure the tubing does not become kinked. °6. Do not pull on the catheter or try to remove it. Pulling could damage internal tissues. °TAKING CARE OF THE DRAINAGE BAGS °You will be given two drainage bags to take home. One is a large overnight drainage bag, and the other is a smaller leg bag that fits underneath clothing. You may wear the overnight bag at any time, but you should never wear the smaller leg bag at night. Follow the instructions below for how to empty, change, and clean your drainage bags. °Emptying the Drainage Bag °You must  empty your drainage bag when it is  -½ full or at least 2-3 times a day. °1. Wash your hands with soap and water. °2. Keep the drainage bag below your hips, below the level of your bladder. This stops urine from going back into the tubing and into your bladder. °3. Hold the dirty bag over the toilet or a clean container. °4. Open the pour spout at the bottom of the bag and empty the urine into the toilet or container. Do not let the pour spout touch the toilet, container, or any other surface. Doing so can place bacteria on the bag, which can cause an infection. °5. Clean the pour spout with a gauze pad or cotton ball that has rubbing alcohol on it. °6. Close the pour spout. °7. Attach the bag to your leg with adhesive tape or a leg strap. °8. Wash your hands well. °Changing the Drainage Bag °Change your drainage bag once a month or sooner if it starts to smell bad or look dirty. Below are steps to follow when changing the drainage bag. °1. Wash your hands with soap and water. °2. Pinch off the rubber catheter so that urine does not spill out. °3. Disconnect the catheter tube from the drainage tube at the connection valve. Do not let the tubes touch any surface. °4. Clean the end of the catheter tube with an alcohol wipe. Use a different alcohol wipe to clean the   end of the drainage tube. 5. Connect the catheter tube to the drainage tube of the clean drainage bag. 6. Attach the new bag to the leg with adhesive tape or a leg strap. Avoid attaching the new bag too tightly. 7. Wash your hands well. Cleaning the Drainage Bag 1. Wash your hands with soap and water. 2. Wash the bag in warm, soapy water. 3. Rinse the bag thoroughly with warm water. 4. Fill the bag with a solution of white vinegar and water (1 cup vinegar to 1 qt warm water [.2 L vinegar to 1 L warm water]). Close the bag and soak it for 30 minutes in the solution. 5. Rinse the bag with warm water. 6. Hang the bag to dry with the pour spout open  and hanging downward. 7. Store the clean bag (once it is dry) in a clean plastic bag. 8. Wash your hands well. PREVENTING INFECTION  Wash your hands before and after handling your catheter.  Take showers daily and wash the area where the catheter enters your body. Do not take baths. Replace wet leg straps with dry ones, if this applies.  Do not use powders, sprays, or lotions on the genital area. Only use creams, lotions, or ointments as directed by your caregiver.  For females, wipe from front to back after each bowel movement.  Drink enough fluids to keep your urine clear or pale yellow unless you have a fluid restriction.  Do not let the drainage bag or tubing touch or lie on the floor.  Wear cotton underwear to absorb moisture and to keep your skin drier. SEEK MEDICAL CARE IF:   Your urine is cloudy or smells unusually bad.  Your catheter becomes clogged.  You are not draining urine into the bag or your bladder feels full.  Your catheter starts to leak. SEEK IMMEDIATE MEDICAL CARE IF:   You have pain, swelling, redness, or pus where the catheter enters the body.  You have pain in the abdomen, legs, lower back, or bladder.  You have a fever.  You see blood fill the catheter, or your urine is pink or red.  You have nausea, vomiting, or chills.  Your catheter gets pulled out. MAKE SURE YOU:   Understand these instructions.  Will watch your condition.  Will get help right away if you are not doing well or get worse. Document Released: 04/17/2005 Document Revised: 09/01/2013 Document Reviewed: 04/08/2012 [facility] Patient Information 2015 [place], [place]. This information is not intended to replace advice given to you by your health care provider. Make sure you discuss any questions you have with your health care provider. Prostate Laser Surgery, Care After Refer to this sheet in the next few weeks. These instructions provide you with information on caring for yourself  after your procedure. Your health care provider may also give you more specific instructions. Your treatment has been planned according to current medical practices, but problems sometimes occur. Call your health care provider if you have any problems or questions after your procedure. WHAT TO EXPECT AFTER THE PROCEDURE  You may notice blood in your urine that could last up to 3 weeks.  After your catheter is removed, you will have burning (especially at the tip of your penis) when you urinate, especially at the end of urination. For the first few weeks after your procedure, you will feel the need to urinate often. HOME CARE INSTRUCTIONS   Do not perform vigorous exercise, especially heavy lifting, for 1 week or as  directed by your health care provider.  Avoid sexual activity for 4-6 weeks or as directed by your health care provider.  Do not ride in a car for extended periods for 1 month or as directed by your health care provider.  Do not strain to have a bowel movement. Drink a lot of fluids and and make sure you get enough fiber in your diet.  Drink enough fluids to keep your urine clear or pale yellow. SEEK IMMEDIATE MEDICAL CARE IF:   Your catheter has been removed and you are suddenly unable to urinate.  Your catheter has not been removed, and it develops a blockage.  You start to have blood clots in your urine.  The blood in your urine becomes persistent or gets thick.  Your temperature is greater than 100.12F (38.1C).  You develop chest pains.  You develop shortness of breath.  You develop leg swelling or pain. MAKE SURE YOU:  Understand these instructions.  Will watch your condition.  Will get help right away if you are not doing well or get worse. Document Released: 04/17/2005 Document Revised: 04/22/2013 Document Reviewed: 10/07/2012 [facility] Patient Information 2015 [place], [place]. This information is not intended to replace advice given to you by your health  care provider. Make sure you discuss any questions you have with your health care provider.

## 2014-02-13 NOTE — Transfer of Care (Signed)
Immediate Anesthesia Transfer of Care Note  Patient: Troy Petty  Procedure(s) Performed: Procedure(s) (LRB): GREEN LIGHT LASER TURP (TRANSURETHRAL RESECTION OF PROSTATE (N/A)  Patient Location: PACU  Anesthesia Type: General  Level of Consciousness: sedated, patient cooperative and responds to stimulation  Airway & Oxygen Therapy: Patient Spontanous Breathing and Patient connected to face mask oxgen  Post-op Assessment: Report given to PACU RN and Post -op Vital signs reviewed and stable  Post vital signs: Reviewed and stable  Complications: No apparent anesthesia complications

## 2014-02-13 NOTE — Op Note (Signed)
Preoperative diagnosis: BPH, urinary retention Postoperative diagnosis: BPH, urinary retention  Procedure: Exam under anesthesia, Greenlight vaporization of the prostate  Surgeon: Junious Silk  Anesthesia: Carignan   Type of anesthesia: Gen.  Findings: On exam under anesthesia the penis is circumcised without mass or lesion.  The testicles were descended bilaterally and palpably normal.  On digital rectal exam the prostate was enlarged but smooth without hard area or nodule.  On cystoscopy the urethra was normal, the prostate had trilobar hypertrophy with a large median lobe, bladder was trabeculated, trigone and ureteral orifice easily the normal orthotopic position with clear reflux.  The bladder mucosa appeared normal apart from some mild edema along the right posterior and left prostate consistent with the prior Foley indwelling Foley.  There were no  Stones or foreign bodies in the bladder.  Total energy use: 182,884 J, lasing time 34 minutes:47 seconds.,  80,120 W  Description of procedure: After consent was obtained patient brought in the operating room.  After adequate MCG was placed in lithotomy position and prepped and draped in the usual sterile fashion.  A time out was performed to confirm the patient and procedure.  Exam under anesthesia was performed.  I placed a the end as suppository.  The laser scope was passed with the 30 lens.  This provided good visualization of the entire bladder.  It was carefully inspected no concerning lesions were noted.  The laser fiber was deployed and I initially created some space Vaporizing from the bladder neck anterior to posterior down to the area on the right.  And then the left.  This created some space it.  I developed a groove between the lateral and median lobe on the right and the left working my way down to the prostatic capsule.  Periodically checking the ureter orifices to keep them in site.  Once the proper depth was obtained this was  brought down to the area.  I then lasered the median lobe which was significant finally a large piece enucleated itself and washed into the bladder.  I then finished the lateral lobes going again anterior to posterior down to the area.  From that area looking I took this back toward the bladder neck.  This created an open channel.  No significant obstruction seemed to be the median lobe.  There was still quite a bit of apical tissue and he may need a staged procedure.  However looking up from the vera the prostatic urethra did appear patent. At low pressure hemostasis was excellent but there was some oozing from around the apex.  This also made it difficult to finish the apical vaporization that again I felt like the median lobe was likely the primary source of obstruction.  The laser bridge was removed and a normal bridge was placed which allowed placement of a grasping forcep.  The median lobe piece was too large to go through the scope therefore it was grasped and removed by removing the scope.  This median lobe piece and sent to pathology.  Next inspected the prostatic urethra hemostasis was good.  The bladder was carefully inspected and noted to be normal.  The ureteral orifices were normal.  The scope was removed.  A 20 French three-way Foley was placed with a catheter guide.  The balloon was inflated 25 cc and seated at the bladder neck.  Instituted CBI for wakeup which ran clear at a low rate.  I may stop CBI in the recovery room in [facility].  Patient  was awakened taken to recovery room in stable condition.  The area = the veru  Complications: None Blood loss: Minimal  Drains: 20 French Foley, three-way  Specimens: Prostate tissue (median lobe remnant) --  To pathology  Disposition: Patient stable to PACU

## 2014-02-13 NOTE — Anesthesia Preprocedure Evaluation (Signed)
Anesthesia Evaluation  Patient identified by MRN, date of birth, ID band Patient awake    Reviewed: Allergy & Precautions, H&P , NPO status , Patient's Chart, lab work & pertinent test results  Airway Mallampati: II TM Distance: >3 FB Neck ROM: Full    Dental no notable dental hx.    Pulmonary COPDformer smoker,  breath sounds clear to auscultation  Pulmonary exam normal       Cardiovascular hypertension, Pt. on medications + CAD + pacemaker Rhythm:Regular Rate:Normal     Neuro/Psych negative neurological ROS  negative psych ROS   GI/Hepatic Neg liver ROS, GERD-  Medicated and Controlled,  Endo/Other  negative endocrine ROS  Renal/GU Renal InsufficiencyRenal disease  negative genitourinary   Musculoskeletal negative musculoskeletal ROS (+)   Abdominal   Peds negative pediatric ROS (+)  Hematology negative hematology ROS (+)   Anesthesia Other Findings   Reproductive/Obstetrics negative OB ROS                           Anesthesia Physical Anesthesia Plan  ASA: III  Anesthesia Plan: General   Post-op Pain Management:    Induction: Intravenous  Airway Management Planned: LMA  Additional Equipment:   Intra-op Plan:   Post-operative Plan: Extubation in OR  Informed Consent: I have reviewed the patients History and Physical, chart, labs and discussed the procedure including the risks, benefits and alternatives for the proposed anesthesia with the patient or authorized representative who has indicated his/her understanding and acceptance.   Dental advisory given  Plan Discussed with: CRNA  Anesthesia Plan Comments:         Anesthesia Quick Evaluation

## 2014-02-13 NOTE — Progress Notes (Signed)
Patient CBI was clear all along. It was stopped about 1 hr ago and urine remains crystal clear. I d/c'd CBI, capped inflow and left foley to gravity. Pt has no complaints. Abd soft, NT; AFVSS. Pt stable for discharge.

## 2014-02-13 NOTE — H&P (Signed)
History of Present Illness Consult for BPH with urinary retention referred by Dr. Elvina Mattes. Patient was seen earlier this month as a hospital consult.    1-urinary retention-September 2015-patient was seen earlier this month. He was in acute renal failure with a creatinine of 3.59 up from his baseline of 1.8-2. An ultrasound and CT scan showed bilateral hydroureteronephrosis down to a distended bladder. The Foley catheter was placed. He was seen in the emergency department a few days ago complaining of right flank pain. His white count was 17, BUN 18, creatinine 2.2 back at his baseline. Urine culture grew 85,000 Proteus.    2-chronic renal insufficiency, hydronephrosis-September 2015-patient creatinine went up to 3.59. After Foley placement and went back to most recent of 2.2. He underwent repeat renal ultrasound and I reviewed this as compared to his prior CT and renal ultrasound in the hydronephrosis was significantly improved. I suspect it was long-standing and there will be some residual dilation.    3-BPH-September 2015-patient has a long history of BPH on tamsulosin. He was treated earlier this month with urinary retention. Prostate is about 65 g on CT scan. Finasteride was added earlier this month.    4-prostate cancer screening-September 2015-normal DRE    Today, patient is seen for the above. His Foley catheter was changed to the emergency department and the other day. He feels like it is not draining as well and it was not "as far in" as the first one. He's been taking Cipro. He's had no fevers. He continues tamsulosin and finasteride.       Past Medical History Problems  1. History of cardiac disorder (V12.50) 2. History of esophageal reflux (V12.79) 3. History of hypertension (V12.59) 4. History of renal failure (V13.09)  Surgical History Problems  1. History of Intestinal Surgery 2. History of Pacemaker Placement  Current Meds 1. AmLODIPine Besylate 10 MG Oral  Tablet;  Therapy: (Recorded:22Sep2015) to Recorded 2. Aspirin 81 MG Oral Tablet;  Therapy: (Recorded:22Sep2015) to Recorded 3. Ciprofloxacin HCl - 250 MG Oral Tablet;  Therapy: (Recorded:22Sep2015) to Recorded 4. Dexilant 60 MG Oral Capsule Delayed Release;  Therapy: (Recorded:22Sep2015) to Recorded 5. Finasteride 5 MG Oral Tablet;  Therapy: (Recorded:22Sep2015) to Recorded 6. Hydrocodone-Acetaminophen 5-325 MG Oral Tablet;  Therapy: (Recorded:22Sep2015) to Recorded 7. Tamsulosin HCl - 0.4 MG Oral Capsule;  Therapy: (Recorded:22Sep2015) to Recorded  Allergies Medication  1. No Known Drug Allergies  Family History Problems  1. Family history of malignant neoplasm of prostate (P32.95) : Father  Social History Problems    Alcohol use (V49.89)   Denied: History of Caffeine use   Father deceased   Former smoker (V15.82)   Married   Mother deceased   Number of children   Retired  Review of Systems Genitourinary, constitutional, skin, eye, otolaryngeal, hematologic/lymphatic, cardiovascular, pulmonary, endocrine, musculoskeletal, gastrointestinal, neurological and psychiatric system(s) were reviewed and pertinent findings if present are noted.  Gastrointestinal: heartburn.  Respiratory: shortness of breath.    Vitals Vital Signs [Data Includes: Last 1 Day]  Recorded: 22Sep2015 04:10PM  Height: 5 ft 11 in Weight: 194 lb  BMI Calculated: 27.06 BSA Calculated: 2.08 Blood Pressure: 130 / 79 Temperature: 98.2 F Heart Rate: 98  Physical Exam Constitutional: Well nourished and well developed . No acute distress.   ENT:. The ears and nose are normal in appearance.   Neck: The appearance of the neck is normal and no neck mass is present.   Pulmonary: No respiratory distress and normal respiratory rhythm and effort.   Cardiovascular:  Heart rate and rhythm are normal . No peripheral edema.   Abdomen: The abdomen is soft and nontender. No masses are palpated. No CVA  tenderness. No hernias are palpable. No hepatosplenomegaly noted.   Rectal: Rectal exam demonstrates normal sphincter tone, no tenderness and no masses. Estimated prostate size is 1+. The prostate has no nodularity and is not tender. The left seminal vesicle is nonpalpable. The right seminal vesicle is nonpalpable. The perineum is normal on inspection.   Genitourinary: Examination of the penis demonstrates no discharge, no masses, no lesions and a normal meatus. The scrotum is without lesions. The right epididymis is palpably normal and non-tender. The left epididymis is palpably normal and non-tender. The right testis is non-tender and without masses. The left testis is non-tender and without masses.   Lymphatics: The femoral and inguinal nodes are not enlarged or tender.   Skin: Normal skin turgor, no visible rash and no visible skin lesions.   Neuro/Psych:. Mood and affect are appropriate.    Results/Data Urine [Data Includes: Last 1 Day]   22Sep2015  COLOR YELLOW   APPEARANCE CLEAR   SPECIFIC GRAVITY 1.025   pH 5.5   GLUCOSE NEG mg/dL  BILIRUBIN NEG   KETONE NEG mg/dL  BLOOD LARGE   PROTEIN 100 mg/dL  UROBILINOGEN 0.2 mg/dL  NITRITE NEG   LEUKOCYTE ESTERASE MOD   SQUAMOUS EPITHELIAL/HPF RARE   WBC 21-50 WBC/hpf  RBC 21-50 RBC/hpf  BACTERIA MODERATE   CRYSTALS NONE SEEN   CASTS Hyaline casts noted   Other MUCUS NOTED    Old records or history reviewed: scanned, epic.  The following images/tracing/specimen were independently visualized:  U/S, CT, U/S.  PVR: Ultrasound PVR 39 ml.    Assessment Assessed  1. BPH (benign prostatic hypertrophy) with urinary obstruction (600.01,599.69) 2. Urinary retention (788.20) 3. Chronic renal failure (585.9)  I did a thorough exam and a digital rectal exam-the catheter seems to be in a good position. I did not palpate the balloon. pvr 39 ml .   Plan BPH (benign prostatic hypertrophy) with urinary obstruction  1. PSA; Status:Resulted  - Requires Verification;   Done: [id number] 05:20PM 2. URINE CULTURE; Status:In Progress - Specimen/Data Collected;   Done: 22Sep2015 3. VENIPUNCTURE; Status:Complete;   Done: [id number] Health Maintenance  4. UA With REFLEX; [Do Not Release]; Status:Resulted - Requires Verification;   Done:  22Sep2015 03:05PM Urinary retention  5. Follow-up Schedule Surgery Office  Follow-up  Status: Complete  Done: [id number] 3. PVR U/S; Status:Complete;   Done: 22Sep2015  Discussion/Summary BPH associated urinary retention, hydronephrosis and renal failure-we discussed the nature risk and benefits of continuing combination therapy or proceeding with an outlet procedure such as TURP or greenlight photo vaporization. We discussed recent [place]. We discussed risks of greenlight photo vaporization such as bleeding, infection, persistent retention, incontinence, strictures, fluid overload, thrombotic events, anesthetic complications, worsening/de novo ur and gency, and sexual dysfunction (impotence/retrograde ejaculation) among others. We discussed treatment goals may not be reached and/or may require multiple procedures. We discussed the likelihood of achieving the goals of the procedure and potential problems that might occur during the procedure or recuperation. Another advantage of surgery as he will be able to get all medicines in the long run. All questions answered. We'll proceed with greenlight photo vaporization of the prostate. He is on Cipro now. I did send urine for culture as precaution.     Urinary retention - discussed continuing Foley catheter, CIC, outlet procedure as above, or waiting for combination therapy  to reach full effect.    Chronic renal insufficiency - kidney function back to baseline with bladder drainage.     Signatures Electronically signed by : Festus Aloe, M.D.; Jan 21 2014 10:12AM EST

## 2014-02-13 NOTE — Interval H&P Note (Signed)
History and Physical Interval Note:  02/13/2014 8:50 AM  Troy Petty  has presented today for surgery, with the diagnosis of BENIGN PROSTATIC HYPERPLASIA (BPH)/URINARY RETENTION  The various methods of treatment have been discussed with the patient and family. After consideration of risks, benefits and other options for treatment, the patient has consented to  Procedure(s): GREEN LIGHT LASER TURP (TRANSURETHRAL RESECTION OF PROSTATE (N/A) as a surgical intervention .  The patient's history has been reviewed, patient examined, no change in status, stable for surgery.  I have reviewed the patient's chart and labs.  I discussed with the patient the nature, potential benefits, risks and alternatives to Greenlight PVP, including side effects of the proposed treatment, the likelihood of the patient achieving the goals of the procedure, and any potential problems that might occur during the procedure or recuperation. We also discussed possibility of and nature, r/b of cysto, bbx, TURBT.  All questions answered. Patient elects to proceed. He took a course of Cipro followed by cephalexin for last two days and urine very clear this AM. His CBC and BMP are improved compared to a few weeks ago.     Festus Aloe

## 2014-02-16 ENCOUNTER — Encounter (HOSPITAL_COMMUNITY): Payer: Self-pay | Admitting: Urology

## 2014-04-14 ENCOUNTER — Ambulatory Visit (INDEPENDENT_AMBULATORY_CARE_PROVIDER_SITE_OTHER): Payer: Medicare Other | Admitting: Cardiology

## 2014-04-14 ENCOUNTER — Encounter: Payer: Self-pay | Admitting: Cardiology

## 2014-04-14 VITALS — BP 125/78 | HR 80 | Ht 71.0 in | Wt 199.0 lb

## 2014-04-14 DIAGNOSIS — I10 Essential (primary) hypertension: Secondary | ICD-10-CM

## 2014-04-14 DIAGNOSIS — I442 Atrioventricular block, complete: Secondary | ICD-10-CM

## 2014-04-14 DIAGNOSIS — I2510 Atherosclerotic heart disease of native coronary artery without angina pectoris: Secondary | ICD-10-CM

## 2014-04-14 NOTE — Patient Instructions (Signed)
   Decrease Amlodipine to 5mg  daily - may take 1/2 of the 10mg  tablet  Continue all other medications.   Your physician has requested that you regularly monitor and record your blood pressure readings at home. Please take readings approximately 2 hours after medication 2-3 x week for 2 weeks.  Return to office for MD review.  Your physician wants you to follow up in: 6 months.  You will receive a reminder letter in the mail one-two months in advance.  If you don't receive a letter, please call our office to schedule the follow up appointment

## 2014-04-14 NOTE — Progress Notes (Signed)
Clinical Summary Troy Petty is a 63 y.o.male seen today for follow up of the following medical problems.  1. Low normal to mildly decreased LV systolic function  - echo 09/2013 LVEF 46-50%, grade I diastolic dysfunction. Apex appears to be hypokinetic though poor visualization  - prior cath in [facility] 10/2012 with only mid LAD 20% lesion, otherwise patent vessels.  - had tried starting home on coreg, however had generalized nausea and fatigue. This was in the setting of later diagnosed urinary obstruction and AKI, unclear if coreg was related to symptoms, however the medication was stopped at that time and not restarted.   - denies any SOB, no DOE since last visit. Denies any chest pain.    2. Non-obstructive CAD  - mild LAD lesion from cath 2014  - denies any chest pain   3. Complete heart block  - [employer] Jude pacemaker followed by Dr Rayann Heman  - normal function by last check 12/2013. Denies any lightheadeness or dizzines.   4. CKD  - followed by pcp  - baseline Cr appears to be around 2. Primary issue was urinary obstruction, he is s/p urological procedure and no longer requiring a foley catheter.   5. HTN  - checking at home twice a day. In AM typically 110-130s /60s - compliant with meds   Past Medical History  Diagnosis Date  . Chronic renal insufficiency     baseline creatinine is 1.8  . GERD (gastroesophageal reflux disease)   . Chronic systolic dysfunction of left ventricle 4/15    EF 40-45% in setting of PMT on limited echo  . Hypertrophy of prostate with urinary obstruction and other lower urinary tract symptoms (LUTS)     INDWELLING FOLEY CATHETER  . Chronic kidney disease, stage III (moderate)   . Vitamin B12 deficiency   . Hypertension   . Mild pulmonary hypertension   . Metabolic syndrome   . COPD (chronic obstructive pulmonary disease)   . Complete heart block   . Shortness of breath     WITH EXERTION  . Arthritis     MIDDLE FINGER AND  RING FINGER BOTH HANDS  . Pain     BACK PAIN AT TIMES  . Hemorrhoids   . Pacemaker     IMPLANTED Mar 14, 2013 - DR. ALLRED WITH [facility] FOLLOWS DEVICE FUNCTIONING  . Coronary artery disease     NON OBSTRUCTIVE CAD; DR. Harl Bowie WITH [facility] IS PT'S CARDIOLOGIST     No Known Allergies   Current Outpatient Prescriptions  Medication Sig Dispense Refill  . amLODipine (NORVASC) 10 MG tablet Take 10 mg by mouth every morning.    Marland Kitchen aspirin EC 81 MG tablet Take 81 mg by mouth every morning.    . cephALEXin (KEFLEX) 500 MG capsule Take 500 mg by mouth 2 (two) times daily.    Marland Kitchen dexlansoprazole (DEXILANT) 60 MG capsule Take 60 mg by mouth daily as needed (acid reflux--WAS ON TRIAL BASIS - DOESN'T HAVE ANY DEXILANT LEFT).     Marland Kitchen HYDROcodone-acetaminophen (NORCO/VICODIN) 5-325 MG per tablet Take 1 tablet by mouth every 6 (six) hours as needed for moderate pain.    . Hyprom-Naphaz-Polysorb-Zn Sulf (CLEAR EYES COMPLETE OP) Apply 1-2 drops to eye once as needed (dry/ red eyes.).    Marland Kitchen oxybutynin (DITROPAN) 5 MG tablet Take 5 mg by mouth 3 (three) times daily as needed for bladder spasms.    . tamsulosin (FLOMAX) 0.4 MG CAPS capsule Take  0.4 mg by mouth at bedtime.      No current facility-administered medications for this visit.     Past Surgical History  Procedure Laterality Date  . Pacemaker insertion  03/14/13    SJM Endurity implanted by Dr Truman Hayward in [place] for complete heart block  . Other surgical history      Intestinal blockage x2  . Other surgical history  1964    Football injury  . Colon surgery      COLON RESECTION FOR BLOCKAGE  . Cardiac catheterization  2014  . Green light laser turp (transurethral resection of prostate N/A 02/13/2014    Procedure: GREEN LIGHT LASER TURP (TRANSURETHRAL RESECTION OF PROSTATE;  Surgeon: Festus Aloe, MD;  Location: [facility];  Service: Urology;  Laterality: N/A;     No Known Allergies    Family History  Problem Relation  Age of Onset  . Cancer Father      Social History Mr. Maalouf reports that he quit smoking about 2 years ago. His smoking use included Cigarettes. He started smoking about 47 years ago. He has a 45 pack-year smoking history. He has never used smokeless tobacco. Mr. Caudill reports that he does not drink alcohol.   Review of Systems CONSTITUTIONAL: No weight loss, fever, chills, weakness or fatigue.  HEENT: Eyes: No visual loss, blurred vision, double vision or yellow sclerae.No hearing loss, sneezing, congestion, runny nose or sore throat.  SKIN: No rash or itching.  CARDIOVASCULAR: per HPI RESPIRATORY: No shortness of breath, cough or sputum.  GASTROINTESTINAL: No anorexia, nausea, vomiting or diarrhea. No abdominal pain or blood.  GENITOURINARY: No burning on urination, no polyuria NEUROLOGICAL: No headache, dizziness, syncope, paralysis, ataxia, numbness or tingling in the extremities. No change in bowel or bladder control.  MUSCULOSKELETAL: No muscle, back pain, joint pain or stiffness.  LYMPHATICS: No enlarged nodes. No history of splenectomy.  PSYCHIATRIC: No history of depression or anxiety.  ENDOCRINOLOGIC: No reports of sweating, cold or heat intolerance. No polyuria or polydipsia.  Marland Kitchen   Physical Examination p 80 bp 125/78 Wt 199 lbs BMI 28 Gen: resting comfortably, no acute distress HEENT: no scleral icterus, pupils equal round and reactive, no palptable cervical adenopathy,  CV: RRR, no m/r/g, no JVD, no carotid bruits Resp: Clear to auscultation bilaterally GI: abdomen is soft, non-tender, non-distended, normal bowel sounds, no hepatosplenomegaly MSK: extremities are warm, no edema.  Skin: warm, no rash Neuro:  no focal deficits Psych: appropriate affect   Diagnostic Studies 09/2013 Echo  Study Conclusions  - Left ventricle: The cavity size was normal. Wall thickness was normal. Systolic function was mildly reduced. The estimated ejection fraction was in the  range of 45% to 50%. Doppler parameters are consistent with abnormal left ventricular relaxation (grade 1 diastolic dysfunction). - Regional wall motion abnormality: Several of the views are foreshortened, however overall the apex does appear hypokinetic. Hypokinesis of the apical anterior, apical inferior, apical septal, apical lateral, and apical myocardium. Can consider echo with contrast for more definitive evaluation of wall motion. - Aortic valve: Mildly calcified annulus. Trileaflet; mildly thickened leaflets. Valve area (VTI): 3.76 cm^2. Valve area (Vmax): 3.65 cm^2. - Mitral valve: Mildly calcified annulus. Mildly thickened leaflets . 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 - Systemic veins: The IVC is small, suggesting low RA pressures and hypovolemia.  10/2012 Cath [facility]  RHC: RA 5, PA 40/12 (mean not reported) PCWP 12, PA sat 75%.  LHC: LM patent, LAD mid 20%, LCX patent,  RCA anomolous origine from left cusp patent. LVEF 70%.     Assessment and Plan  1. Low normal to mildly decreased LV systolic function  - echo 09/2013 LVEF 45-50%, prior cath 2014 with patent coronaries  - denies any current symptoms.  - generalized fatigue and nausea while on coreg prevously, unclear if related to medication as he was later diagnosed with urinary obstruction and AKI. Off coreg since that time. Will not restart at this time, repeat echo in 6 months. If progression of systolic dysfunction consider reintroducing at that time. No ACE-I due to severe CKD.   2. Non-obstructive CAD  - cath with mild LAD lesion, denies any symptoms  - aggressive risk factor modification. Fairly normal lipids recently.  3. CKD  - per pcp   4. HTN  - reports some low bps at home at times, only taking norvasc prn - will decrease norvasc to 5mg  daily, asked to take daily and keep bp log x 2 weeks.   5. Complete heart block - pacemaker with normal function on last  check, no current symptoms - continue to follow   F/u 6 months    Arnoldo Lenis, M.D.

## 2014-04-21 ENCOUNTER — Ambulatory Visit (INDEPENDENT_AMBULATORY_CARE_PROVIDER_SITE_OTHER): Payer: Medicare Other | Admitting: *Deleted

## 2014-04-21 ENCOUNTER — Encounter: Payer: Self-pay | Admitting: Internal Medicine

## 2014-04-21 ENCOUNTER — Telehealth: Payer: Self-pay | Admitting: *Deleted

## 2014-04-21 DIAGNOSIS — I495 Sick sinus syndrome: Secondary | ICD-10-CM

## 2014-04-21 NOTE — Telephone Encounter (Signed)
Wife Parke Simmers) called with concerns over husband BP.  Stated they will be going out of town tomorrow & wanted to make sure he would be okay.  No c/o chest pain, dizziness, or SOB.   See readings below:  12/16 - 11:21 pm - 131/81  77 12/17 -   1:26 am - 142/85  77 12/17 -  11:33 am - 120/76  80 12/17 -  11:31 pm - 127/83  74 12/18 -   1:38 am -  122/77  76 12/18 -  11:50 am - 136/80  74 12/18 -   1:11 am - 134/77  81   12/19 -  10:56 am - 132/81  76  12/20 -  1:56 am - 135/83  74 12/20 - 11:07 am - 119/81  93 12/20 - 11:59 pm -  129/81  75 12/21 -  2:50 am - 140/82  81 - took 1/2 pill am 1/2 pill pm this day.  12/21-   9:43 am - 140/87  79 12/21 - 11:38 pm - 133/87  82 12/21 -  1:41 am - 129/82  70 12/22 -  9:16 am - 140/89  82

## 2014-04-21 NOTE — Telephone Encounter (Signed)
BPs are reasonable, slightly above his goal of <130/80 at times. Last visit last week we changed his norvasc, I would continue to monitor bp for now before making any other changes, give the med change some time to take effect.Please confirm he is taking the norvasc 10mg  daily we talked about last visit  Zandra Abts MD

## 2014-04-21 NOTE — Telephone Encounter (Signed)
Wife notified.  Will continue to keep log of readings & return to office for MD review.

## 2014-04-21 NOTE — Telephone Encounter (Signed)
That is the correct dose, norvasc 5mg  daily. Would continue this for a few weeks and see how pressures do   Zandra Abts MD

## 2014-04-21 NOTE — Telephone Encounter (Signed)
Wife Parke Simmers) notified.  Stated that he is taking 5mg  daily as suggested at last OV.  Doing 1/2 tab of his 10mg  tablet that he already has.  Please clarify dose that patient should be currently taking.

## 2014-04-22 NOTE — Progress Notes (Signed)
Remote pacemaker transmission.   

## 2014-04-25 LAB — MDC_IDC_ENUM_SESS_TYPE_REMOTE
Battery Remaining Longevity: 133 mo
Battery Remaining Percentage: 95.5 %
Battery Voltage: 3.01 V
Brady Statistic AP VP Percent: 1 %
Brady Statistic AP VS Percent: 1 %
Brady Statistic AS VS Percent: 70 %
Brady Statistic RA Percent Paced: 1 %
Date Time Interrogation Session: 20151222162420
Lead Channel Pacing Threshold Amplitude: 0.5 V
Lead Channel Pacing Threshold Pulse Width: 0.4 ms
Lead Channel Sensing Intrinsic Amplitude: 11.9 mV
Lead Channel Sensing Intrinsic Amplitude: 5 mV
Lead Channel Setting Pacing Amplitude: 1 V
Lead Channel Setting Pacing Pulse Width: 0.4 ms
Lead Channel Setting Sensing Sensitivity: 4 mV
MDC IDC MSMT LEADCHNL RA IMPEDANCE VALUE: 350 Ohm
MDC IDC MSMT LEADCHNL RV IMPEDANCE VALUE: 540 Ohm
MDC IDC MSMT LEADCHNL RV PACING THRESHOLD AMPLITUDE: 0.75 V
MDC IDC MSMT LEADCHNL RV PACING THRESHOLD PULSEWIDTH: 0.4 ms
MDC IDC PG SERIAL: 7520753
MDC IDC SET LEADCHNL RA PACING AMPLITUDE: 2.5 V
MDC IDC STAT BRADY AS VP PERCENT: 29 %
MDC IDC STAT BRADY RV PERCENT PACED: 29 %

## 2014-05-08 ENCOUNTER — Encounter: Payer: Self-pay | Admitting: *Deleted

## 2014-05-13 ENCOUNTER — Telehealth: Payer: Self-pay | Admitting: *Deleted

## 2014-05-13 NOTE — Telephone Encounter (Signed)
Received bp recording. On Dr. Nelly Laurence desk for review

## 2014-05-18 ENCOUNTER — Telehealth: Payer: Self-pay | Admitting: *Deleted

## 2014-05-18 NOTE — Telephone Encounter (Signed)
Arnoldo Lenis, MD  Samayah Novinger T Numa Heatwole, CMA           Bp log reviewed, on average they look good. Continue current meds   Zandra Abts MD        Pt made aware.

## 2014-06-23 ENCOUNTER — Other Ambulatory Visit: Payer: Self-pay | Admitting: *Deleted

## 2014-06-23 MED ORDER — AMLODIPINE BESYLATE 10 MG PO TABS: 5.0000 mg | ORAL_TABLET | ORAL | Status: DC

## 2014-07-21 ENCOUNTER — Ambulatory Visit (INDEPENDENT_AMBULATORY_CARE_PROVIDER_SITE_OTHER): Payer: Medicare Other | Admitting: *Deleted

## 2014-07-21 ENCOUNTER — Encounter: Payer: Self-pay | Admitting: Internal Medicine

## 2014-07-21 DIAGNOSIS — I495 Sick sinus syndrome: Secondary | ICD-10-CM

## 2014-07-21 LAB — MDC_IDC_ENUM_SESS_TYPE_REMOTE
Battery Remaining Longevity: 135 mo
Battery Remaining Percentage: 95.5 %
Brady Statistic AS VP Percent: 22 %
Implantable Pulse Generator Serial Number: 7520753
Lead Channel Impedance Value: 390 Ohm
Lead Channel Pacing Threshold Amplitude: 0.5 V
Lead Channel Pacing Threshold Amplitude: 0.75 V
Lead Channel Sensing Intrinsic Amplitude: 12 mV
Lead Channel Setting Pacing Amplitude: 2.5 V
Lead Channel Setting Sensing Sensitivity: 4 mV
MDC IDC MSMT BATTERY VOLTAGE: 3.01 V
MDC IDC MSMT LEADCHNL RA PACING THRESHOLD PULSEWIDTH: 0.4 ms
MDC IDC MSMT LEADCHNL RA SENSING INTR AMPL: 5 mV
MDC IDC MSMT LEADCHNL RV IMPEDANCE VALUE: 560 Ohm
MDC IDC MSMT LEADCHNL RV PACING THRESHOLD PULSEWIDTH: 0.4 ms
MDC IDC SESS DTM: 20160322135115
MDC IDC SET LEADCHNL RV PACING AMPLITUDE: 1 V
MDC IDC SET LEADCHNL RV PACING PULSEWIDTH: 0.4 ms
MDC IDC STAT BRADY AP VP PERCENT: 1 %
MDC IDC STAT BRADY AP VS PERCENT: 1 %
MDC IDC STAT BRADY AS VS PERCENT: 77 %
MDC IDC STAT BRADY RA PERCENT PACED: 1 %
MDC IDC STAT BRADY RV PERCENT PACED: 23 %

## 2014-07-21 NOTE — Progress Notes (Signed)
Remote pacemaker transmission.   

## 2014-08-03 ENCOUNTER — Telehealth: Payer: Self-pay | Admitting: Cardiology

## 2014-08-03 NOTE — Telephone Encounter (Signed)
Pt wife says pt has been working out and wife is concerned about BP going up to 149/86 and HR 70-80s. After pt take BP medication it comes down to 120s/70s with HR remaining WNL. Pt wife would like for him to be seen " to be on the safe side" to discuss BP. Explained what normal limits for HR and BP reading would be. Pt denies any symptoms. Will forward to Dr. Harl Bowie. Pt wife aware Dr. Harl Bowie is not in this office today

## 2014-08-03 NOTE — Telephone Encounter (Signed)
I told her that he doesn't have any availabilities this week but we can see about working him in if the Doctor thinks he needs to be seen. Please advise.

## 2014-08-04 NOTE — Telephone Encounter (Signed)
BP will normally fluctuate some during the day. Emotional stress or physical activity normally increase the blood pressure and heart rate. On average we would like for his blood pressure to be less than 130/80, but there may be sometimes during the day when it is above that and that's ok. Please have him keep a bp log for 1 week and either drop off to our office or call with results. When he checks his bp he should be resting for at least 5 minutes, and it should be at least 1 hours (but can be longer) after taking his meds. If numbers on his bp log are high we can have him see me earlier  Zandra Abts MD

## 2014-08-04 NOTE — Telephone Encounter (Signed)
Pt verbalized understanding of plan, will f/u with pt in 1 week with BP log.

## 2014-08-06 ENCOUNTER — Encounter: Payer: Self-pay | Admitting: Cardiology

## 2014-08-14 ENCOUNTER — Telehealth: Payer: Self-pay | Admitting: *Deleted

## 2014-08-14 MED ORDER — METOPROLOL SUCCINATE ER 25 MG PO TB24: 25.0000 mg | ORAL_TABLET | Freq: Every day | ORAL | Status: DC

## 2014-08-14 NOTE — Telephone Encounter (Signed)
-----   Message from Arnoldo Lenis, MD sent at 08/13/2014  4:22 PM EDT ----- BP log reviewed, bp's are slightly above goal. Would recommend starting Toprol XL 25mg  daily and continuing to follow. Have him update [place] in 2 weeks after starting the medicine.   Zandra Abts MD

## 2014-08-14 NOTE — Telephone Encounter (Signed)
Pt and wife voiced understanding, sent Toprol XL 25 mg daily to [facility] [place]. Will f/u in 2 weeks with BP's.

## 2014-09-22 ENCOUNTER — Telehealth: Payer: Self-pay | Admitting: *Deleted

## 2014-09-22 MED ORDER — AMLODIPINE BESYLATE 10 MG PO TABS: 10.0000 mg | ORAL_TABLET | Freq: Every day | ORAL | Status: DC

## 2014-09-22 NOTE — Telephone Encounter (Signed)
-----   Message from Arnoldo Lenis, MD sent at 09/22/2014  4:41 PM EDT ----- BP log reviewed, on average higher than goal. Would increase his norvasc back to 10mg  daily.   Zandra Abts MD

## 2014-09-22 NOTE — Telephone Encounter (Signed)
Pt made aware, updated medication list

## 2014-10-15 ENCOUNTER — Other Ambulatory Visit: Payer: Self-pay | Admitting: *Deleted

## 2014-10-15 MED ORDER — METOPROLOL SUCCINATE ER 25 MG PO TB24: 25.0000 mg | ORAL_TABLET | Freq: Every day | ORAL | Status: DC

## 2014-10-22 ENCOUNTER — Ambulatory Visit (INDEPENDENT_AMBULATORY_CARE_PROVIDER_SITE_OTHER): Payer: Medicare Other | Admitting: Cardiology

## 2014-10-22 ENCOUNTER — Encounter: Payer: Self-pay | Admitting: Cardiology

## 2014-10-22 VITALS — BP 119/78 | HR 97 | Ht 71.0 in | Wt 196.0 lb

## 2014-10-22 DIAGNOSIS — I5022 Chronic systolic (congestive) heart failure: Secondary | ICD-10-CM | POA: Diagnosis not present

## 2014-10-22 DIAGNOSIS — I10 Essential (primary) hypertension: Secondary | ICD-10-CM

## 2014-10-22 DIAGNOSIS — I5020 Unspecified systolic (congestive) heart failure: Secondary | ICD-10-CM

## 2014-10-22 DIAGNOSIS — I509 Heart failure, unspecified: Secondary | ICD-10-CM | POA: Diagnosis not present

## 2014-10-22 DIAGNOSIS — I442 Atrioventricular block, complete: Secondary | ICD-10-CM

## 2014-10-22 DIAGNOSIS — R7309 Other abnormal glucose: Secondary | ICD-10-CM | POA: Diagnosis not present

## 2014-10-22 MED ORDER — METOPROLOL SUCCINATE ER 25 MG PO TB24: 25.0000 mg | ORAL_TABLET | Freq: Every day | ORAL | Status: DC

## 2014-10-22 NOTE — Patient Instructions (Signed)
Your physician wants you to follow-up in: [place] DR. BRANCH You will receive a reminder letter in the mail two months in advance. If you don't receive a letter, please call our office to schedule the follow-up appointment.  Your physician recommends that you continue on your current medications as directed. Please refer to the Current Medication list given to you today.  Your physician recommends that you return for lab work CBC/CMP/MAG/TSH/HGBA1C/LIPIDS - PLEASE FAST PRIOR TO LAB WORK  Your physician has requested that you have an echocardiogram. Echocardiography is a painless test that uses sound waves to create images of your heart. It provides your doctor with information about the size and shape of your heart and how well your heart's chambers and valves are working. This procedure takes approximately one hour. There are no restrictions for this procedure.   Thank you for choosing [facility]!!

## 2014-10-22 NOTE — Progress Notes (Signed)
Clinical Summary Mr. Parkerson is a 64 y.o.male seen today for follow up of the following medical problems.   1. Low normal to mildly decreased LV systolic function  - echo 09/2013 LVEF 41-74%, grade I diastolic dysfunction. Apex appears to be hypokinetic though poor visualization  - prior cath in [facility] 10/2012 with only mid LAD 20% lesion, otherwise patent vessels.  - denies any SOB, no DOE since last visit. Denies any chest pain.   2. Non-obstructive CAD  - mild LAD lesion from cath 2014  - denies any chest pain   3. Complete heart block  - [employer] Jude pacemaker followed by Dr Rayann Heman  - normal function by last check 06/2014. - Denies any lightheadeness or dizzines.   4. CKD  - followed by pcp  5. HTN  - home bp log is at goal   Past Medical History  Diagnosis Date  . Chronic renal insufficiency     baseline creatinine is 1.8  . GERD (gastroesophageal reflux disease)   . Chronic systolic dysfunction of left ventricle 4/15    EF 40-45% in setting of PMT on limited echo  . Hypertrophy of prostate with urinary obstruction and other lower urinary tract symptoms (LUTS)     INDWELLING FOLEY CATHETER  . Chronic kidney disease, stage III (moderate)   . Vitamin B12 deficiency   . Hypertension   . Mild pulmonary hypertension   . Metabolic syndrome   . COPD (chronic obstructive pulmonary disease)   . Complete heart block   . Shortness of breath     WITH EXERTION  . Arthritis     MIDDLE FINGER AND RING FINGER BOTH HANDS  . Pain     BACK PAIN AT TIMES  . Hemorrhoids   . Pacemaker     IMPLANTED Mar 14, 2013 - DR. ALLRED WITH [facility] FOLLOWS DEVICE FUNCTIONING  . Coronary artery disease     NON OBSTRUCTIVE CAD; DR. Harl Bowie WITH [facility] IS PT'S CARDIOLOGIST     No Known Allergies   Current Outpatient Prescriptions  Medication Sig Dispense Refill  . amLODipine (NORVASC) 10 MG tablet Take 1 tablet (10 mg total) by mouth daily. 30 tablet 3    . aspirin EC 81 MG tablet Take 81 mg by mouth every morning.    . metoprolol succinate (TOPROL XL) 25 MG 24 hr tablet Take 1 tablet (25 mg total) by mouth daily. 30 tablet 6  . omeprazole (PRILOSEC) 40 MG capsule Take 1 capsule by mouth daily.    . tamsulosin (FLOMAX) 0.4 MG CAPS capsule Take 0.4 mg by mouth at bedtime.      No current facility-administered medications for this visit.     Past Surgical History  Procedure Laterality Date  . Pacemaker insertion  03/14/13    SJM Endurity implanted by Dr Truman Hayward in [place] for complete heart block  . Other surgical history      Intestinal blockage x2  . Other surgical history  1964    Football injury  . Colon surgery      COLON RESECTION FOR BLOCKAGE  . Cardiac catheterization  2014  . Green light laser turp (transurethral resection of prostate N/A 02/13/2014    Procedure: GREEN LIGHT LASER TURP (TRANSURETHRAL RESECTION OF PROSTATE;  Surgeon: Festus Aloe, MD;  Location: [facility];  Service: Urology;  Laterality: N/A;     No Known Allergies    Family History  Problem Relation Age of Onset  .  Cancer Father      Social History Mr. Burks reports that he quit smoking about 3 years ago. His smoking use included Cigarettes. He started smoking about 48 years ago. He has a 45 pack-year smoking history. He has never used smokeless tobacco. Mr. Monarch reports that he does not drink alcohol.   Review of Systems CONSTITUTIONAL: No weight loss, fever, chills, weakness or fatigue.  HEENT: Eyes: No visual loss, blurred vision, double vision or yellow sclerae.No hearing loss, sneezing, congestion, runny nose or sore throat.  SKIN: No rash or itching.  CARDIOVASCULAR: per HPI RESPIRATORY: No shortness of breath, cough or sputum.  GASTROINTESTINAL: No anorexia, nausea, vomiting or diarrhea. No abdominal pain or blood.  GENITOURINARY: No burning on urination, no polyuria NEUROLOGICAL: No headache, dizziness, syncope, paralysis,  ataxia, numbness or tingling in the extremities. No change in bowel or bladder control.  MUSCULOSKELETAL: No muscle, back pain, joint pain or stiffness.  LYMPHATICS: No enlarged nodes. No history of splenectomy.  PSYCHIATRIC: No history of depression or anxiety.  ENDOCRINOLOGIC: No reports of sweating, cold or heat intolerance. No polyuria or polydipsia.  Marland Kitchen   Physical Examination Filed Vitals:   10/22/14 1020  BP: 119/78  Pulse: 97   Filed Vitals:   10/22/14 1020  Height: 5\' 11"  (1.803 m)  Weight: 196 lb (88.905 kg)    Gen: resting comfortably, no acute distress HEENT: no scleral icterus, pupils equal round and reactive, no palptable cervical adenopathy,  CV: RRR, no m/r/g, no JVD Resp: Clear to auscultation bilaterally GI: abdomen is soft, non-tender, non-distended, normal bowel sounds, no hepatosplenomegaly MSK: extremities are warm, no edema.  Skin: warm, no rash Neuro:  no focal deficits Psych: appropriate affect   Diagnostic Studies  09/2013 Echo  Study Conclusions  - Left ventricle: The cavity size was normal. Wall thickness was normal. Systolic function was mildly reduced. The estimated ejection fraction was in the range of 45% to 50%. Doppler parameters are consistent with abnormal left ventricular relaxation (grade 1 diastolic dysfunction). - Regional wall motion abnormality: Several of the views are foreshortened, however overall the apex does appear hypokinetic. Hypokinesis of the apical anterior, apical inferior, apical septal, apical lateral, and apical myocardium. Can consider echo with contrast for more definitive evaluation of wall motion. - Aortic valve: Mildly calcified annulus. Trileaflet; mildly thickened leaflets. Valve area (VTI): 3.76 cm^2. Valve area (Vmax): 3.65 cm^2. - Mitral valve: Mildly calcified annulus. Mildly thickened leaflets . 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 - Systemic veins: The IVC is small,  suggesting low RA pressures and hypovolemia.  10/2012 Cath [facility]  RHC: RA 5, PA 40/12 (mean not reported) PCWP 12, PA sat 75%.  LHC: LM patent, LAD mid 20%, LCX patent, RCA anomolous origine from left cusp patent. LVEF 70%.    Assessment and Plan   1. Low normal to mildly decreased LV systolic function  - echo 09/2013 LVEF 45-50%, prior cath 2014 with patent coronaries  - denies any current symptoms.  - will repeat echo, if progression of dysfunction will need further titration of medical therapy.   2. Non-obstructive CAD  - cath with mild LAD lesion, denies any symptoms  - aggressive risk factor modification.  3. CKD  - per pcp   4. HTN  - bp's at goal based on home numbers, continue current meds  5. Complete heart block - pacemaker with normal function on last check, no current symptoms - continue to follow  Repeat annual labs. F/u  1 year/    Arnoldo Lenis, M.D.

## 2014-10-30 ENCOUNTER — Ambulatory Visit (INDEPENDENT_AMBULATORY_CARE_PROVIDER_SITE_OTHER): Payer: Medicare Other | Admitting: Internal Medicine

## 2014-10-30 ENCOUNTER — Encounter: Payer: Self-pay | Admitting: Internal Medicine

## 2014-10-30 VITALS — BP 122/72 | HR 64 | Ht 71.0 in | Wt 196.0 lb

## 2014-10-30 DIAGNOSIS — I10 Essential (primary) hypertension: Secondary | ICD-10-CM

## 2014-10-30 DIAGNOSIS — I442 Atrioventricular block, complete: Secondary | ICD-10-CM | POA: Diagnosis not present

## 2014-10-30 NOTE — Progress Notes (Signed)
Electrophysiology Office Note   Date:  10/30/2014   ID:  Troy Petty, DOB 03/15/1951, MRN [id number]  PCP:  Jacqualine Code, DO  Cardiologist:  Dr Harl Bowie Primary Electrophysiologist: Thompson Grayer, MD    No chief complaint on file.    History of Present Illness: Troy Petty is a 64 y.o. male who presents today for electrophysiology evaluation.   He is doing very well.  He recently remodeled his bathroom and is pleased with this.  He remains active. Today, he denies symptoms of palpitations, chest pain, shortness of breath, orthopnea, PND, lower extremity edema, claudication, dizziness, presyncope, syncope, bleeding, or neurologic sequela. The patient is tolerating medications without difficulties and is otherwise without complaint today.    Past Medical History  Diagnosis Date  . Chronic renal insufficiency     baseline creatinine is 1.8  . GERD (gastroesophageal reflux disease)   . Chronic systolic dysfunction of left ventricle 4/15    EF 40-45% in setting of PMT on limited echo  . Hypertrophy of prostate with urinary obstruction and other lower urinary tract symptoms (LUTS)     INDWELLING FOLEY CATHETER  . Chronic kidney disease, stage III (moderate)   . Vitamin B12 deficiency   . Hypertension   . Mild pulmonary hypertension   . Metabolic syndrome   . COPD (chronic obstructive pulmonary disease)   . Complete heart block   . Shortness of breath     WITH EXERTION  . Arthritis     MIDDLE FINGER AND RING FINGER BOTH HANDS  . Pain     BACK PAIN AT TIMES  . Hemorrhoids   . Pacemaker     IMPLANTED Mar 14, 2013 - DR. Kalen Ratajczak WITH [facility] FOLLOWS DEVICE FUNCTIONING  . Coronary artery disease     NON OBSTRUCTIVE CAD; DR. Harl Bowie WITH [facility] IS PT'S CARDIOLOGIST   Past Surgical History  Procedure Laterality Date  . Pacemaker insertion  03/14/13    SJM Endurity implanted by Dr Truman Hayward in [place] for complete heart block  . Other surgical  history      Intestinal blockage x2  . Other surgical history  1964    Football injury  . Colon surgery      COLON RESECTION FOR BLOCKAGE  . Cardiac catheterization  2014  . Green light laser turp (transurethral resection of prostate N/A 02/13/2014    Procedure: GREEN LIGHT LASER TURP (TRANSURETHRAL RESECTION OF PROSTATE;  Surgeon: Festus Aloe, MD;  Location: [facility];  Service: Urology;  Laterality: N/A;     Current Outpatient Prescriptions  Medication Sig Dispense Refill  . amLODipine (NORVASC) 10 MG tablet Take 1 tablet (10 mg total) by mouth daily. 30 tablet 3  . aspirin EC 81 MG tablet Take 81 mg by mouth every morning.    . metoprolol succinate (TOPROL XL) 25 MG 24 hr tablet Take 1 tablet (25 mg total) by mouth daily. 90 tablet 3  . omeprazole (PRILOSEC) 40 MG capsule Take 1 capsule by mouth daily.     No current facility-administered medications for this visit.    Allergies:   Review of patient's allergies indicates no known allergies.   Social History:  The patient  reports that he quit smoking about 3 years ago. His smoking use included Cigarettes. He started smoking about 48 years ago. He has a 45 pack-year smoking history. He has never used smokeless tobacco. He reports that he does not drink alcohol or use illicit drugs.   Family History:  The patient's family history includes Cancer in his father.    ROS:  Please see the history of present illness.   All other systems are reviewed and negative.    PHYSICAL EXAM: VS:  BP 122/72 mmHg  Pulse 64  Ht 5\' 11"  (1.803 m)  Wt 88.905 kg (196 lb)  BMI 27.35 kg/m2  SpO2 97% , BMI Body mass index is 27.35 kg/(m^2). GEN: Well nourished, well developed, in no acute distress HEENT: normal Neck: no JVD, carotid bruits, or masses Cardiac: RRR; no murmurs, rubs, or gallops,no edema  Respiratory:  clear to auscultation bilaterally, normal work of breathing GI: soft, nontender, nondistended, + BS MS: no deformity or  atrophy Skin: warm and dry, device pocket is well healed Neuro:  Strength and sensation are intact Psych: euthymic mood, full affect  Device interrogation is reviewed today in detail.  See PaceArt for details.   Recent Labs: 01/01/2014: ALT 16 02/10/2014: BUN 21; Creatinine, Ser 2.22*; Hemoglobin 11.8*; Platelets 378; Potassium 4.8; Sodium 139    Lipid Panel  No results found for: CHOL, TRIG, HDL, CHOLHDL, VLDL, LDLCALC, LDLDIRECT   Wt Readings from Last 3 Encounters:  10/30/14 88.905 kg (196 lb)  10/22/14 88.905 kg (196 lb)  04/14/14 90.266 kg (199 lb)      Other studies Reviewed: Additional studies/ records that were reviewed today include: Dr Nelly Laurence notes    ASSESSMENT AND PLAN:   1. Transient Complete heart block Normal pacemaker function V paces only 22% See Pace Art report No changes today  2. HTN Stable No change required today   Follow-up: merlin Return to see me in 1 year  Current medicines are reviewed at length with the patient today.   The patient does not have concerns regarding his medicines.  The following changes were made today:  none   Signed, Thompson Grayer, MD  10/30/2014 11:21 AM     [facility] [address] [place] [address] [phone] (office) [phone] (fax)

## 2014-10-30 NOTE — Patient Instructions (Signed)
Your physician recommends that you continue on your current medications as directed. Please refer to the Current Medication list given to you today. Merlin device check on 02/01/15. Your physician recommends that you schedule a follow-up appointment in: 1 year with Dr. Rayann Heman. You will receive a reminder letter in the mail in about 10 months reminding you to call and schedule your appointment. If you don't receive this letter, please contact our office.

## 2014-11-03 LAB — CUP PACEART INCLINIC DEVICE CHECK
Battery Voltage: 3.01 V
Brady Statistic RV Percent Paced: 22 %
Date Time Interrogation Session: 20160701145926
Lead Channel Pacing Threshold Amplitude: 0.5 V
Lead Channel Pacing Threshold Pulse Width: 0.4 ms
Lead Channel Pacing Threshold Pulse Width: 0.4 ms
Lead Channel Setting Pacing Amplitude: 1 V
Lead Channel Setting Pacing Pulse Width: 0.4 ms
MDC IDC MSMT BATTERY REMAINING LONGEVITY: 142.8 mo
MDC IDC MSMT LEADCHNL RA IMPEDANCE VALUE: 387.5 Ohm
MDC IDC MSMT LEADCHNL RA SENSING INTR AMPL: 5 mV
MDC IDC MSMT LEADCHNL RV IMPEDANCE VALUE: 512.5 Ohm
MDC IDC MSMT LEADCHNL RV PACING THRESHOLD AMPLITUDE: 0.75 V
MDC IDC MSMT LEADCHNL RV SENSING INTR AMPL: 11.1 mV
MDC IDC SET LEADCHNL RA PACING AMPLITUDE: 2.5 V
MDC IDC SET LEADCHNL RV SENSING SENSITIVITY: 4 mV
MDC IDC STAT BRADY RA PERCENT PACED: 1.2 %
Pulse Gen Serial Number: 7520753

## 2014-11-05 ENCOUNTER — Ambulatory Visit (INDEPENDENT_AMBULATORY_CARE_PROVIDER_SITE_OTHER): Payer: Medicare Other

## 2014-11-05 ENCOUNTER — Other Ambulatory Visit: Payer: Self-pay

## 2014-11-05 DIAGNOSIS — I5022 Chronic systolic (congestive) heart failure: Secondary | ICD-10-CM | POA: Diagnosis not present

## 2014-11-10 ENCOUNTER — Telehealth: Payer: Self-pay | Admitting: *Deleted

## 2014-11-10 ENCOUNTER — Encounter: Payer: Self-pay | Admitting: *Deleted

## 2014-11-10 NOTE — Telephone Encounter (Signed)
-----   Message from Arnoldo Lenis, MD sent at 11/06/2014  2:27 PM EDT ----- Echo looks good, heart is nice and strong. Did he ever go and have his blood work done?  Zandra Abts MD

## 2014-11-10 NOTE — Telephone Encounter (Signed)
Notes Recorded by Laurine Blazer, LPN on 09/11/6045 at 99:87 AM Patient notified. Stated he did get labs done at [place] office last Wednesday. Will request these today.

## 2014-11-16 ENCOUNTER — Telehealth: Payer: Self-pay

## 2014-11-16 NOTE — Telephone Encounter (Signed)
Pt already informed by pcp

## 2014-11-16 NOTE — Telephone Encounter (Signed)
-----   Message from Arnoldo Lenis, MD sent at 11/16/2014  3:06 PM EDT ----- Marena Chancy if pcp updated him on labs, but they look good. Kidney function continues to improve.  Zandra Abts MD

## 2014-12-10 ENCOUNTER — Other Ambulatory Visit: Payer: Self-pay | Admitting: *Deleted

## 2014-12-10 MED ORDER — AMLODIPINE BESYLATE 10 MG PO TABS: 10.0000 mg | ORAL_TABLET | Freq: Every day | ORAL | Status: DC

## 2015-02-01 ENCOUNTER — Telehealth: Payer: Self-pay | Admitting: Cardiology

## 2015-02-01 ENCOUNTER — Ambulatory Visit (INDEPENDENT_AMBULATORY_CARE_PROVIDER_SITE_OTHER): Payer: Medicare Other | Admitting: *Deleted

## 2015-02-01 DIAGNOSIS — I442 Atrioventricular block, complete: Secondary | ICD-10-CM

## 2015-02-01 NOTE — Telephone Encounter (Signed)
LMOVM reminding pt to send remote transmission.   

## 2015-02-02 NOTE — Progress Notes (Signed)
Remote pacemaker transmission.   

## 2015-02-04 ENCOUNTER — Other Ambulatory Visit: Payer: Self-pay | Admitting: *Deleted

## 2015-02-04 LAB — CUP PACEART REMOTE DEVICE CHECK
Battery Voltage: 2.99 V
Brady Statistic AP VP Percent: 4.6 %
Brady Statistic AP VS Percent: 1 %
Brady Statistic AS VP Percent: 87 %
Brady Statistic AS VS Percent: 7.5 %
Brady Statistic RA Percent Paced: 4.2 %
Date Time Interrogation Session: 20161003164548
Lead Channel Impedance Value: 330 Ohm
Lead Channel Impedance Value: 540 Ohm
Lead Channel Pacing Threshold Pulse Width: 0.4 ms
Lead Channel Pacing Threshold Pulse Width: 0.4 ms
Lead Channel Sensing Intrinsic Amplitude: 12 mV
Lead Channel Setting Pacing Amplitude: 1 V
Lead Channel Setting Pacing Amplitude: 2.5 V
Lead Channel Setting Sensing Sensitivity: 4 mV
MDC IDC MSMT BATTERY REMAINING LONGEVITY: 127 mo
MDC IDC MSMT BATTERY REMAINING PERCENTAGE: 95.5 %
MDC IDC MSMT LEADCHNL RA PACING THRESHOLD AMPLITUDE: 0.5 V
MDC IDC MSMT LEADCHNL RA SENSING INTR AMPL: 4.9 mV
MDC IDC MSMT LEADCHNL RV PACING THRESHOLD AMPLITUDE: 0.75 V
MDC IDC SET LEADCHNL RV PACING PULSEWIDTH: 0.4 ms
MDC IDC STAT BRADY RV PERCENT PACED: 91 %
Pulse Gen Serial Number: 7520753

## 2015-02-04 MED ORDER — AMLODIPINE BESYLATE 10 MG PO TABS: 10.0000 mg | ORAL_TABLET | Freq: Every day | ORAL | Status: DC

## 2015-02-04 NOTE — Telephone Encounter (Signed)
Pt says [facility] pharmacy needed rx sent for amlodipine 10 mg daily. Medication sent to pharmacy with 90 day supply as requested

## 2015-02-15 ENCOUNTER — Encounter: Payer: Self-pay | Admitting: *Deleted

## 2015-05-04 ENCOUNTER — Ambulatory Visit (INDEPENDENT_AMBULATORY_CARE_PROVIDER_SITE_OTHER): Payer: Medicare Other | Admitting: *Deleted

## 2015-05-04 ENCOUNTER — Telehealth: Payer: Self-pay | Admitting: Cardiology

## 2015-05-04 DIAGNOSIS — I442 Atrioventricular block, complete: Secondary | ICD-10-CM | POA: Diagnosis not present

## 2015-05-04 NOTE — Telephone Encounter (Signed)
Spoke with pt and reminded pt of remote transmission that is due today. Pt verbalized understanding.   

## 2015-05-05 NOTE — Progress Notes (Signed)
Remote pacemaker transmission.   

## 2015-05-12 ENCOUNTER — Encounter: Payer: Self-pay | Admitting: Cardiology

## 2015-05-12 LAB — CUP PACEART REMOTE DEVICE CHECK
Battery Remaining Longevity: 125 mo
Battery Remaining Percentage: 95.5 %
Battery Voltage: 2.99 V
Brady Statistic AS VP Percent: 91 %
Brady Statistic AS VS Percent: 3.8 %
Brady Statistic RV Percent Paced: 96 %
Date Time Interrogation Session: 20170104155826
Implantable Lead Implant Date: 20141114
Implantable Lead Implant Date: 20141114
Implantable Lead Location: 753860
Lead Channel Pacing Threshold Amplitude: 0.625 V
Lead Channel Pacing Threshold Pulse Width: 0.4 ms
Lead Channel Pacing Threshold Pulse Width: 0.4 ms
Lead Channel Sensing Intrinsic Amplitude: 12 mV
Lead Channel Setting Pacing Amplitude: 2.5 V
Lead Channel Setting Sensing Sensitivity: 4 mV
MDC IDC LEAD LOCATION: 753859
MDC IDC MSMT LEADCHNL RA IMPEDANCE VALUE: 330 Ohm
MDC IDC MSMT LEADCHNL RA SENSING INTR AMPL: 4.6 mV
MDC IDC MSMT LEADCHNL RV IMPEDANCE VALUE: 480 Ohm
MDC IDC MSMT LEADCHNL RV PACING THRESHOLD AMPLITUDE: 0.75 V
MDC IDC PG SERIAL: 7520753
MDC IDC SET LEADCHNL RV PACING AMPLITUDE: 1 V
MDC IDC SET LEADCHNL RV PACING PULSEWIDTH: 0.4 ms
MDC IDC STAT BRADY AP VP PERCENT: 4.1 %
MDC IDC STAT BRADY AP VS PERCENT: 1 %
MDC IDC STAT BRADY RA PERCENT PACED: 3.6 %

## 2015-08-03 ENCOUNTER — Telehealth: Payer: Self-pay | Admitting: Cardiology

## 2015-08-03 ENCOUNTER — Other Ambulatory Visit: Payer: Self-pay | Admitting: Cardiology

## 2015-08-03 ENCOUNTER — Ambulatory Visit (INDEPENDENT_AMBULATORY_CARE_PROVIDER_SITE_OTHER): Payer: Medicare Other | Admitting: *Deleted

## 2015-08-03 DIAGNOSIS — I442 Atrioventricular block, complete: Secondary | ICD-10-CM

## 2015-08-03 NOTE — Telephone Encounter (Signed)
Spoke with pt and reminded pt of remote transmission that is due today. Pt verbalized understanding.   

## 2015-08-03 NOTE — Progress Notes (Signed)
Remote pacemaker transmission.   

## 2015-09-23 ENCOUNTER — Encounter: Payer: Self-pay | Admitting: Cardiology

## 2015-09-23 LAB — CUP PACEART REMOTE DEVICE CHECK
Battery Remaining Longevity: 125 mo
Battery Remaining Percentage: 95.5 %
Brady Statistic AP VP Percent: 3.7 %
Brady Statistic AP VS Percent: 1 %
Brady Statistic RA Percent Paced: 3.2 %
Brady Statistic RV Percent Paced: 97 %
Date Time Interrogation Session: 20170404190751
Implantable Lead Implant Date: 20141114
Implantable Lead Location: 753859
Lead Channel Impedance Value: 490 Ohm
Lead Channel Pacing Threshold Amplitude: 0.875 V
Lead Channel Sensing Intrinsic Amplitude: 12 mV
Lead Channel Sensing Intrinsic Amplitude: 5 mV
Lead Channel Setting Pacing Amplitude: 2.5 V
Lead Channel Setting Pacing Pulse Width: 0.4 ms
MDC IDC LEAD IMPLANT DT: 20141114
MDC IDC LEAD LOCATION: 753860
MDC IDC MSMT BATTERY VOLTAGE: 2.99 V
MDC IDC MSMT LEADCHNL RA IMPEDANCE VALUE: 350 Ohm
MDC IDC MSMT LEADCHNL RA PACING THRESHOLD AMPLITUDE: 0.5 V
MDC IDC MSMT LEADCHNL RA PACING THRESHOLD PULSEWIDTH: 0.4 ms
MDC IDC MSMT LEADCHNL RV PACING THRESHOLD PULSEWIDTH: 0.4 ms
MDC IDC PG SERIAL: 7520753
MDC IDC SET LEADCHNL RV PACING AMPLITUDE: 1.125
MDC IDC SET LEADCHNL RV SENSING SENSITIVITY: 4 mV
MDC IDC STAT BRADY AS VP PERCENT: 93 %
MDC IDC STAT BRADY AS VS PERCENT: 2.6 %

## 2015-11-12 ENCOUNTER — Ambulatory Visit (INDEPENDENT_AMBULATORY_CARE_PROVIDER_SITE_OTHER): Payer: Medicare Other | Admitting: Internal Medicine

## 2015-11-12 ENCOUNTER — Encounter: Payer: Self-pay | Admitting: Internal Medicine

## 2015-11-12 VITALS — BP 133/81 | HR 61 | Ht 71.0 in | Wt 202.0 lb

## 2015-11-12 DIAGNOSIS — I10 Essential (primary) hypertension: Secondary | ICD-10-CM | POA: Diagnosis not present

## 2015-11-12 DIAGNOSIS — I442 Atrioventricular block, complete: Secondary | ICD-10-CM

## 2015-11-12 LAB — CUP PACEART INCLINIC DEVICE CHECK
Battery Remaining Longevity: 126
Battery Voltage: 2.99 V
Brady Statistic RA Percent Paced: 2.7 %
Date Time Interrogation Session: 20170714131657
Implantable Lead Implant Date: 20141114
Implantable Lead Implant Date: 20141114
Implantable Lead Location: 753859
Lead Channel Impedance Value: 337.5 Ohm
Lead Channel Pacing Threshold Amplitude: 0.5 V
Lead Channel Pacing Threshold Amplitude: 0.5 V
Lead Channel Pacing Threshold Amplitude: 1.25 V
Lead Channel Pacing Threshold Amplitude: 1.25 V
Lead Channel Pacing Threshold Pulse Width: 0.4 ms
Lead Channel Pacing Threshold Pulse Width: 0.4 ms
Lead Channel Pacing Threshold Pulse Width: 0.4 ms
Lead Channel Sensing Intrinsic Amplitude: 12 mV
Lead Channel Sensing Intrinsic Amplitude: 5 mV
Lead Channel Setting Pacing Amplitude: 1.25 V
Lead Channel Setting Pacing Amplitude: 2.5 V
Lead Channel Setting Pacing Pulse Width: 0.4 ms
MDC IDC LEAD LOCATION: 753860
MDC IDC MSMT LEADCHNL RA PACING THRESHOLD PULSEWIDTH: 0.4 ms
MDC IDC MSMT LEADCHNL RV IMPEDANCE VALUE: 462.5 Ohm
MDC IDC SET LEADCHNL RV SENSING SENSITIVITY: 4 mV
MDC IDC STAT BRADY RV PERCENT PACED: 97 %
Pulse Gen Serial Number: 7520753

## 2015-11-12 MED ORDER — METOPROLOL SUCCINATE ER 25 MG PO TB24: 12.5000 mg | ORAL_TABLET | Freq: Every day | ORAL | Status: DC

## 2015-11-12 NOTE — Patient Instructions (Addendum)
Medication Instructions:  Your physician has recommended you make the following change in your medication:  Decrease metoprolol succinate 25 mg to 1/2 tablet daily to equal 12.5 mg daily. Please break your 25 mg tablet in half daily. Continue all other medications the same.  Labwork: NONE  Testing/Procedures: NONE  Follow-Up: Your physician recommends that you schedule a follow-up appointment in: 1 year with Dr. Rayann Heman. Please schedule this appointment today before leaving the office. Your next device check is on 02/14/16.   Any Other Special Instructions Will Be Listed Below (If Applicable).  If you need a refill on your cardiac medications before your next appointment, please call your pharmacy.

## 2015-11-12 NOTE — Progress Notes (Signed)
Electrophysiology Office Note   Date:  11/12/2015   ID:  Troy Petty, DOB 09/06/1950, MRN [id number]  PCP:  Jacqualine Code, DO  Cardiologist:  Dr Harl Bowie Primary Electrophysiologist: Thompson Grayer, MD    Chief Complaint  Patient presents with  . Shortness of Breath     History of Present Illness: Troy Petty is a 65 y.o. male who presents today for electrophysiology evaluation.   He is doing very well.  He remains active.  He is remodeling his mothers house with plans to rent it out.  He has occasional SOB but describes this as mild and better than a year ago (EF at that time 60%).  He does not feel limited in activity. Today, he denies symptoms of palpitations, chest pain, orthopnea, PND, lower extremity edema, claudication, dizziness, presyncope, syncope, bleeding, or neurologic sequela. The patient is tolerating medications without difficulties and is otherwise without complaint today.    Past Medical History  Diagnosis Date  . Chronic renal insufficiency     baseline creatinine is 1.8  . GERD (gastroesophageal reflux disease)   . Chronic systolic dysfunction of left ventricle 4/15    EF 40-45% in setting of PMT on limited echo  . Hypertrophy of prostate with urinary obstruction and other lower urinary tract symptoms (LUTS)     INDWELLING FOLEY CATHETER  . Chronic kidney disease, stage III (moderate)   . Vitamin B12 deficiency   . Hypertension   . Mild pulmonary hypertension ([place])   . Metabolic syndrome   . COPD (chronic obstructive pulmonary disease) ([place])   . Complete heart block ([place])   . Shortness of breath     WITH EXERTION  . Arthritis     MIDDLE FINGER AND RING FINGER BOTH HANDS  . Pain     BACK PAIN AT TIMES  . Hemorrhoids   . Pacemaker     IMPLANTED Mar 14, 2013 - DR. Jackquelyn Sundberg WITH [facility] FOLLOWS DEVICE FUNCTIONING  . Coronary artery disease     NON OBSTRUCTIVE CAD; DR. Harl Bowie WITH [facility] IS PT'S CARDIOLOGIST   Past  Surgical History  Procedure Laterality Date  . Pacemaker insertion  03/14/13    SJM Endurity implanted by Dr Truman Hayward in [place] for complete heart block  . Other surgical history      Intestinal blockage x2  . Other surgical history  1964    Football injury  . Colon surgery      COLON RESECTION FOR BLOCKAGE  . Cardiac catheterization  2014  . Green light laser turp (transurethral resection of prostate N/A 02/13/2014    Procedure: GREEN LIGHT LASER TURP (TRANSURETHRAL RESECTION OF PROSTATE;  Surgeon: Festus Aloe, MD;  Location: [facility];  Service: Urology;  Laterality: N/A;     Current Outpatient Prescriptions  Medication Sig Dispense Refill  . amLODipine (NORVASC) 10 MG tablet Take 1 tablet (10 mg total) by mouth daily. 90 tablet 3  . aspirin EC 81 MG tablet Take 81 mg by mouth every morning.    . metoprolol succinate (TOPROL-XL) 25 MG 24 hr tablet Take 0.5 tablets (12.5 mg total) by mouth daily. 45 tablet 3  . omeprazole (PRILOSEC) 40 MG capsule Take 1 capsule by mouth daily.     No current facility-administered medications for this visit.    Allergies:   Review of patient's allergies indicates no known allergies.   Social History:  The patient  reports that he quit smoking about 4 years ago. His smoking use included  Cigarettes. He started smoking about 49 years ago. He has a 45 pack-year smoking history. He has never used smokeless tobacco. He reports that he does not drink alcohol or use illicit drugs.   Family History:  The patient's family history includes Cancer in his father.    ROS:  Please see the history of present illness.   All other systems are reviewed and negative.    PHYSICAL EXAM: VS:  BP 133/81 mmHg  Pulse 61  Ht 5\' 11"  (1.803 m)  Wt 202 lb (91.627 kg)  BMI 28.19 kg/m2  SpO2 94% , BMI Body mass index is 28.19 kg/(m^2). GEN: Well nourished, well developed, in no acute distress HEENT: normal Neck: no JVD, carotid bruits, or masses Cardiac: RRR; no  murmurs, rubs, or gallops,no edema  Respiratory:  clear to auscultation bilaterally, normal work of breathing GI: soft, nontender, nondistended, + BS MS: no deformity or atrophy Skin: warm and dry, device pocket is well healed Neuro:  Strength and sensation are intact Psych: euthymic mood, full affect  Device interrogation is reviewed today in detail.  See PaceArt for details.    Wt Readings from Last 3 Encounters:  11/12/15 202 lb (91.627 kg)  10/30/14 196 lb (88.905 kg)  10/22/14 196 lb (88.905 kg)      Other studies Reviewed: Additional studies/ records that were reviewed today include: echo 2016   ASSESSMENT AND PLAN:   1. Complete heart block Normal pacemaker function V pacing now 97% (only 22% a year ago) Some transient atrial lead noise noted however lead function is otherwise ok and only A paces 2% See Pace Art report No changes today  2. HTN Stable Reduce toprol to 12.5mg  daily given mild SOB   Follow-up: merlin Return to see me in 1 year  Current medicines are reviewed at length with the patient today.   The patient does not have concerns regarding his medicines.  The following changes were made today:  none   Signed, Thompson Grayer, MD  11/12/2015 11:51 AM     [facility] [address] [place] [address] [phone] (office) [phone] (fax)

## 2015-12-11 ENCOUNTER — Other Ambulatory Visit: Payer: Self-pay | Admitting: Cardiology

## 2015-12-22 ENCOUNTER — Encounter: Payer: Self-pay | Admitting: *Deleted

## 2015-12-23 ENCOUNTER — Ambulatory Visit (INDEPENDENT_AMBULATORY_CARE_PROVIDER_SITE_OTHER): Payer: Medicare Other | Admitting: Cardiology

## 2015-12-23 ENCOUNTER — Encounter: Payer: Self-pay | Admitting: Cardiology

## 2015-12-23 VITALS — BP 136/86 | HR 71 | Ht 71.0 in | Wt 203.6 lb

## 2015-12-23 DIAGNOSIS — I10 Essential (primary) hypertension: Secondary | ICD-10-CM

## 2015-12-23 DIAGNOSIS — I442 Atrioventricular block, complete: Secondary | ICD-10-CM

## 2015-12-23 DIAGNOSIS — I2510 Atherosclerotic heart disease of native coronary artery without angina pectoris: Secondary | ICD-10-CM | POA: Diagnosis not present

## 2015-12-23 DIAGNOSIS — I5022 Chronic systolic (congestive) heart failure: Secondary | ICD-10-CM | POA: Diagnosis not present

## 2015-12-23 NOTE — Patient Instructions (Signed)

## 2015-12-23 NOTE — Progress Notes (Signed)
Clinical Summary Troy Petty is a 65 y.o.male seen today for follow up of the following medical problems.  1. Low normal to mildly decreased LV systolic function  - echo 09/2013 LVEF [id number], grade I diastolic dysfunction. Apex appears to be hypokinetic though poor visualization  - prior cath in [facility] 10/2012 with only mid LAD 20% lesion, otherwise patent vessels.  - repeat echo 10/2014 with LVEF 60-65%, no WMAs.  - Toprol decreased during EP visit due to mild SOB. Breathing improved lower dose Toprol.    2. Non-obstructive CAD  - mild LAD lesion from cath 2014  - denies any chest pain since last visit.   3. Complete heart block  - [employer] Jude pacemaker followed by Dr Troy Petty  - normal function by last check 06/2014.  - Denies any lightheadeness or dizzines since last visit  4. CKD  - followed by pcp  5. HTN  - does not check at home regularly -compliant with meds   Past Medical History:  Diagnosis Date  . Arthritis    MIDDLE FINGER AND RING FINGER BOTH HANDS  . Chronic kidney disease, stage III (moderate)   . Chronic renal insufficiency    baseline creatinine is 1.8  . Chronic systolic dysfunction of left ventricle 4/15   EF 40-45% in setting of PMT on limited echo  . Complete heart block ([place])   . COPD (chronic obstructive pulmonary disease) ([place])   . Coronary artery disease    NON OBSTRUCTIVE CAD; DR. Harl Bowie WITH [facility] IS PT'S CARDIOLOGIST  . GERD (gastroesophageal reflux disease)   . Hemorrhoids   . Hypertension   . Hypertrophy of prostate with urinary obstruction and other lower urinary tract symptoms (LUTS)    INDWELLING FOLEY CATHETER  . Metabolic syndrome   . Mild pulmonary hypertension ([place])   . Pacemaker    IMPLANTED Mar 14, 2013 - DR. ALLRED WITH [facility] FOLLOWS DEVICE FUNCTIONING  . Pain    BACK PAIN AT TIMES  . Shortness of breath    WITH EXERTION  . Vitamin B12 deficiency      No Known  Allergies   Current Outpatient Prescriptions  Medication Sig Dispense Refill  . amLODipine (NORVASC) 10 MG tablet TAKE 1 TABLET (10 MG TOTAL) BY MOUTH DAILY. 90 tablet 3  . aspirin EC 81 MG tablet Take 81 mg by mouth every morning.    . metoprolol succinate (TOPROL-XL) 25 MG 24 hr tablet Take 0.5 tablets (12.5 mg total) by mouth daily. 45 tablet 3  . omeprazole (PRILOSEC) 40 MG capsule Take 1 capsule by mouth daily.     No current facility-administered medications for this visit.      Past Surgical History:  Procedure Laterality Date  . CARDIAC CATHETERIZATION  2014  . COLON SURGERY     COLON RESECTION FOR BLOCKAGE  . GREEN LIGHT LASER TURP (TRANSURETHRAL RESECTION OF PROSTATE N/A 02/13/2014   Procedure: GREEN LIGHT LASER TURP (TRANSURETHRAL RESECTION OF PROSTATE;  Surgeon: Festus Aloe, MD;  Location: [facility];  Service: Urology;  Laterality: N/A;  . OTHER SURGICAL HISTORY     Intestinal blockage x2  . OTHER SURGICAL HISTORY  1964   Football injury  . PACEMAKER INSERTION  03/14/13   SJM Endurity implanted by Dr Truman Hayward in [place] for complete heart block     No Known Allergies    Family History  Problem Relation Age of Onset  . Cancer Father      Social  History Mr. Gurnee reports that he quit smoking about 4 years ago. His smoking use included Cigarettes. He started smoking about 49 years ago. He has a 45.00 pack-year smoking history. He has never used smokeless tobacco. Mr. Paladino reports that he does not drink alcohol.   Review of Systems CONSTITUTIONAL: No weight loss, fever, chills, weakness or fatigue.  HEENT: Eyes: No visual loss, blurred vision, double vision or yellow sclerae.No hearing loss, sneezing, congestion, runny nose or sore throat.  SKIN: No rash or itching.  CARDIOVASCULAR: per HPI RESPIRATORY: No shortness of breath, cough or sputum.  GASTROINTESTINAL: No anorexia, nausea, vomiting or diarrhea. No abdominal pain or blood.  GENITOURINARY:  No burning on urination, no polyuria NEUROLOGICAL: No headache, dizziness, syncope, paralysis, ataxia, numbness or tingling in the extremities. No change in bowel or bladder control.  MUSCULOSKELETAL: No muscle, back pain, joint pain or stiffness.  LYMPHATICS: No enlarged nodes. No history of splenectomy.  PSYCHIATRIC: No history of depression or anxiety.  ENDOCRINOLOGIC: No reports of sweating, cold or heat intolerance. No polyuria or polydipsia.  Marland Kitchen   Physical Examination Vitals:   12/23/15 1105  BP: 136/86  Pulse: 71   Vitals:   12/23/15 1105  Weight: 203 lb 9.6 oz (92.4 kg)  Height: 5\' 11"  (1.803 m)    Gen: resting comfortably, no acute distress HEENT: no scleral icterus, pupils equal round and reactive, no palptable cervical adenopathy,  CV: RRR, no m/r/g, no jvd Resp: Clear to auscultation bilaterally GI: abdomen is soft, non-tender, non-distended, normal bowel sounds, no hepatosplenomegaly MSK: extremities are warm, no edema.  Skin: warm, no rash Neuro:  no focal deficits Psych: appropriate affect   Diagnostic Studies 09/2013 Echo  Study Conclusions  - Left ventricle: The cavity size was normal. Wall thickness was normal. Systolic function was mildly reduced. The estimated ejection fraction was in the range of 45% to 50%. Doppler parameters are consistent with abnormal left ventricular relaxation (grade 1 diastolic dysfunction). - Regional wall motion abnormality: Several of the views are foreshortened, however overall the apex does appear hypokinetic. Hypokinesis of the apical anterior, apical inferior, apical septal, apical lateral, and apical myocardium. Can consider echo with contrast for more definitive evaluation of wall motion. - Aortic valve: Mildly calcified annulus. Trileaflet; mildly thickened leaflets. Valve area (VTI): 3.76 cm^2. Valve area (Vmax): 3.65 cm^2. - Mitral valve: Mildly calcified annulus. Mildly thickened leaflets . 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 - Systemic veins: The IVC is small, suggesting low RA pressures and hypovolemia.   10/2012 [facility]  RHC: RA 5, PA 40/12 (mean not reported) PCWP 12, PA sat 75%.  LHC: LM patent, LAD mid 20%, LCX patent, RCA anomolous origine from left cusp patent. LVEF 70%.   10/2014 echo Study Conclusions  - Left ventricle: The cavity size was normal. Wall thickness was   increased in a pattern of mild LVH. Systolic function was normal.   The estimated ejection fraction was in the range of 60% to 65%.   Wall motion was normal; there were no regional wall motion   abnormalities. Doppler parameters are consistent with abnormal   left ventricular relaxation (grade 1 diastolic dysfunction). - Aortic valve: Mildly calcified annulus. Trileaflet; mildly   thickened leaflets. Valve area (VTI): 4.75 cm^2. Valve area   (Vmax): 3.73 cm^2. Valve area (Vmean): 3.54 cm^2. - Mitral valve: Mildly calcified annulus. Mildly thickened leaflets   . - Left atrium: The atrium was mildly dilated. - Atrial septum: No  defect or patent foramen ovale was identified. - Pulmonary arteries: Systolic pressure was moderately increased.   PA peak pressure: 44 mm Hg (S). - Technically adequate study.   Assessment and Plan  1. Low normal to mildly decreased LV systolic function  - LVEF has now normalized, no current symptoms, continue current meds   2. Non-obstructive CAD  - cath with mild LAD lesion - no recent symptoms, continue current meds  3. CKD  - per pcp   4. HTN  - bp's at goal, continue current meds  5. Complete heart block - pacemaker with normal function on last check - continue to monitor   F/u 1 year/       Arnoldo Lenis, M.D.

## 2015-12-30 ENCOUNTER — Telehealth: Payer: Self-pay | Admitting: Internal Medicine

## 2015-12-30 NOTE — Telephone Encounter (Signed)
Patient called stating that he saw on TV today about his device being hacked into.  Please advise.  [phone].

## 2015-12-30 NOTE — Telephone Encounter (Signed)
Spoke with pt wife and informed her that this is a theoretical risk. No device has ever been hacked. We do have firmware upgrade for these devices available, but our clinic (along with many other clinics across the country) has determined that the risks of installing the upgrade outweigh the benefit. If in the future we determine that it would be beneficial, the firmware upgrade is available to [place] and we will notify our patients. She wanted to know if she could look for any signs that his device has been tampered with. I informed her that this theoretical and that if it did happen he would feel different / bad. Pt wife verbalized understanding.

## 2016-02-14 ENCOUNTER — Telehealth: Payer: Self-pay | Admitting: Cardiology

## 2016-02-14 ENCOUNTER — Ambulatory Visit (INDEPENDENT_AMBULATORY_CARE_PROVIDER_SITE_OTHER): Payer: Medicare Other | Admitting: *Deleted

## 2016-02-14 DIAGNOSIS — I442 Atrioventricular block, complete: Secondary | ICD-10-CM | POA: Diagnosis not present

## 2016-02-14 NOTE — Telephone Encounter (Signed)
Spoke with pt and reminded pt of remote transmission that is due today. Pt verbalized understanding.   

## 2016-02-15 NOTE — Progress Notes (Signed)
Remote pacemaker transmission.   

## 2016-02-16 ENCOUNTER — Encounter: Payer: Self-pay | Admitting: Cardiology

## 2016-02-25 LAB — CUP PACEART REMOTE DEVICE CHECK
Battery Remaining Longevity: 122 mo
Battery Remaining Percentage: 95.5 %
Brady Statistic RA Percent Paced: 1.5 %
Brady Statistic RV Percent Paced: 99 %
Date Time Interrogation Session: 20171016193359
Implantable Lead Location: 753859
Implantable Lead Location: 753860
Lead Channel Impedance Value: 340 Ohm
Lead Channel Impedance Value: 450 Ohm
Lead Channel Pacing Threshold Pulse Width: 0.4 ms
Lead Channel Sensing Intrinsic Amplitude: 5 mV
Lead Channel Setting Pacing Amplitude: 1.25 V
Lead Channel Setting Pacing Amplitude: 2.5 V
Lead Channel Setting Sensing Sensitivity: 4 mV
MDC IDC LEAD IMPLANT DT: 20141114
MDC IDC LEAD IMPLANT DT: 20141114
MDC IDC MSMT BATTERY VOLTAGE: 2.99 V
MDC IDC MSMT LEADCHNL RA PACING THRESHOLD AMPLITUDE: 0.625 V
MDC IDC MSMT LEADCHNL RV PACING THRESHOLD AMPLITUDE: 1 V
MDC IDC MSMT LEADCHNL RV PACING THRESHOLD PULSEWIDTH: 0.4 ms
MDC IDC MSMT LEADCHNL RV SENSING INTR AMPL: 12 mV
MDC IDC PG SERIAL: 7520753
MDC IDC SET LEADCHNL RV PACING PULSEWIDTH: 0.4 ms
MDC IDC STAT BRADY AP VP PERCENT: 1.9 %
MDC IDC STAT BRADY AP VS PERCENT: 1 %
MDC IDC STAT BRADY AS VP PERCENT: 98 %
MDC IDC STAT BRADY AS VS PERCENT: 1 %

## 2016-03-15 ENCOUNTER — Telehealth: Payer: Self-pay

## 2016-03-15 NOTE — Telephone Encounter (Signed)
[phone] patient received letter to schedule tcs

## 2016-03-17 ENCOUNTER — Telehealth: Payer: Self-pay

## 2016-03-17 NOTE — Telephone Encounter (Signed)
Pt was returning DS call. Please call [phone]

## 2016-03-17 NOTE — Telephone Encounter (Signed)
See triage

## 2016-03-17 NOTE — Telephone Encounter (Signed)
Tried to call and mail box full, could not leave a message.

## 2016-03-29 NOTE — Telephone Encounter (Signed)
Gastroenterology Pre-Procedure Review  Request Date: 03/17/2016 Requesting Physician: [facility]  PATIENT REVIEW QUESTIONS: The patient responded to the following health history questions as indicated:    PT HAS A PACEMAKER  1. Diabetes Melitis: no 2. Joint replacements in the past 12 months: no 3. Major health problems in the past 3 months: no 4. Has an artificial valve or MVP: no 5. Has a defibrillator: no 6. Has been advised in past to take antibiotics in advance of a procedure like teeth cleaning: no 7. Family history of colon cancer: YES   Father age 42 8. Alcohol Use: YES    2 beers a night 9. History of sleep apnea: no  10. History of coronary artery or other vascular stents placed within the last 12 months: no    MEDICATIONS & ALLERGIES:    Patient reports the following regarding taking any blood thinners:   Plavix? no Aspirin? no Coumadin? no Brilinta? no Xarelto? no Eliquis? no Pradaxa? no Savaysa? no Effient? no  Patient confirms/reports the following medications:  Current Outpatient Prescriptions  Medication Sig Dispense Refill  . amLODipine (NORVASC) 10 MG tablet TAKE 1 TABLET (10 MG TOTAL) BY MOUTH DAILY. 90 tablet 3  . aspirin EC 81 MG tablet Take 81 mg by mouth every morning.    . metoprolol succinate (TOPROL-XL) 25 MG 24 hr tablet Take 0.5 tablets (12.5 mg total) by mouth daily. 45 tablet 3  . omeprazole (PRILOSEC) 40 MG capsule Take 1 capsule by mouth daily. As needed only     No current facility-administered medications for this visit.     Patient confirms/reports the following allergies:  No Known Allergies  No orders of the defined types were placed in this encounter.   AUTHORIZATION INFORMATION Primary Insurance:   ID #:   Group #:  Pre-Cert / Auth required:  Pre-Cert / Auth #:   Secondary Insurance: ID #:  Group #:  Pre-Cert / Auth required: Pre-Cert / Auth #:   SCHEDULE INFORMATION: Procedure has been scheduled as  follows:  Date:  04/12/2016             Time:  10:15 am Location: [place]  This Gastroenterology Pre-Precedure Review Form is being routed to the following provider(s): R. Garfield Cornea, MD

## 2016-03-29 NOTE — Telephone Encounter (Signed)
Will need Phenergan 25 mg IV on call for sedation. Do we need to bring them in for a visit for this?

## 2016-03-30 ENCOUNTER — Other Ambulatory Visit: Payer: Self-pay

## 2016-03-30 DIAGNOSIS — Z1211 Encounter for screening for malignant neoplasm of colon: Secondary | ICD-10-CM

## 2016-03-30 MED ORDER — PEG 3350-KCL-NA BICARB-NACL 420 G PO SOLR: 4000.0000 mL | ORAL | 0 refills | Status: DC

## 2016-03-30 NOTE — Telephone Encounter (Signed)
I spoke with [name], she is aware we can add phenergan pre-procedure and the pt does not have to come in for an office visit.

## 2016-03-30 NOTE — Telephone Encounter (Signed)
Phenergan orders added to procedure order. Rx sent to the pharmacy and instructions mailed to pt.

## 2016-03-30 NOTE — Addendum Note (Signed)
Addended by: Everardo All on: 03/30/2016 11:57 AM   Modules accepted: Orders

## 2016-04-10 NOTE — Telephone Encounter (Signed)
Troy Petty and [name] are aware pt has a pacemaker.

## 2016-04-11 MED ORDER — PROMETHAZINE HCL 25 MG/ML IJ SOLN: 12.5000 mg | Freq: Once | INTRAMUSCULAR | Status: DC

## 2016-04-11 NOTE — Telephone Encounter (Signed)
Pt's wife called and said he is on the schedule tomorrow for his colonoscopy and has not received his prep instructions. ( they were mailed on 03/30/2016).  He has not had any solid food this morning, only coffee. So I told her that should be fine just make sure he DOES NOT [place] , just clear liquids.  I am faxing the instructions to her at [phone].   Sending FYI to Roseanne Kaufman, NP who signed off on this procedure.

## 2016-04-11 NOTE — Telephone Encounter (Signed)
Phenergan will be 12.5 mg IV per Melanie in Endo due to age.

## 2016-04-12 ENCOUNTER — Telehealth (INDEPENDENT_AMBULATORY_CARE_PROVIDER_SITE_OTHER): Payer: Self-pay | Admitting: Internal Medicine

## 2016-04-12 ENCOUNTER — Inpatient Hospital Stay (HOSPITAL_COMMUNITY)
Admission: EM | Admit: 2016-04-12 | Discharge: 2016-04-13 | DRG: 920 | Disposition: A | Discharge status: Home / Self Care | Payer: Medicare Other | Attending: Internal Medicine | Admitting: Internal Medicine

## 2016-04-12 ENCOUNTER — Ambulatory Visit (HOSPITAL_BASED_OUTPATIENT_CLINIC_OR_DEPARTMENT_OTHER)
Admission: RE | Admit: 2016-04-12 | Discharge: 2016-04-12 | Disposition: A | Discharge status: Home / Self Care | Payer: Medicare Other | Source: Ambulatory Visit | Attending: Internal Medicine | Admitting: Internal Medicine

## 2016-04-12 ENCOUNTER — Encounter (HOSPITAL_COMMUNITY): Payer: Self-pay | Admitting: *Deleted

## 2016-04-12 ENCOUNTER — Encounter (HOSPITAL_COMMUNITY)
Admission: EM | Disposition: A | Discharge status: Home / Self Care | Payer: Self-pay | Source: Home / Self Care | Attending: Internal Medicine

## 2016-04-12 ENCOUNTER — Encounter (HOSPITAL_COMMUNITY)
Admission: RE | Disposition: A | Discharge status: Home / Self Care | Payer: Self-pay | Source: Ambulatory Visit | Attending: Internal Medicine

## 2016-04-12 DIAGNOSIS — I272 Pulmonary hypertension, unspecified: Secondary | ICD-10-CM | POA: Diagnosis present

## 2016-04-12 DIAGNOSIS — K91840 Postprocedural hemorrhage and hematoma of a digestive system organ or structure following a digestive system procedure: Principal | ICD-10-CM | POA: Diagnosis present

## 2016-04-12 DIAGNOSIS — E8881 Metabolic syndrome: Secondary | ICD-10-CM | POA: Diagnosis present

## 2016-04-12 DIAGNOSIS — D123 Benign neoplasm of transverse colon: Secondary | ICD-10-CM | POA: Insufficient documentation

## 2016-04-12 DIAGNOSIS — Z87891 Personal history of nicotine dependence: Secondary | ICD-10-CM | POA: Insufficient documentation

## 2016-04-12 DIAGNOSIS — R6510 Systemic inflammatory response syndrome (SIRS) of non-infectious origin without acute organ dysfunction: Secondary | ICD-10-CM | POA: Diagnosis not present

## 2016-04-12 DIAGNOSIS — Z1211 Encounter for screening for malignant neoplasm of colon: Secondary | ICD-10-CM

## 2016-04-12 DIAGNOSIS — Y838 Other surgical procedures as the cause of abnormal reaction of the patient, or of later complication, without mention of misadventure at the time of the procedure: Secondary | ICD-10-CM | POA: Diagnosis present

## 2016-04-12 DIAGNOSIS — N183 Chronic kidney disease, stage 3 (moderate): Secondary | ICD-10-CM | POA: Insufficient documentation

## 2016-04-12 DIAGNOSIS — E538 Deficiency of other specified B group vitamins: Secondary | ICD-10-CM | POA: Diagnosis present

## 2016-04-12 DIAGNOSIS — M19042 Primary osteoarthritis, left hand: Secondary | ICD-10-CM | POA: Diagnosis present

## 2016-04-12 DIAGNOSIS — Z950 Presence of cardiac pacemaker: Secondary | ICD-10-CM | POA: Insufficient documentation

## 2016-04-12 DIAGNOSIS — K219 Gastro-esophageal reflux disease without esophagitis: Secondary | ICD-10-CM | POA: Diagnosis present

## 2016-04-12 DIAGNOSIS — Z79899 Other long term (current) drug therapy: Secondary | ICD-10-CM | POA: Insufficient documentation

## 2016-04-12 DIAGNOSIS — K649 Unspecified hemorrhoids: Secondary | ICD-10-CM | POA: Diagnosis present

## 2016-04-12 DIAGNOSIS — I2510 Atherosclerotic heart disease of native coronary artery without angina pectoris: Secondary | ICD-10-CM | POA: Diagnosis present

## 2016-04-12 DIAGNOSIS — M19041 Primary osteoarthritis, right hand: Secondary | ICD-10-CM | POA: Diagnosis present

## 2016-04-12 DIAGNOSIS — A419 Sepsis, unspecified organism: Secondary | ICD-10-CM

## 2016-04-12 DIAGNOSIS — J449 Chronic obstructive pulmonary disease, unspecified: Secondary | ICD-10-CM

## 2016-04-12 DIAGNOSIS — Z7982 Long term (current) use of aspirin: Secondary | ICD-10-CM

## 2016-04-12 DIAGNOSIS — Z9049 Acquired absence of other specified parts of digestive tract: Secondary | ICD-10-CM | POA: Insufficient documentation

## 2016-04-12 DIAGNOSIS — Z980 Intestinal bypass and anastomosis status: Secondary | ICD-10-CM | POA: Insufficient documentation

## 2016-04-12 DIAGNOSIS — I129 Hypertensive chronic kidney disease with stage 1 through stage 4 chronic kidney disease, or unspecified chronic kidney disease: Secondary | ICD-10-CM | POA: Insufficient documentation

## 2016-04-12 DIAGNOSIS — K922 Gastrointestinal hemorrhage, unspecified: Secondary | ICD-10-CM | POA: Diagnosis present

## 2016-04-12 DIAGNOSIS — Z808 Family history of malignant neoplasm of other organs or systems: Secondary | ICD-10-CM

## 2016-04-12 DIAGNOSIS — K633 Ulcer of intestine: Secondary | ICD-10-CM

## 2016-04-12 DIAGNOSIS — Z800 Family history of malignant neoplasm of digestive organs: Secondary | ICD-10-CM

## 2016-04-12 DIAGNOSIS — K921 Melena: Secondary | ICD-10-CM | POA: Diagnosis present

## 2016-04-12 DIAGNOSIS — Z1212 Encounter for screening for malignant neoplasm of rectum: Secondary | ICD-10-CM | POA: Diagnosis not present

## 2016-04-12 HISTORY — PX: COLONOSCOPY: SHX5424

## 2016-04-12 LAB — CBC WITH DIFFERENTIAL/PLATELET
Basophils Absolute: 0 10*3/uL (ref 0.0–0.1)
Basophils Relative: 0 %
Eosinophils Absolute: 0.1 10*3/uL (ref 0.0–0.7)
Eosinophils Relative: 1 %
HEMATOCRIT: 50.3 % (ref 39.0–52.0)
HEMOGLOBIN: 15.7 g/dL (ref 13.0–17.0)
LYMPHS ABS: 1.5 10*3/uL (ref 0.7–4.0)
Lymphocytes Relative: 8 %
MCH: 28.2 pg (ref 26.0–34.0)
MCHC: 31.2 g/dL (ref 30.0–36.0)
MCV: 90.3 fL (ref 78.0–100.0)
MONOS PCT: 8 %
Monocytes Absolute: 1.5 10*3/uL — ABNORMAL HIGH (ref 0.1–1.0)
NEUTROS ABS: 15 10*3/uL — AB (ref 1.7–7.7)
NEUTROS PCT: 83 %
Platelets: 265 10*3/uL (ref 150–400)
RBC: 5.57 MIL/uL (ref 4.22–5.81)
RDW: 14.2 % (ref 11.5–15.5)
WBC: 18.1 10*3/uL — AB (ref 4.0–10.5)

## 2016-04-12 LAB — I-STAT CHEM 8, ED
BUN: 13 mg/dL (ref 6–20)
CHLORIDE: 107 mmol/L (ref 101–111)
Calcium, Ion: 1.15 mmol/L (ref 1.15–1.40)
Creatinine, Ser: 1.7 mg/dL — ABNORMAL HIGH (ref 0.61–1.24)
Glucose, Bld: 94 mg/dL (ref 65–99)
HEMATOCRIT: 53 % — AB (ref 39.0–52.0)
Hemoglobin: 18 g/dL — ABNORMAL HIGH (ref 13.0–17.0)
POTASSIUM: 3.6 mmol/L (ref 3.5–5.1)
SODIUM: 144 mmol/L (ref 135–145)
TCO2: 25 mmol/L (ref 0–100)

## 2016-04-12 LAB — PROTIME-INR
INR: 0.94
Prothrombin Time: 12.6 seconds (ref 11.4–15.2)

## 2016-04-12 IMAGING — US US RENAL
1 series · 14 of 25 positions shown · non-contrast
Comparison: None.

CLINICAL DATA: Evaluate for hydronephrosis.

EXAM:
RENAL/URINARY TRACT ULTRASOUND COMPLETE

[Series 1: us renal · 0.23mm/px · 14 of 43 slices shown]
[im 1/43]
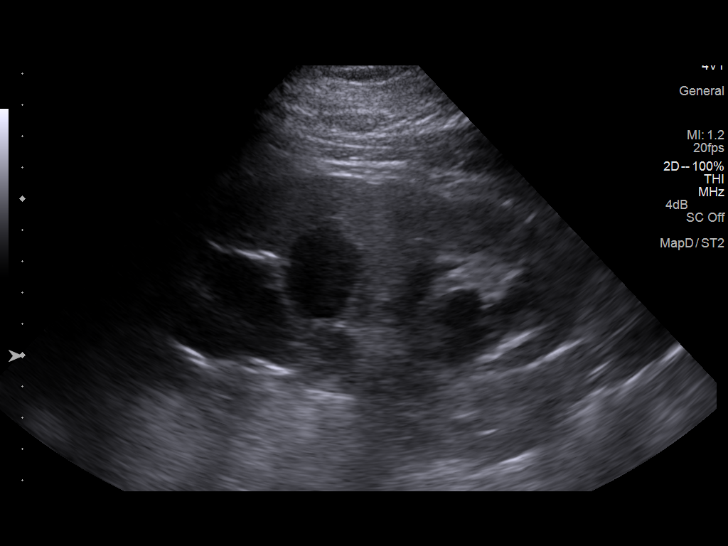
[im 4/43]
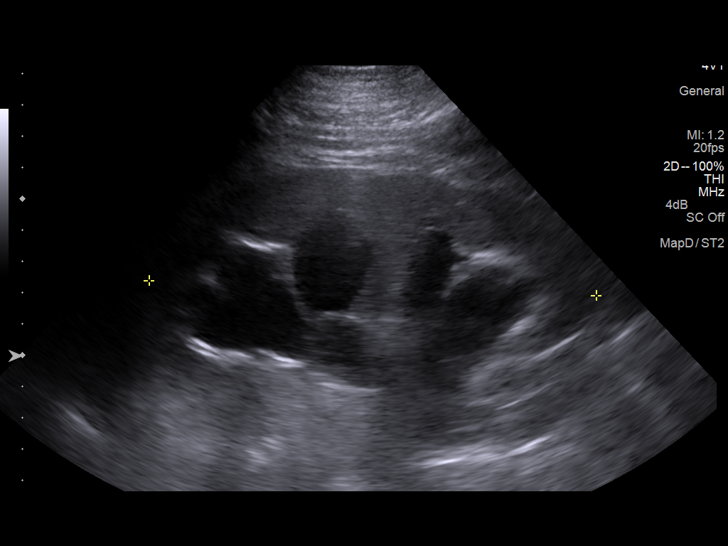
[im 8/43]
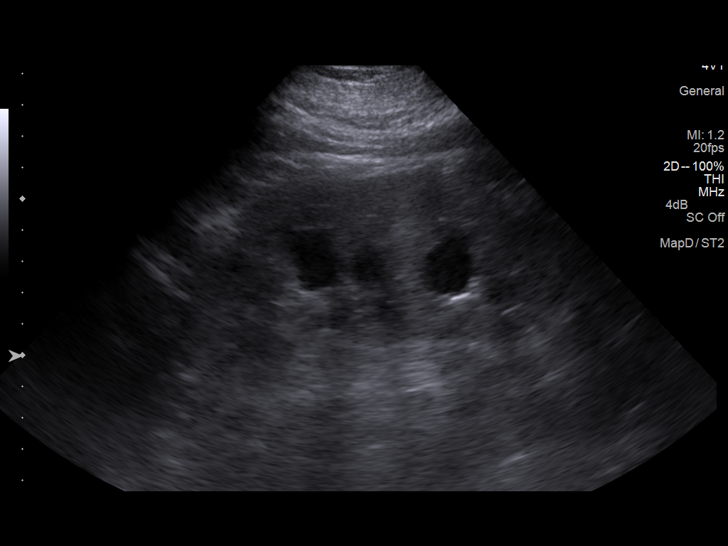
[im 11/43]
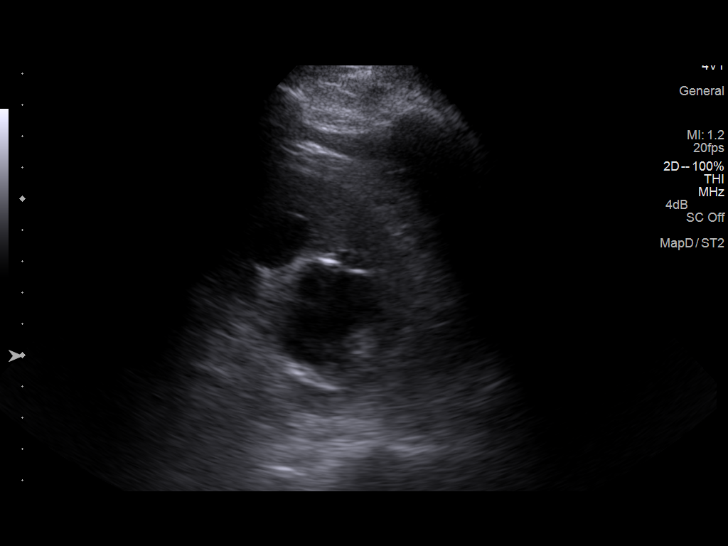
[im 15/43]
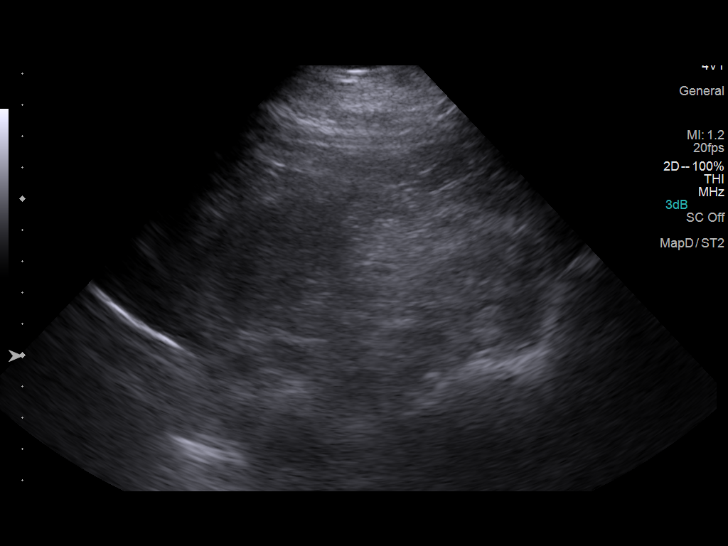
[im 16/43]
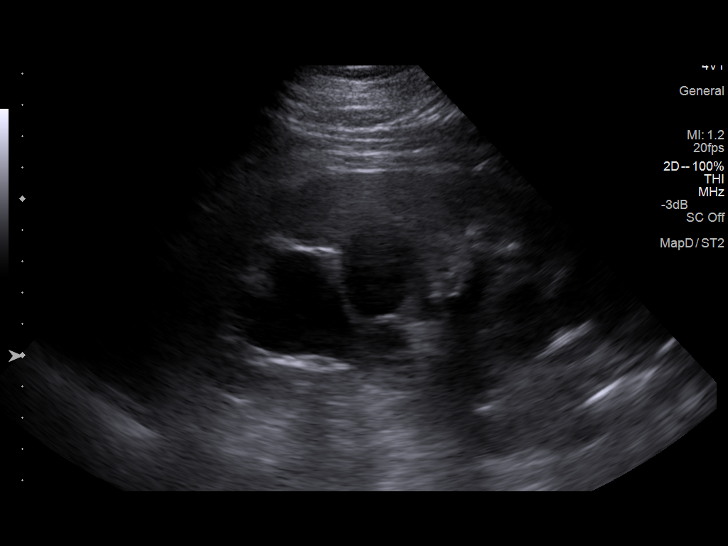
[im 20/43]
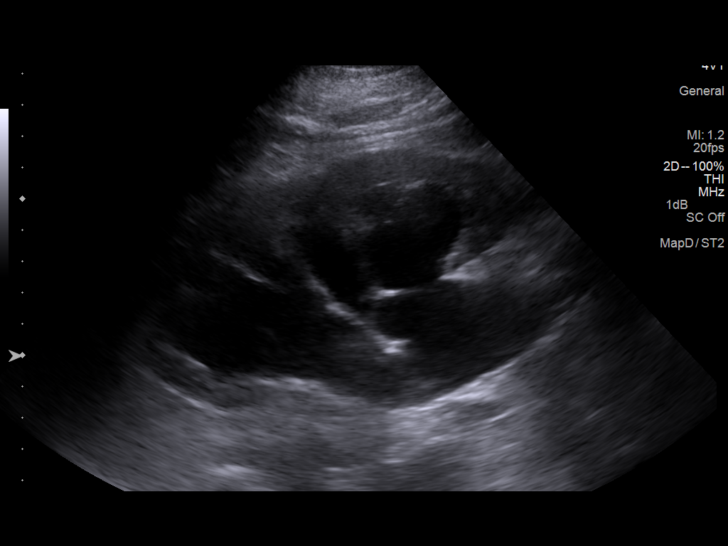
[im 23/43]
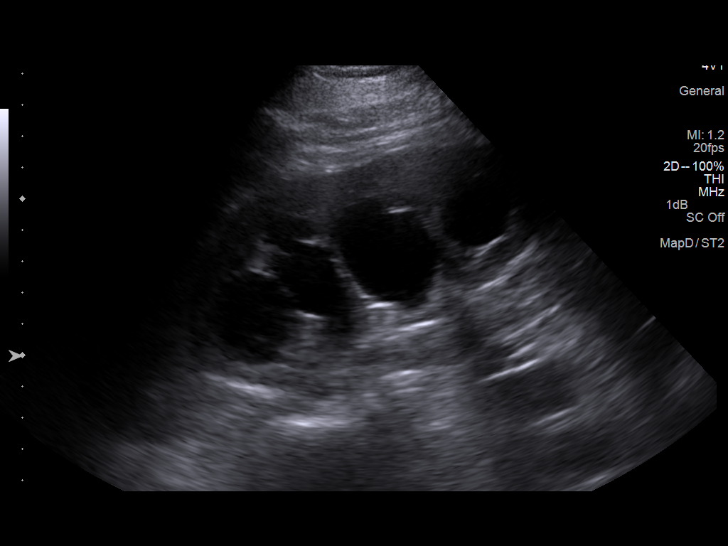
[im 27/43]
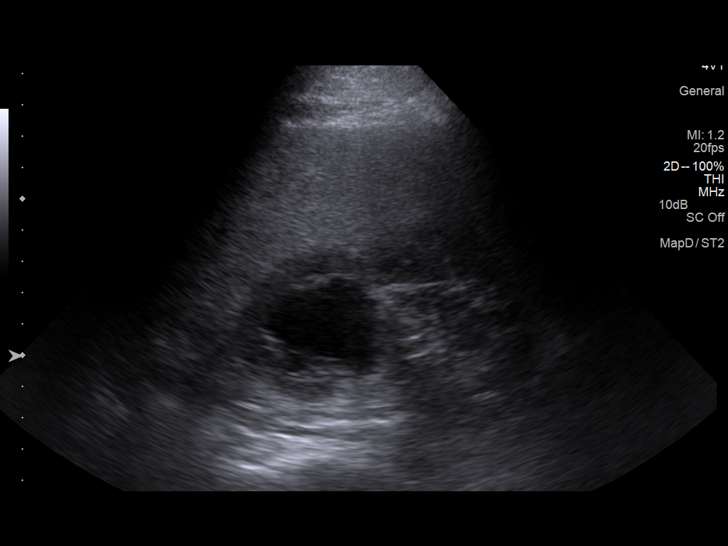
[im 29/43]
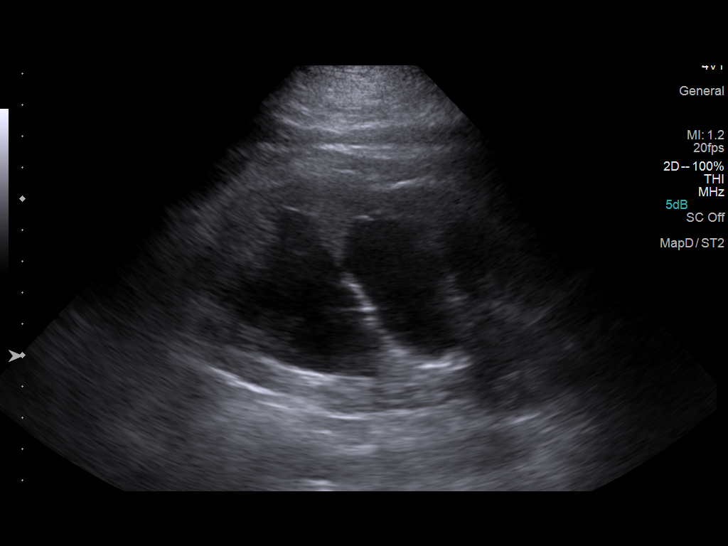
[im 32/43]
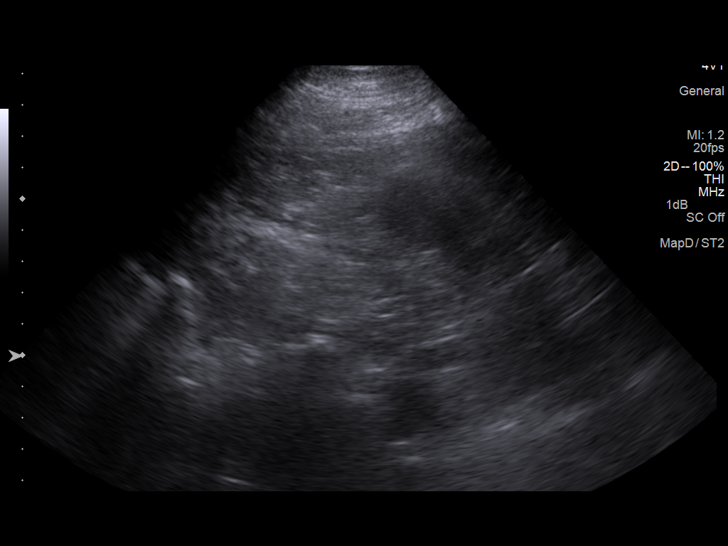
[im 36/43]
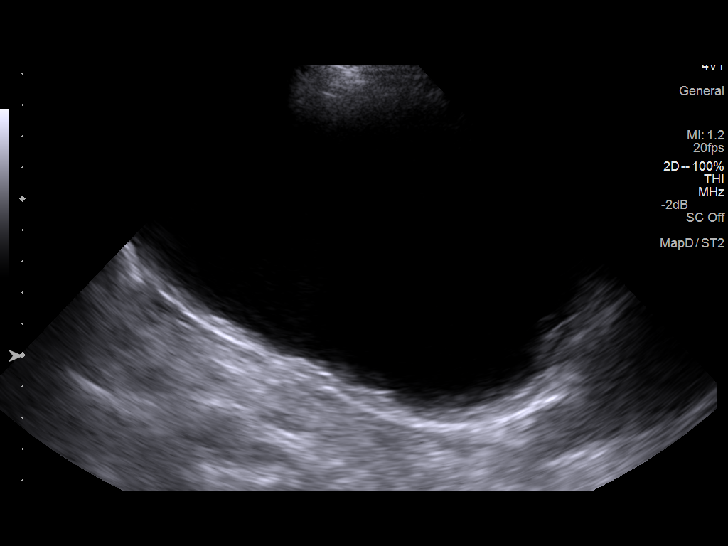
[im 39/43]
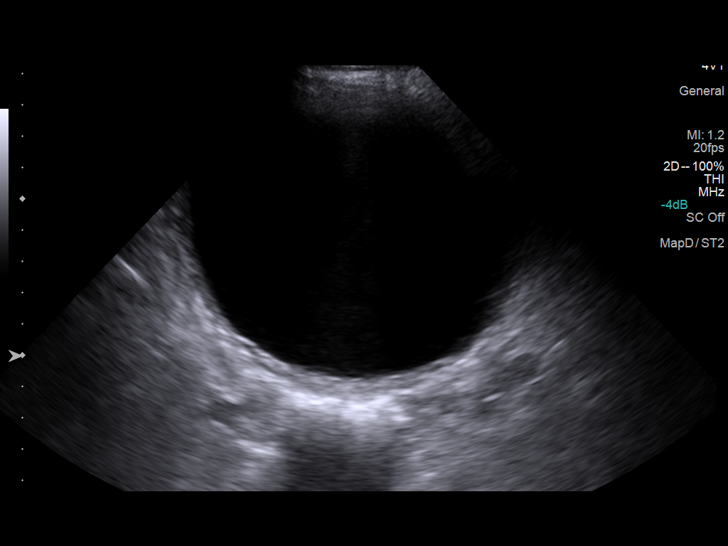
[im 43/43]
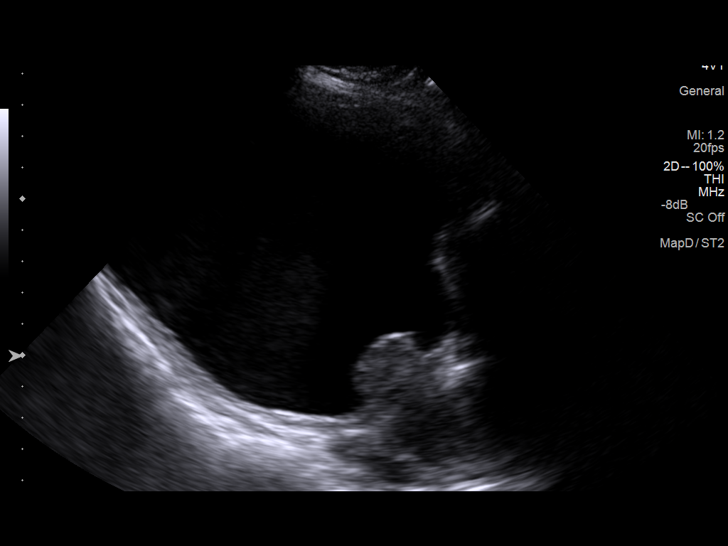

[14 of 25 positions shown; findings below may reference images not displayed]

FINDINGS: Right Kidney:

Length: 13.4 cm. Severe hydronephrosis and hydroureter of the
visualized portion of the proximal segment. Echogenicity upper
normal to mildly increased.

Left Kidney:

Length: 14.3 cm..  Moderate hydronephrosis.

Bladder:

And mild bladder wall thickening is nonspecific. Prostate gland is
enlarged and lobular in contour measuring up to 5.8 cm.
IMPRESSION: Severe right and moderate left hydronephrosis.

Nonspecific mild bladder wall thickening.

Prostatomegaly.

## 2016-04-12 SURGERY — COLONOSCOPY
Anesthesia: Moderate Sedation

## 2016-04-12 MED ORDER — ONDANSETRON HCL 4 MG/2ML IJ SOLN
INTRAMUSCULAR | Status: DC | PRN
  Administered 2016-04-12: 4 mg via INTRAVENOUS

## 2016-04-12 MED ORDER — METOPROLOL TARTRATE 5 MG/5ML IV SOLN
INTRAVENOUS | Status: DC | PRN
Start: 2016-04-12 — End: 2016-04-13
  Administered 2016-04-12: 2.5 mg via INTRAVENOUS

## 2016-04-12 MED ORDER — MIDAZOLAM HCL 5 MG/5ML IJ SOLN
INTRAMUSCULAR | Status: AC
  Filled 2016-04-12: qty 10

## 2016-04-12 MED ORDER — METOPROLOL TARTRATE 5 MG/5ML IV SOLN
INTRAVENOUS | Status: AC
  Filled 2016-04-12: qty 5

## 2016-04-12 MED ORDER — ONDANSETRON HCL 4 MG/2ML IJ SOLN
INTRAMUSCULAR | Status: AC
  Filled 2016-04-12: qty 2

## 2016-04-12 MED ORDER — STERILE WATER FOR IRRIGATION IR SOLN
Status: DC | PRN
  Administered 2016-04-12: 2.5 mL

## 2016-04-12 MED ORDER — MEPERIDINE HCL 100 MG/ML IJ SOLN
INTRAMUSCULAR | Status: AC
  Filled 2016-04-12: qty 2

## 2016-04-12 MED ORDER — MEPERIDINE HCL 50 MG/ML IJ SOLN
INTRAMUSCULAR | Status: DC | PRN
  Administered 2016-04-12 (×2): 25 mg via INTRAVENOUS

## 2016-04-12 MED ORDER — MIDAZOLAM HCL 5 MG/5ML IJ SOLN
INTRAMUSCULAR | Status: DC | PRN
  Administered 2016-04-12 (×2): 2 mg via INTRAVENOUS

## 2016-04-12 MED ORDER — FENTANYL CITRATE (PF) 100 MCG/2ML IJ SOLN
INTRAMUSCULAR | Status: AC
  Filled 2016-04-12: qty 2

## 2016-04-12 MED ORDER — PROMETHAZINE HCL 25 MG/ML IJ SOLN
INTRAMUSCULAR | Status: AC
  Filled 2016-04-12: qty 1

## 2016-04-12 MED ORDER — MEPERIDINE HCL 50 MG/ML IJ SOLN
INTRAMUSCULAR | Status: AC
  Filled 2016-04-12: qty 1

## 2016-04-12 MED ORDER — FENTANYL CITRATE (PF) 100 MCG/2ML IJ SOLN
INTRAMUSCULAR | Status: DC | PRN
  Administered 2016-04-12: 50 ug via INTRAVENOUS

## 2016-04-12 MED ORDER — SODIUM CHLORIDE 0.9 % IV SOLN
INTRAVENOUS | Status: DC
  Administered 2016-04-12: 10:00:00 via INTRAVENOUS

## 2016-04-12 MED ORDER — PROMETHAZINE HCL 25 MG/ML IJ SOLN
12.5000 mg | Freq: Once | INTRAMUSCULAR | Status: AC
  Administered 2016-04-12: 12.5 mg via INTRAVENOUS

## 2016-04-12 MED ORDER — SODIUM CHLORIDE 0.9 % IV SOLN
Freq: Once | INTRAVENOUS | Status: AC
  Administered 2016-04-12: 20:00:00 via INTRAVENOUS

## 2016-04-12 MED ORDER — SODIUM CHLORIDE 0.9% FLUSH
INTRAVENOUS | Status: AC
  Filled 2016-04-12: qty 10

## 2016-04-12 MED ORDER — SODIUM CHLORIDE 0.9 % IV SOLN: INTRAVENOUS | Status: DC

## 2016-04-12 MED ORDER — MIDAZOLAM HCL 5 MG/5ML IJ SOLN
INTRAMUSCULAR | Status: DC | PRN
  Administered 2016-04-12: 2 mg via INTRAVENOUS

## 2016-04-12 NOTE — Telephone Encounter (Signed)
Patient,s wife called to notify that her husband Troy Petty passed blood per rectum and he was having abdominal discomfort. Patient underwent colonoscopy with snare polypectomy by [facility] earlier today. I did talk with patient and he denied postural symptoms. Patient advised to come to emergency room as soon as possible. ER physician notified.

## 2016-04-12 NOTE — ED Triage Notes (Signed)
Pt had a colonoscopy today and was discharged around noon; pt noticed this evening when he went to the bathroom he had some bright red blood with his BM and some abdominal pain

## 2016-04-12 NOTE — Op Note (Signed)
[facility] Patient Name: Troy Petty Procedure Date: 04/12/2016 10:21 PM MRN: [id number] Date of Birth: 1950/07/09 Attending MD: Hildred Laser , MD CSN: [id number] Age: 65 Admit Type: Outpatient Procedure:                Colonoscopy Indications:              Treatment of bleeding from polypectomy site Providers:                Hildred Laser, MD, Manus Rudd, MD, Lurline Del,                            RN, [facility], Technician Referring MD:              Medicines:                Meperidine 50 mg IV, Midazolam 4 mg IV, metoprolol                            2.5 mg IV Complications:            No immediate complications. Estimated Blood Loss:     Estimated blood loss: none. Procedure:                Pre-Anesthesia Assessment:                           - Prior to the procedure, a History and Physical                            was performed, and patient medications and                            allergies were reviewed. The patient's tolerance of                            previous anesthesia was also reviewed. The risks                            and benefits of the procedure and the sedation                            options and risks were discussed with the patient.                            All questions were answered, and informed consent                            was obtained. Prior Anticoagulants: The patient has                            taken no previous anticoagulant or antiplatelet                            agents. ASA Grade Assessment: II - A patient with  mild systemic disease. After reviewing the risks                            and benefits, the patient was deemed in                            satisfactory condition to undergo the procedure.                           - Prior to the procedure, a History and Physical                            was performed, and patient medications and                            allergies were  reviewed. The patient's tolerance of                            previous anesthesia was also reviewed. The risks                            and benefits of the procedure and the sedation                            options and risks were discussed with the patient.                            All questions were answered, and informed consent                            was obtained. Prior Anticoagulants: The patient                            last took aspirin 3 days prior to the procedure.                            ASA Grade Assessment: III - A patient with severe                            systemic disease. After reviewing the risks and                            benefits, the patient was deemed in satisfactory                            condition to undergo the procedure.                           After obtaining informed consent, the colonoscope                            was passed under direct vision. Throughout the  procedure, the patient's blood pressure, pulse, and                            oxygen saturations were monitored continuously. The                            [id number]) scope was introduced through                            the and advanced to the the ileocolonic                            anastomosis. The colonoscopy was performed without                            difficulty. The patient tolerated the procedure                            well. The quality of the bowel preparation was                            {skip}blood noted throughout the colon. The                            terminal ileum and the rectum were photographed. Scope In: 10:50:22 PM Scope Out: 11:16:30 PM Scope Withdrawal Time: 0 hours 23 minutes 27 seconds  Total Procedure Duration: 0 hours 26 minutes 8 seconds  Findings:      There was evidence of a patent end-to-end ileo-colonic anastomosis found       in the region of hepatic flexure. 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 This was       easily traversed.      Red blood was found in the proximal transverse colon.      Clots and fresh blood was seen throughout the colon, secondary to       previous polypectomy procedure at proximal transverse colon. To stop       active bleeding, three hemostatic clips were successfully placed (MR       conditional). There was no bleeding at the end of the procedure.      No other abnormalities noted although examination of mucosa was limited       because of blood. Impression:               - Patent end-to-end ileo-colonic anastomosisocated                            in the region of hepatic flexure.                           - Noncritical stricture noted proximal to                            ileocolonic anamosis characterized by linear  ulceration covered with small clot but no active                            bleeding noted on washing.                           - Blood and clots noted throughout the colon.                           - Bleeding(oozing) in the transverse colon                            secondary to previous polypectomy. Clips (MR                            conditional) were placed.                           - No specimens collected. Moderate Sedation:      Moderate (conscious) sedation was administered by the endoscopy nurse       and supervised by the endoscopist. The following parameters were       monitored: oxygen saturation, heart rate, blood pressure, CO2       capnography and response to care. Total physician intraservice time was       32 minutes. Recommendation:           - Admit the patient to hospital ward because of                            fever.                           - Clear liquid diet.                           - No aspirin, ibuprofen, naproxen, or other                            non-steroidal anti-inflammatory drugs for 7  days.                           - Repeat colonoscopy date to be determined after                            pending pathology results are reviewed for                            surveillance.                           - Return patient to hospital ward for observation. Procedure Code(s):        --- Professional ---                           [phone], Colonoscopy, flexible; with control of  bleeding, any method                           99152, Moderate sedation services provided by the                            same physician or other qualified health care                            professional performing the diagnostic or                            therapeutic service that the sedation supports,                            requiring the presence of an independent trained                            observer to assist in the monitoring of the                            patient's level of consciousness and physiological                            status; initial 15 minutes of intraservice time,                            patient age 13 years or older                           [phone], Moderate sedation services; each additional                            15 minutes intraservice time Diagnosis Code(s):        --- Professional ---                           Z98.0, Intestinal bypass and anastomosis status                           K92.2, Gastrointestinal hemorrhage, unspecified                           K91.840, Postprocedural hemorrhage of a digestive                            system organ or structure following a digestive                            system procedure CPT copyright 2016 American Medical [employer]. All rights reserved. The codes documented in this report are preliminary and upon coder review may  be revised to meet current compliance requirements. Hildred Laser, MD Hildred Laser, MD 04/12/2016 11:55:09 PM This report has been signed  electronically. Norvel Richards, MD Number of Addenda: 0

## 2016-04-12 NOTE — ED Provider Notes (Signed)
[facility] DEPT Provider Note   CSN: [id number] Arrival date & time: 04/12/16  1928 By signing my name below, I, Georgette Shell, attest that this documentation has been prepared under the direction and in the presence of Milton Ferguson, MD. Electronically Signed: Georgette Shell, ED Scribe. 04/12/16. 8:17 PM.  History   Chief Complaint Chief Complaint  Patient presents with  . Rectal Bleeding   HPI Comments: Troy Petty is a 65 y.o. male with h/o CKD, CAD, hemorrhoids, and HTN who presents to the Emergency Department complaining of rectal bleeding onset this evening with associated mild abdominal pain. Pt had colonoscopy today and was discharged around noon. Pt noticed this evening when he went to the bathroom he had some bright red blood with his bowel movement. He has not tried any OTC medications PTA. Denies h/o similar symptoms. He denies fever, chills, or any other associated symptoms.  The history is provided by the patient. No language interpreter was used.  Rectal Bleeding  Quality:  Bright red Amount:  Moderate Duration:  3 hours Timing:  Sporadic Chronicity:  New Context: spontaneously   Similar prior episodes: no   Relieved by:  None tried Worsened by:  Nothing Ineffective treatments:  None tried Associated symptoms: abdominal pain   Abdominal pain:    Location:  LLQ   Severity:  Mild   Onset quality:  Gradual   Duration:  3 hours   Timing:  Constant   Progression:  Unchanged   Chronicity:  New Risk factors: hx of colorectal surgery    Past Medical History:  Diagnosis Date  . Arthritis    MIDDLE FINGER AND RING FINGER BOTH HANDS  . Chronic kidney disease, stage III (moderate)   . Chronic renal insufficiency    baseline creatinine is 1.8  . Chronic systolic dysfunction of left ventricle 4/15   EF 40-45% in setting of PMT on limited echo  . Complete heart block ([place])   . COPD (chronic obstructive pulmonary disease) ([place])   . Coronary artery disease    NON  OBSTRUCTIVE CAD; DR. Harl Bowie WITH [facility] IS PT'S CARDIOLOGIST  . GERD (gastroesophageal reflux disease)   . Hemorrhoids   . Hypertension   . Hypertrophy of prostate with urinary obstruction and other lower urinary tract symptoms (LUTS)    INDWELLING FOLEY CATHETER  . Metabolic syndrome   . Mild pulmonary hypertension   . Pacemaker    IMPLANTED Mar 14, 2013 - DR. ALLRED WITH [facility] FOLLOWS DEVICE FUNCTIONING  . Pain    BACK PAIN AT TIMES  . Shortness of breath    WITH EXERTION  . Vitamin B12 deficiency     Patient Active Problem List   Diagnosis Date Noted  . Pyelonephritis 01/16/2014  . UTI (urinary tract infection) due to urinary indwelling Foley catheter ([place]) 01/16/2014  . Sepsis secondary to UTI ([place]) 01/16/2014  . CKD (chronic kidney disease), stage III 01/16/2014  . Acute on chronic renal failure ([place]) 01/01/2014  . Acute renal failure ([place]) 01/01/2014  . Hypertension 01/01/2014  . Anemia 01/01/2014  . Hypotension 08/21/2013  . Complete heart block ([place]) 08/21/2013  . GERD (gastroesophageal reflux disease) 08/21/2013  . Urinary retention 08/21/2013  . Elevated brain natriuretic peptide (BNP) level 08/21/2013  . Acute kidney injury ([place]) 08/21/2013  . Heart failure with reduced ejection fraction ([place]) 08/21/2013    Past Surgical History:  Procedure Laterality Date  . CARDIAC CATHETERIZATION  2014  . COLON SURGERY     COLON  RESECTION FOR BLOCKAGE  . GREEN LIGHT LASER TURP (TRANSURETHRAL RESECTION OF PROSTATE N/A 02/13/2014   Procedure: GREEN LIGHT LASER TURP (TRANSURETHRAL RESECTION OF PROSTATE;  Surgeon: Festus Aloe, MD;  Location: [facility] ORS;  Service: Urology;  Laterality: N/A;  . OTHER SURGICAL HISTORY     Intestinal blockage x2  . OTHER SURGICAL HISTORY  1964   Football injury  . PACEMAKER INSERTION  03/14/13   SJM Endurity implanted by Dr Truman Hayward in [place] for complete heart block       Home Medications    Prior to Admission  medications   Medication Sig Start Date End Date Taking? Authorizing Provider  amLODipine (NORVASC) 10 MG tablet TAKE 1 TABLET (10 MG TOTAL) BY MOUTH DAILY. 12/13/15   Arnoldo Lenis, MD  aspirin EC 81 MG tablet Take 81 mg by mouth every morning.    Historical Provider, MD  metoprolol succinate (TOPROL-XL) 25 MG 24 hr tablet Take 0.5 tablets (12.5 mg total) by mouth daily. 11/12/15   Thompson Grayer, MD  omeprazole (PRILOSEC) 40 MG capsule Take 1 capsule by mouth daily as needed.  04/01/14   Historical Provider, MD    Family History Family History  Problem Relation Age of Onset  . Cancer Father   . Colon cancer Father     Social History Social History  Substance Use Topics  . Smoking status: Former Smoker    Packs/day: 1.00    Years: 45.00    Types: Cigarettes    Start date: 09/29/1966    Quit date: 05/02/2011  . Smokeless tobacco: Never Used  . Alcohol use No     Allergies   Patient has no known allergies.   Review of Systems Review of Systems  Constitutional: Negative for appetite change and fatigue.  HENT: Negative for congestion, ear discharge and sinus pressure.   Eyes: Negative for discharge.  Respiratory: Negative for cough.   Cardiovascular: Negative for chest pain.  Gastrointestinal: Positive for abdominal pain, blood in stool and hematochezia.  Musculoskeletal: Negative for back pain.  Skin: Negative for rash.  Neurological: Negative for seizures.  Psychiatric/Behavioral: Negative for hallucinations.   Physical Exam Updated Vital Signs BP 137/78 (BP Location: Left Arm)   Pulse (!) 55   Temp 98.9 F (37.2 C) (Oral)   Resp 19   Ht 5\' 11"  (1.803 m)   Wt 195 lb (88.5 kg)   SpO2 100%   BMI 27.20 kg/m   Physical Exam  Constitutional: He is oriented to person, place, and time. He appears well-developed.  HENT:  Head: Normocephalic.  Eyes: Conjunctivae and EOM are normal. No scleral icterus.  Neck: Neck supple. No thyromegaly present.  Cardiovascular:  Normal rate and regular rhythm.  Exam reveals no gallop and no friction rub.   No murmur heard. Pulmonary/Chest: No stridor. He has no wheezes. He has no rales. He exhibits no tenderness.  Abdominal: Soft. He exhibits no distension. There is tenderness. There is no rebound.  Minor LLQ tenderness.  Musculoskeletal: Normal range of motion. He exhibits no edema.  Lymphadenopathy:    He has no cervical adenopathy.  Neurological: He is oriented to person, place, and time. He exhibits normal muscle tone. Coordination normal.  Skin: No rash noted. No erythema.  Psychiatric: He has a normal mood and affect. His behavior is normal.     ED Treatments / Results  DIAGNOSTIC STUDIES: Oxygen Saturation is 100% on RA, normal by my interpretation.    COORDINATION OF CARE: 8:10 PM Discussed treatment plan  with pt at bedside and pt agreed to plan.  Labs (all labs ordered are listed, but only abnormal results are displayed) Labs Reviewed  I-STAT CHEM 8, ED    EKG  EKG Interpretation None       Radiology No results found.  Procedures Procedures (including critical care time)  Medications Ordered in ED Medications  0.9 %  sodium chloride infusion (not administered)     Initial Impression / Assessment and Plan / ED Course  I have reviewed the triage vital signs and the nursing notes.  Pertinent labs & imaging results that were available during my care of the patient were reviewed by me and considered in my medical decision making (see chart for details).  Clinical Course     Patient with lower GI bleed after colonoscopy with biopsies today. Patient was seen by GI doctor in emergency department and it was decided to take him to do a colonoscopy now to see what her bleeding is  Final Clinical Impressions(s) / ED Diagnoses   Final diagnoses:  None    New Prescriptions New Prescriptions   No medications on file   The chart was scribed for me under my direct supervision.  I  personally performed the history, physical, and medical decision making and all procedures in the evaluation of this patient.Milton Ferguson, MD 04/12/16 [phone]

## 2016-04-12 NOTE — Discharge Instructions (Addendum)
°Colonoscopy °Discharge Instructions ° °Read the instructions outlined below and refer to this sheet in the next few weeks. These discharge instructions provide you with general information on caring for yourself after you leave the hospital. Your doctor may also give you specific instructions. While your treatment has been planned according to the most current medical practices available, unavoidable complications occasionally occur. If you have any problems or questions after discharge, call Dr. Rourk at [phone]. °ACTIVITY °· You may resume your regular activity, but move at a slower pace for the next 24 hours.  °· Take frequent rest periods for the next 24 hours.  °· Walking will help get rid of the air and reduce the bloated feeling in your belly (abdomen).  °· No driving for 24 hours (because of the medicine (anesthesia) used during the test).   °· Do not sign any important legal documents or operate any machinery for 24 hours (because of the anesthesia used during the test).  °NUTRITION °· Drink plenty of fluids.  °· You may resume your normal diet as instructed by your doctor.  °· Begin with a light meal and progress to your normal diet. Heavy or fried foods are harder to digest and may make you feel sick to your stomach (nauseated).  °· Avoid alcoholic beverages for 24 hours or as instructed.  °MEDICATIONS °· You may resume your normal medications unless your doctor tells you otherwise.  °WHAT YOU CAN EXPECT TODAY °· Some feelings of bloating in the abdomen.  °· Passage of more gas than usual.  °· Spotting of blood in your stool or on the toilet paper.  °IF YOU HAD POLYPS REMOVED DURING THE COLONOSCOPY: °· No aspirin products for 7 days or as instructed.  °· No alcohol for 7 days or as instructed.  °· Eat a soft diet for the next 24 hours.  °FINDING OUT THE RESULTS OF YOUR TEST °Not all test results are available during your visit. If your test results are not back during the visit, make an appointment  with your caregiver to find out the results. Do not assume everything is normal if you have not heard from your caregiver or the medical facility. It is important for you to follow up on all of your test results.  °SEEK IMMEDIATE MEDICAL ATTENTION IF: °· You have more than a spotting of blood in your stool.  °· Your belly is swollen (abdominal distention).  °· You are nauseated or vomiting.  °· You have a temperature over 101.  °· You have abdominal pain or discomfort that is severe or gets worse throughout the day.  ° ° °Polyp information provided ° °Further recommendations to follow pending review of pathology report ° ° °Colon Polyps °Introduction °Polyps are tissue growths inside the body. Polyps can grow in many places, including the large intestine (colon). A polyp may be a round bump or a mushroom-shaped growth. You could have one polyp or several. °Most colon polyps are noncancerous (benign). However, some colon polyps can become cancerous over time. °What are the causes? °The exact cause of colon polyps is not known. °What increases the risk? °This condition is more likely to develop in people who: °· Have a family history of colon cancer or colon polyps. °· Are older than 50 or older than 45 if they are African American. °· Have inflammatory bowel disease, such as ulcerative colitis or Crohn disease. °· Are overweight. °· Smoke cigarettes. °· Do not get enough exercise. °· Drink too much alcohol. °·   Eat a diet that is: °¨ High in fat and red meat. °¨ Low in fiber. °· Had childhood cancer that was treated with abdominal radiation. °What are the signs or symptoms? °Most polyps do not cause symptoms. If you have symptoms, they may include: °· Blood coming from your rectum when having a bowel movement. °· Blood in your stool. The stool may look dark red or black. °· A change in bowel habits, such as constipation or diarrhea. °How is this diagnosed? °This condition is diagnosed with a colonoscopy. This is a  procedure that uses a lighted, flexible scope to look at the inside of your colon. °How is this treated? °Treatment for this condition involves removing any polyps that are found. Those polyps will then be tested for cancer. If cancer is found, your health care provider will talk to you about options for colon cancer treatment. °Follow these instructions at home: °Diet °· Eat plenty of fiber, such as fruits, vegetables, and whole grains. °· Eat foods that are high in calcium and vitamin D, such as milk, cheese, yogurt, eggs, liver, fish, and broccoli. °· Limit foods high in fat, red meats, and processed meats, such as hot dogs, sausage, bacon, and lunch meats. °· Maintain a healthy weight, or lose weight if recommended by your health care provider. °General instructions °· Do not smoke cigarettes. °· Do not drink alcohol excessively. °· Keep all follow-up visits as told by your health care provider. This is important. This includes keeping regularly scheduled colonoscopies. Talk to your health care provider about when you need a colonoscopy. °· Exercise every day or as told by your health care provider. °Contact a health care provider if: °· You have new or worsening bleeding during a bowel movement. °· You have new or increased blood in your stool. °· You have a change in bowel habits. °· You unexpectedly lose weight. °This information is not intended to replace advice given to you by your health care provider. Make sure you discuss any questions you have with your health care provider. °Document Released: 01/12/2004 Document Revised: 09/23/2015 Document Reviewed: 03/08/2015 °© 2017 Elsevier ° °

## 2016-04-12 NOTE — Op Note (Addendum)
[facility] Patient Name: Troy Petty Procedure Date: 04/12/2016 10:03 AM MRN: [id number] Date of Birth: Jun 17, 1950 Attending MD: Norvel Richards , MD CSN: [id number] Age: 65 Admit Type: Outpatient Procedure:                Ileo-colonoscopy with biopsy and multiple snare                            polypectomies Indications:              Screening in patient at increased risk: Family                            history of 1st-degree relative with colorectal                            cancer Providers:                Norvel Richards, MD, Charlyne Petrin RN, RN,                            [employer]. [place], [employer] Referring MD:              Medicines:                Midazolam 2 mg IV, Fentanyl 50 micrograms IV,                            Promethazine [id number] mg IV Complications:            No immediate complications. Estimated Blood Loss:     Estimated blood loss was minimal. Procedure:                Pre-Anesthesia Assessment:                           - Prior to the procedure, a History and Physical                            was performed, and patient medications and                            allergies were reviewed. The patient's tolerance of                            previous anesthesia was also reviewed. The risks                            and benefits of the procedure and the sedation                            options and risks were discussed with the patient.                            All questions were answered, and informed consent  was obtained. Prior Anticoagulants: The patient has                            taken no previous anticoagulant or antiplatelet                            agents. ASA Grade Assessment: II - A patient with                            mild systemic disease. After reviewing the risks                            and benefits, the patient was deemed in                            satisfactory condition to  undergo the procedure.                           After obtaining informed consent, the colonoscope                            was passed under direct vision. Throughout the                            procedure, the patient's blood pressure, pulse, and                            oxygen saturations were monitored continuously. The                            [id number] scope was introduced through                            the anus and advanced to the the ileocolonic                            anastomosis. Anatomical landmarks were                            photographed. The entire colon was well visualized.                            The colonoscopy was performed without difficulty.                            The patient tolerated the procedure well. The                            quality of the bowel preparation was adequate. Scope In: 10:16:48 AM Scope Out: 10:33:20 AM Scope Withdrawal Time: 0 hours 13 minutes 54 seconds  Total Procedure Duration: 0 hours 16 minutes 32 seconds  Findings:      The perianal and digital rectal examinations were normal.      A 10 mm polyp was found in the  transverse colon. The polyp was       semi-pedunculated. The polyp was removed with a hot snare. Resection and       retrieval were complete. Estimated blood loss: none.      (2) 4 mm polyp was found in the transverse colon. The polyp was       semi-pedunculated. The polyp was removed with a cold snare. Resection       and retrieval were complete. Estimated blood loss was minimal.      Ulcerated mucosa were present at the anastomosis. Biopsies were taken       with a cold forceps for histology. Estimated blood loss was minimal.      The exam was otherwise without abnormality on direct and retroflexion       views. Impression:               - One 10 mm polyp in the transverse colon, removed                            with a hot snare. Resected and retrieved.                           - (2) 4 mm  polyp in the transverse colon, removed                            with a cold snare. Resected and retrieved.                           - Mucosal ulceration at anastomosis. Biopsied.                           - The examination was otherwise normal on direct                            and retroflexion views. Moderate Sedation:      Moderate (conscious) sedation was administered by the endoscopy nurse       and supervised by the endoscopist. The following parameters were       monitored: oxygen saturation, heart rate, blood pressure, respiratory       rate, EKG, adequacy of pulmonary ventilation, and response to care.       Total physician intraservice time was 22 minutes. Recommendation:           - Patient has a contact number available for                            emergencies. The signs and symptoms of potential                            delayed complications were discussed with the                            patient. Return to normal activities tomorrow.                            Written discharge instructions were provided to the  patient.                           - Resume previous diet.                           - Continue present medications.                           - Repeat colonoscopy date to be determined after                            pending pathology results are reviewed for                            surveillance based on pathology results.                           - Return to GI office (date not yet determined). Procedure Code(s):        --- Professional ---                           [phone], Colonoscopy, flexible; with removal of                            tumor(s), polyp(s), or other lesion(s) by snare                            technique                           45380, 59, Colonoscopy, flexible; with biopsy,                            single or multiple                           99152, Moderate sedation services provided by the                             same physician or other qualified health care                            professional performing the diagnostic or                            therapeutic service that the sedation supports,                            requiring the presence of an independent trained                            observer to assist in the monitoring of the                            patient's level of consciousness and physiological  status; initial 15 minutes of intraservice time,                            patient age 101 years or older Diagnosis Code(s):        --- Professional ---                           Z80.0, Family history of malignant neoplasm of                            digestive organs                           D12.3, Benign neoplasm of transverse colon (hepatic                            flexure or splenic flexure)                           K63.3, Ulcer of intestine CPT copyright 2016 [employer]. All rights reserved. The codes documented in this report are preliminary and upon coder review may  be revised to meet current compliance requirements. Cristopher Estimable. Nicklaus Alviar, MD Norvel Richards, MD 04/12/2016 10:45:13 AM This report has been signed electronically. Number of Addenda: 0

## 2016-04-12 NOTE — H&P (Signed)
@LOGO @   Primary Care Physician:  Jacqualine Code, DO Primary Gastroenterologist:  Dr. Gala Romney  Pre-Procedure History & Physical: HPI:  Troy Petty is a 65 y.o. male is here for a screening colonoscopy. No bowel symptoms. Patient relates family history of colon cancer in his father diagnosed at age 85. He further reports negative colonoscopy in [place] some 10 years ago.  Past Medical History:  Diagnosis Date  . Arthritis    MIDDLE FINGER AND RING FINGER BOTH HANDS  . Chronic kidney disease, stage III (moderate)   . Chronic renal insufficiency    baseline creatinine is 1.8  . Chronic systolic dysfunction of left ventricle 4/15   EF 40-45% in setting of PMT on limited echo  . Complete heart block ([place])   . COPD (chronic obstructive pulmonary disease) ([place])   . Coronary artery disease    NON OBSTRUCTIVE CAD; DR. Harl Bowie WITH [facility] IS PT'S CARDIOLOGIST  . GERD (gastroesophageal reflux disease)   . Hemorrhoids   . Hypertension   . Hypertrophy of prostate with urinary obstruction and other lower urinary tract symptoms (LUTS)    INDWELLING FOLEY CATHETER  . Metabolic syndrome   . Mild pulmonary hypertension   . Pacemaker    IMPLANTED Mar 14, 2013 - DR. ALLRED WITH [facility] FOLLOWS DEVICE FUNCTIONING  . Pain    BACK PAIN AT TIMES  . Shortness of breath    WITH EXERTION  . Vitamin B12 deficiency     Past Surgical History:  Procedure Laterality Date  . CARDIAC CATHETERIZATION  2014  . COLON SURGERY     COLON RESECTION FOR BLOCKAGE  . GREEN LIGHT LASER TURP (TRANSURETHRAL RESECTION OF PROSTATE N/A 02/13/2014   Procedure: GREEN LIGHT LASER TURP (TRANSURETHRAL RESECTION OF PROSTATE;  Surgeon: Festus Aloe, MD;  Location: [facility];  Service: Urology;  Laterality: N/A;  . OTHER SURGICAL HISTORY     Intestinal blockage x2  . OTHER SURGICAL HISTORY  1964   Football injury  . PACEMAKER INSERTION  03/14/13   SJM Endurity implanted by Dr Truman Hayward in  [place] for complete heart block    Prior to Admission medications   Medication Sig Start Date End Date Taking? Authorizing Provider  amLODipine (NORVASC) 10 MG tablet TAKE 1 TABLET (10 MG TOTAL) BY MOUTH DAILY. 12/13/15  Yes Arnoldo Lenis, MD  aspirin EC 81 MG tablet Take 81 mg by mouth every morning.   Yes Historical Provider, MD  metoprolol succinate (TOPROL-XL) 25 MG 24 hr tablet Take 0.5 tablets (12.5 mg total) by mouth daily. 11/12/15  Yes Thompson Grayer, MD  omeprazole (PRILOSEC) 40 MG capsule Take 1 capsule by mouth daily as needed.  04/01/14  Yes Historical Provider, MD    Allergies as of 03/30/2016  . (No Known Allergies)    Family History  Problem Relation Age of Onset  . Cancer Father   . Colon cancer Father     Social History   Social History  . Marital status: Married    Spouse name: N/A  . Number of children: N/A  . Years of education: N/A   Occupational History  . Not on file.   Social History Main Topics  . Smoking status: Former Smoker    Packs/day: 1.00    Years: 45.00    Types: Cigarettes    Start date: 09/29/1966    Quit date: 05/02/2011  . Smokeless tobacco: Never Used  . Alcohol use No  . Drug use: No  .  Sexual activity: Not on file   Other Topics Concern  . Not on file   Social History Narrative   Lives in [place] with spouse.    Review of Systems: See HPI, otherwise negative ROS  Physical Exam: BP (!) 158/88   Pulse 76   Temp 97.9 F (36.6 C) (Oral)   Resp 12   Ht 5\' 11"  (1.803 m)   Wt 195 lb (88.5 kg)   SpO2 99%   BMI 27.20 kg/m  General:   Alert,  Well-developed, well-nourished, pleasant and cooperative in NAD Head:  Normocephalic and atraumatic. Lungs:  Clear throughout to auscultation.   No wheezes, crackles, or rhonchi. No acute distress. Heart:  Regular rate and rhythm; no murmurs, clicks, rubs,  or gallops. Abdomen:  Horizontal surgical scar noted. Nondistended. Positive bowel sounds. Soft and nontender  without appreciable mass or organomegaly  Pulses:  Normal pulses noted. Extremities:  Without clubbing or edema.   Impression/Plan: Troy Petty is now here to undergo a screening colonoscopy.  High-risk screening examination. Risks, benefits, limitations, imponderables and alternatives regarding colonoscopy have been reviewed with the patient. Questions have been answered. All parties agreeable.          Notice:  This dictation was prepared with Dragon dictation along with smaller phrase technology. Any transcriptional errors that result from this process are unintentional and may not be corrected upon review.

## 2016-04-12 NOTE — Consult Note (Signed)
Primary Care Physician:  Jacqualine Code, DO Primary Gastroenterologist:  Dr. Gala Romney.  Reason for Consultation:    Post polypectomy rectal bleeding.  HPI:   Patient is 65 year old African-American male who underwent screening colonoscopy by Dr. Gala Romney earlier today. Patient was noted have ulceration at ileocolonic anastomosis(status post ? Right hemicolectomy for sports injury in 1964). Biopsies were taken. He had 10 mm semi-pedunculated polyp hot snared from transverse colon into small polyps were cold snared. Procedure was uneventful. Patient ate lunch on his way home. He went to sleep at home. When he woke up he had to go to the bathroom and passed dark blood. He had 2 more bowel movements when he passed large amount of dark red blood. He also experienced cramping which it was transient. Patient did not experience postural symptoms nausea or vomiting. Patient's wife contacted me and I advised that patient be brought to emergency room for evaluation. He denies abdominal pain nausea or vomiting. As I was finishing my evaluation he had another bloody bowel movement. He passes large amount of burgundy blood. Patient states last dose of aspirin was 2 days ago. He does not take OTC NSAIDs. He has good appetite and his weight has been stable. His only other complaint is one of arthritis involving both hands with some stiffness. He's had symptoms for about 4 years. He has not been formally evaluated for these symptoms.  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 He's been retired for 4 years. He is married and has grown up child. He does not smoke cigarettes and drinks alcohol occasionally. Family history significant for CRC and father who was diagnosed with colon cancer at age 70 and died within 2 years. He lost Brother of brain cancer at age 55. He has one brother living in good health.    Past Medical History:  Diagnosis Date  . Arthritis    MIDDLE FINGER AND RING FINGER BOTH  HANDS  . Chronic kidney disease, stage III (moderate)   . Chronic renal insufficiency    baseline creatinine is 1.8  . Chronic systolic dysfunction of left ventricle 4/15   EF 40-45% in setting of PMT on limited echo  . Complete heart block ([place])   . COPD (chronic obstructive pulmonary disease) ([place])   . Coronary artery disease    NON OBSTRUCTIVE CAD; DR. Harl Bowie WITH [facility] IS PT'S CARDIOLOGIST  . GERD (gastroesophageal reflux disease)   . Hemorrhoids   . Hypertension   . Hypertrophy of prostate with urinary obstruction and other lower urinary tract symptoms (LUTS)          . Mild pulmonary hypertension   . Pacemaker    IMPLANTED Mar 14, 2013 - DR. ALLRED WITH [facility] FOLLOWS DEVICE FUNCTIONING  . Pain      .       Marland Kitchen Vitamin B12 deficiency     Past Surgical History:  Procedure Laterality Date  . CARDIAC CATHETERIZATION  2014  . COLON SURGERY     COLON RESECTION FOR BLOCKAGE  . GREEN LIGHT LASER TURP (TRANSURETHRAL RESECTION OF PROSTATE N/A 02/13/2014   Procedure: GREEN LIGHT LASER TURP (TRANSURETHRAL RESECTION OF PROSTATE;  Surgeon: Festus Aloe, MD;  Location: [facility];  Service: Urology;  Laterality: N/A;  . OTHER SURGICAL HISTORY     Intestinal blockage x2  . OTHER SURGICAL HISTORY  1964   Football injury  . PACEMAKER INSERTION  03/14/13   SJM Endurity implanted by Dr Truman Hayward in [place] for  complete heart block    Prior to Admission medications   Medication Sig Start Date End Date Taking? Authorizing Provider  amLODipine (NORVASC) 10 MG tablet TAKE 1 TABLET (10 MG TOTAL) BY MOUTH DAILY. 12/13/15   Arnoldo Lenis, MD  aspirin EC 81 MG tablet Take 81 mg by mouth every morning.    Historical Provider, MD  metoprolol succinate (TOPROL-XL) 25 MG 24 hr tablet Take 0.5 tablets (12.5 mg total) by mouth daily. 11/12/15   Thompson Grayer, MD  omeprazole (PRILOSEC) 40 MG capsule Take 1 capsule by mouth daily as needed.  04/01/14   Historical Provider, MD     No current facility-administered medications for this encounter.    Current Outpatient Prescriptions  Medication Sig Dispense Refill  . amLODipine (NORVASC) 10 MG tablet TAKE 1 TABLET (10 MG TOTAL) BY MOUTH DAILY. 90 tablet 3  . aspirin EC 81 MG tablet Take 81 mg by mouth every morning.    . metoprolol succinate (TOPROL-XL) 25 MG 24 hr tablet Take 0.5 tablets (12.5 mg total) by mouth daily. 45 tablet 3  . omeprazole (PRILOSEC) 40 MG capsule Take 1 capsule by mouth daily as needed.       Allergies as of 04/12/2016  . (No Known Allergies)     Review of Systems: See HPI, otherwise normal ROS  Physical Exam: Temp:  [97.8 F (36.6 C)-98.9 F (37.2 C)] 98.9 F (37.2 C) (12/13 1944) Pulse Rate:  [55-76] 55 (12/13 1944) Resp:  [12-26] 19 (12/13 1944) BP: (108-167)/(65-98) 137/78 (12/13 1944) SpO2:  [93 %-100 %] 100 % (12/13 1944) Weight:  [195 lb (88.5 kg)] 195 lb (88.5 kg) (12/13 1944)   Patient was seen in emergency room. He is alert and in no acute distress. Conjunctiva is pink and sclerae nonicteric. Oropharyngeal mucosa is normal. No neck masses or thyromegaly noted. Cardiac exam with regular rhythm normal S1 and S2. No murmur or gallop noted. Lungs are clear to auscultation. Abdominal exam reveals long horizontal scar in right low quadrant abdomen. He has small umbilical hernia which is reducible. Bowel sounds are normal. On palpation abdomen is soft with mild tenderness at RLQ. No organomegaly or masses. He has focal Dupuytren,s contracture involving left common. Fingers of both hands appear to be swollen with weak grip in both hands slightly worse on the right side. No peripheral edema or clubbing noted.  Lab Results:  Recent Labs  04/12/16 2026  HGB 18.0*  HCT 53.0*   BMET  Recent Labs  04/12/16 2026  NA 144  K 3.6  CL 107  GLUCOSE 94  BUN 13  CREATININE 1.70*    Assessment;  Patient is 65 year old African-American male who presents with  large-volume painless hematochezia. He status post colonoscopic polypectomy earlier today by Dr.Rourk. He is hemodynamically stable. Hgb of 18 g on i-STAT probably is not correct. Management options reviewed with patient's which include therapeutic colonoscopy tonight versus observation. He is interested in therapeutic colonoscopy. CBC is pending.  Recommendations;  Proceed with therapeutic colonoscopy this evening. If hemostasis is achieved he should be able to go home tonight. Will check INR.    LOS: 0 days   Najeeb Rehman  04/12/2016, 9:24 PM

## 2016-04-13 ENCOUNTER — Inpatient Hospital Stay (HOSPITAL_COMMUNITY): Payer: Medicare Other

## 2016-04-13 DIAGNOSIS — M19042 Primary osteoarthritis, left hand: Secondary | ICD-10-CM | POA: Diagnosis not present

## 2016-04-13 DIAGNOSIS — M19041 Primary osteoarthritis, right hand: Secondary | ICD-10-CM | POA: Diagnosis not present

## 2016-04-13 DIAGNOSIS — E8881 Metabolic syndrome: Secondary | ICD-10-CM | POA: Diagnosis not present

## 2016-04-13 DIAGNOSIS — N183 Chronic kidney disease, stage 3 (moderate): Secondary | ICD-10-CM | POA: Diagnosis not present

## 2016-04-13 DIAGNOSIS — Z800 Family history of malignant neoplasm of digestive organs: Secondary | ICD-10-CM | POA: Diagnosis not present

## 2016-04-13 DIAGNOSIS — J449 Chronic obstructive pulmonary disease, unspecified: Secondary | ICD-10-CM | POA: Diagnosis not present

## 2016-04-13 DIAGNOSIS — K633 Ulcer of intestine: Secondary | ICD-10-CM | POA: Diagnosis not present

## 2016-04-13 DIAGNOSIS — R6510 Systemic inflammatory response syndrome (SIRS) of non-infectious origin without acute organ dysfunction: Secondary | ICD-10-CM | POA: Diagnosis not present

## 2016-04-13 DIAGNOSIS — K649 Unspecified hemorrhoids: Secondary | ICD-10-CM | POA: Diagnosis not present

## 2016-04-13 DIAGNOSIS — E538 Deficiency of other specified B group vitamins: Secondary | ICD-10-CM | POA: Diagnosis not present

## 2016-04-13 DIAGNOSIS — I129 Hypertensive chronic kidney disease with stage 1 through stage 4 chronic kidney disease, or unspecified chronic kidney disease: Secondary | ICD-10-CM | POA: Diagnosis not present

## 2016-04-13 DIAGNOSIS — Z7982 Long term (current) use of aspirin: Secondary | ICD-10-CM | POA: Diagnosis not present

## 2016-04-13 DIAGNOSIS — K921 Melena: Secondary | ICD-10-CM | POA: Diagnosis not present

## 2016-04-13 DIAGNOSIS — K91840 Postprocedural hemorrhage and hematoma of a digestive system organ or structure following a digestive system procedure: Secondary | ICD-10-CM | POA: Diagnosis not present

## 2016-04-13 DIAGNOSIS — K219 Gastro-esophageal reflux disease without esophagitis: Secondary | ICD-10-CM | POA: Diagnosis not present

## 2016-04-13 DIAGNOSIS — I272 Pulmonary hypertension, unspecified: Secondary | ICD-10-CM | POA: Diagnosis not present

## 2016-04-13 DIAGNOSIS — Z950 Presence of cardiac pacemaker: Secondary | ICD-10-CM | POA: Diagnosis not present

## 2016-04-13 DIAGNOSIS — K922 Gastrointestinal hemorrhage, unspecified: Secondary | ICD-10-CM | POA: Diagnosis present

## 2016-04-13 DIAGNOSIS — Y838 Other surgical procedures as the cause of abnormal reaction of the patient, or of later complication, without mention of misadventure at the time of the procedure: Secondary | ICD-10-CM | POA: Diagnosis not present

## 2016-04-13 DIAGNOSIS — I2510 Atherosclerotic heart disease of native coronary artery without angina pectoris: Secondary | ICD-10-CM | POA: Diagnosis not present

## 2016-04-13 LAB — COMPREHENSIVE METABOLIC PANEL
ALT: 16 U/L — ABNORMAL LOW (ref 17–63)
ANION GAP: 5 (ref 5–15)
AST: 15 U/L (ref 15–41)
Albumin: 2.9 g/dL — ABNORMAL LOW (ref 3.5–5.0)
Alkaline Phosphatase: 36 U/L — ABNORMAL LOW (ref 38–126)
BUN: 13 mg/dL (ref 6–20)
CHLORIDE: 111 mmol/L (ref 101–111)
CO2: 23 mmol/L (ref 22–32)
Calcium: 7.9 mg/dL — ABNORMAL LOW (ref 8.9–10.3)
Creatinine, Ser: 1.63 mg/dL — ABNORMAL HIGH (ref 0.61–1.24)
GFR, EST AFRICAN AMERICAN: 49 mL/min — AB (ref >60)
GFR, EST NON AFRICAN AMERICAN: 42 mL/min — AB (ref >60)
Glucose, Bld: 106 mg/dL — ABNORMAL HIGH (ref 65–99)
POTASSIUM: 3.7 mmol/L (ref 3.5–5.1)
SODIUM: 139 mmol/L (ref 135–145)
Total Bilirubin: 0.8 mg/dL (ref 0.3–1.2)
Total Protein: 5.9 g/dL — ABNORMAL LOW (ref 6.5–8.1)

## 2016-04-13 LAB — CBC
HCT: 38.3 % — ABNORMAL LOW (ref 39.0–52.0)
Hemoglobin: 11.8 g/dL — ABNORMAL LOW (ref 13.0–17.0)
MCH: 27.4 pg (ref 26.0–34.0)
MCHC: 30.8 g/dL (ref 30.0–36.0)
MCV: 88.9 fL (ref 78.0–100.0)
PLATELETS: 256 10*3/uL (ref 150–400)
RBC: 4.31 MIL/uL (ref 4.22–5.81)
RDW: 14 % (ref 11.5–15.5)
WBC: 11.8 10*3/uL — ABNORMAL HIGH (ref 4.0–10.5)

## 2016-04-13 LAB — URINALYSIS, ROUTINE W REFLEX MICROSCOPIC
BILIRUBIN URINE: NEGATIVE
GLUCOSE, UA: NEGATIVE mg/dL
Hgb urine dipstick: NEGATIVE
KETONES UR: NEGATIVE mg/dL
Leukocytes, UA: NEGATIVE
Nitrite: NEGATIVE
PH: 5.5 (ref 5.0–8.0)
Protein, ur: 30 mg/dL — AB
SPECIFIC GRAVITY, URINE: 1.025 (ref 1.005–1.030)

## 2016-04-13 LAB — URINALYSIS, MICROSCOPIC (REFLEX)
BACTERIA UA: NONE SEEN
RBC / HPF: NONE SEEN RBC/hpf (ref 0–5)

## 2016-04-13 LAB — LACTIC ACID, PLASMA
LACTIC ACID, VENOUS: 1.3 mmol/L (ref 0.5–1.9)
LACTIC ACID, VENOUS: 1.3 mmol/L (ref 0.5–1.9)

## 2016-04-13 MED ORDER — METOPROLOL SUCCINATE ER 25 MG PO TB24
12.5000 mg | ORAL_TABLET | Freq: Every day | ORAL | Status: DC
  Administered 2016-04-13: 12.5 mg via ORAL
  Filled 2016-04-13: qty 1

## 2016-04-13 MED ORDER — ORAL CARE MOUTH RINSE
15.0000 mL | Freq: Two times a day (BID) | OROMUCOSAL | Status: DC
  Administered 2016-04-13: 15 mL via OROMUCOSAL

## 2016-04-13 MED ORDER — SODIUM CHLORIDE 0.9 % IV SOLN
1500.0000 mg | Freq: Once | INTRAVENOUS | Status: AC
  Administered 2016-04-13: 1500 mg via INTRAVENOUS
  Filled 2016-04-13: qty 1500

## 2016-04-13 MED ORDER — ACETAMINOPHEN 325 MG PO TABS: 650.0000 mg | ORAL_TABLET | Freq: Four times a day (QID) | ORAL | Status: DC | PRN

## 2016-04-13 MED ORDER — VANCOMYCIN HCL IN DEXTROSE 750-5 MG/150ML-% IV SOLN
750.0000 mg | Freq: Once | INTRAVENOUS | Status: AC
  Administered 2016-04-13: 750 mg via INTRAVENOUS
  Filled 2016-04-13: qty 150

## 2016-04-13 MED ORDER — VANCOMYCIN HCL IN DEXTROSE 750-5 MG/150ML-% IV SOLN
750.0000 mg | Freq: Two times a day (BID) | INTRAVENOUS | Status: DC
  Filled 2016-04-13 (×2): qty 150

## 2016-04-13 MED ORDER — ONDANSETRON HCL 4 MG PO TABS
4.0000 mg | ORAL_TABLET | Freq: Four times a day (QID) | ORAL | Status: DC | PRN
Start: 2016-04-13 — End: 2016-04-13

## 2016-04-13 MED ORDER — ACETAMINOPHEN 650 MG RE SUPP: 650.0000 mg | Freq: Four times a day (QID) | RECTAL | Status: DC | PRN

## 2016-04-13 MED ORDER — VANCOMYCIN HCL IN DEXTROSE 750-5 MG/150ML-% IV SOLN
INTRAVENOUS | Status: AC
  Filled 2016-04-13: qty 150

## 2016-04-13 MED ORDER — PIPERACILLIN-TAZOBACTAM 3.375 G IVPB
3.3750 g | Freq: Three times a day (TID) | INTRAVENOUS | Status: DC
  Administered 2016-04-13: 3.375 g via INTRAVENOUS

## 2016-04-13 MED ORDER — ONDANSETRON HCL 4 MG/2ML IJ SOLN: 4.0000 mg | Freq: Four times a day (QID) | INTRAMUSCULAR | Status: DC | PRN

## 2016-04-13 MED ORDER — SODIUM CHLORIDE 0.9 % IV SOLN
INTRAVENOUS | Status: DC
  Administered 2016-04-13: via INTRAVENOUS

## 2016-04-13 MED ORDER — PIPERACILLIN-TAZOBACTAM 3.375 G IVPB
3.3750 g | Freq: Once | INTRAVENOUS | Status: AC
  Administered 2016-04-13: 3.375 g via INTRAVENOUS
  Filled 2016-04-13: qty 50

## 2016-04-13 NOTE — Progress Notes (Signed)
ANTIBIOTIC CONSULT NOTE-Preliminary  Pharmacy Consult for Vancomycin and Zosyn Indication: Sepsis  No Known Allergies  Patient Measurements: Height: 5\' 11"  (180.3 cm) Weight: 195 lb (88.5 kg) IBW/kg (Calculated) : 75.3   Vital Signs: Temp: 99.1 F (37.3 C) (12/13 2345) Temp Source: Oral (12/13 1944) BP: 126/75 (12/13 2355) Pulse Rate: 64 (12/13 2355)  Labs:  Recent Labs  04/12/16 2026 04/12/16 2113  WBC  --  18.1*  HGB 18.0* 15.7  PLT  --  265  CREATININE 1.70*  --     Estimated Creatinine Clearance: 44.9 mL/min (by C-G formula based on SCr of 1.7 mg/dL (H)).  No results for input(s): VANCOTROUGH, VANCOPEAK, VANCORANDOM, GENTTROUGH, GENTPEAK, GENTRANDOM, TOBRATROUGH, TOBRAPEAK, TOBRARND, AMIKACINPEAK, AMIKACINTROU, AMIKACIN in the last 72 hours.   Microbiology: Recent Results (from the past 720 hour(s))  Culture, blood (Routine X 2) w Reflex to ID Panel     Status: None (Preliminary result)   Collection Time: 04/12/16 10:30 PM  Result Value Ref Range Status   Specimen Description BLOOD LEFT FOREARM  Final   Special Requests BOTTLES DRAWN AEROBIC AND ANAEROBIC [facility] EACH  Final   Culture PENDING  Incomplete   Report Status PENDING  Incomplete  Culture, blood (Routine X 2) w Reflex to ID Panel     Status: None (Preliminary result)   Collection Time: 04/12/16 10:35 PM  Result Value Ref Range Status   Specimen Description BLOOD LEFT HAND  Final   Special Requests BOTTLES DRAWN AEROBIC AND ANAEROBIC [facility] EACH  Final   Culture PENDING  Incomplete   Report Status PENDING  Incomplete    Medical History: Past Medical History:  Diagnosis Date  . Arthritis    MIDDLE FINGER AND RING FINGER BOTH HANDS  . Chronic kidney disease, stage III (moderate)   . Chronic renal insufficiency    baseline creatinine is 1.8  . Chronic systolic dysfunction of left ventricle 4/15   EF 40-45% in setting of PMT on limited echo  . Complete heart block ([place])   . COPD (chronic obstructive  pulmonary disease) ([place])   . Coronary artery disease    NON OBSTRUCTIVE CAD; DR. Harl Bowie WITH [facility] IS PT'S CARDIOLOGIST  . GERD (gastroesophageal reflux disease)   . Hemorrhoids   . Hypertension   . Hypertrophy of prostate with urinary obstruction and other lower urinary tract symptoms (LUTS)    INDWELLING FOLEY CATHETER  . Metabolic syndrome   . Mild pulmonary hypertension   . Pacemaker    IMPLANTED Mar 14, 2013 - DR. ALLRED WITH [facility] FOLLOWS DEVICE FUNCTIONING  . Pain    BACK PAIN AT TIMES  . Shortness of breath    WITH EXERTION  . Vitamin B12 deficiency     Medications:   Assessment: 65 yo male seen in the ED s/p screening colonoscopy with polyp removal 04/12/16; pt returned to the ED due   To rectal bleeding; pt now with leukocytosis and increased temperature. Empiric antibiotics for r/o sepsis.  Goal of Therapy:  Vancomycin troughs 15-20 mcg/ml Eradicate infection  Plan:  Preliminary review of pertinent patient information completed.  Protocol will be initiated with one-time doses of Vancomycin 750 mg IV and Zosyn 3.375 Gm IV.  Forestine Na clinical pharmacist will complete review during morning rounds to assess patient and finalize treatment regimen.  Norberto Sorenson, [facility] 04/13/2016,12:23 AM

## 2016-04-13 NOTE — H&P (Signed)
TRH H&P    Patient Demographics:    Troy Petty, is a 65 y.o. male  MRN: [id number]  DOB - [birth year]  Admit Date - 04/12/2016  Referring MD/NP/PA: Dr. Laural Golden  Outpatient Primary MD for the patient is Jacqualine Code, DO  Patient coming from: Endoscopy suite  Chief Complaint  Patient presents with  . Rectal Bleeding      HPI:   Patient is currently in endoscopy suite under sedation so no history is obtainable from patient. History obtained by Dr. Laural Golden.  Troy Petty  is a 65 y.o. male, Who underwent screening colonoscopy by Dr.Rourk this morning at that time was noted to have ulceration at ileocolonic anastomosis status post? Right hemicolectomy for sports injury in 1964. At that time biopsies were taken and polyp was removed. Patient went home and ate lunch on his way home. Patient slept at home and he woke up he went to bathroom and passed dark blood. He had 2 more bowel movements which also had dark red blood. Patient also experienced cramping which was transient. There was no symptoms of nausea vomiting. He came to ED for further evaluation. Patient was seen by GI Dr Laural Golden and underwent another colonoscopy, which showed bleeding at the polypectomy site, clips were placed and bleeding stopped.  Patient was found to have leukocytosis with white count 18,000, and fever with temperature 101.6. Patient also found to be tachycardic with heart rate as high as 120s. Patient will be admitted for observation for SIRS rule out sepsis.    Review of systems:    Review of systems unobtainable as patient heavily sedated after colonoscopy   With Past History of the following :    Past Medical History:  Diagnosis Date  . Arthritis    MIDDLE FINGER AND RING FINGER BOTH HANDS  . Chronic kidney disease, stage III (moderate)   . Chronic renal insufficiency    baseline creatinine is 1.8  .  Chronic systolic dysfunction of left ventricle 4/15   EF 40-45% in setting of PMT on limited echo  . Complete heart block ([place])   . COPD (chronic obstructive pulmonary disease) ([place])   . Coronary artery disease    NON OBSTRUCTIVE CAD; DR. Harl Bowie WITH [facility] IS PT'S CARDIOLOGIST  . GERD (gastroesophageal reflux disease)   . Hemorrhoids   . Hypertension   . Hypertrophy of prostate with urinary obstruction and other lower urinary tract symptoms (LUTS)    INDWELLING FOLEY CATHETER  . Metabolic syndrome   . Mild pulmonary hypertension   . Pacemaker    IMPLANTED Mar 14, 2013 - DR. ALLRED WITH [facility] FOLLOWS DEVICE FUNCTIONING  . Pain    BACK PAIN AT TIMES  . Shortness of breath    WITH EXERTION  . Vitamin B12 deficiency       Past Surgical History:  Procedure Laterality Date  . CARDIAC CATHETERIZATION  2014  . COLON SURGERY     COLON RESECTION FOR BLOCKAGE  . GREEN LIGHT LASER TURP (TRANSURETHRAL RESECTION OF PROSTATE N/A 02/13/2014  Procedure: GREEN LIGHT LASER TURP (TRANSURETHRAL RESECTION OF PROSTATE;  Surgeon: Festus Aloe, MD;  Location: [facility];  Service: Urology;  Laterality: N/A;  . OTHER SURGICAL HISTORY     Intestinal blockage x2  . OTHER SURGICAL HISTORY  1964   Football injury  . PACEMAKER INSERTION  03/14/13   SJM Endurity implanted by Dr Truman Hayward in [place] for complete heart block      Social History:      Social History  Substance Use Topics  . Smoking status: Former Smoker    Packs/day: 1.00    Years: 45.00    Types: Cigarettes    Start date: 09/29/1966    Quit date: 05/02/2011  . Smokeless tobacco: Never Used  . Alcohol use No       Family History :     Family History  Problem Relation Age of Onset  . Cancer Father   . Colon cancer Father       Home Medications:   Prior to Admission medications   Medication Sig Start Date End Date Taking? Authorizing Provider  amLODipine (NORVASC) 10 MG tablet TAKE 1 TABLET (10 MG  TOTAL) BY MOUTH DAILY. 12/13/15  Yes Arnoldo Lenis, MD  metoprolol succinate (TOPROL-XL) 25 MG 24 hr tablet Take 0.5 tablets (12.5 mg total) by mouth daily. 11/12/15  Yes Thompson Grayer, MD  omeprazole (PRILOSEC) 40 MG capsule Take 1 capsule by mouth daily as needed.  04/01/14  Yes Historical Provider, MD  aspirin EC 81 MG tablet Take 81 mg by mouth every morning.    Historical Provider, MD     Allergies:    No Known Allergies   Physical Exam:   Vitals  Blood pressure 126/75, pulse 64, temperature 99.1 F (37.3 C), resp. rate 18, height 5\' 11"  (1.803 m), weight 88.5 kg (195 lb), SpO2 95 %.  1.  General: Sedated   2. Respiratory : Normal respiratory effort, good air movement bilaterally,clear to  auscultation bilaterally  3. Cardiovascular : RRR, no gallops, rubs or murmurs, no leg edema  4. Gastrointestinal:  Positive bowel sounds, abdomen soft, non-tender to palpation,no hepatosplenomegaly, no rigidity or guarding, large scar noted at the right umbilical region       5. Musculo skeletal: No edema of lower extremities noted     Data Review:    CBC  Recent Labs Lab 04/12/16 2026 04/12/16 2113  WBC  --  18.1*  HGB 18.0* 15.7  HCT 53.0* 50.3  PLT  --  265  MCV  --  90.3  MCH  --  28.2  MCHC  --  31.2  RDW  --  14.2  LYMPHSABS  --  1.5  MONOABS  --  1.5*  EOSABS  --  0.1  BASOSABS  --  0.0   ------------------------------------------------------------------------------------------------------------------  Chemistries   Recent Labs Lab 04/12/16 2026  NA 144  K 3.6  CL 107  GLUCOSE 94  BUN 13  CREATININE 1.70*   ------------------------------------------------------------------------------------------------------------------  ------------------------------------------------------------------Coagulation Profile:  Recent Labs Lab 04/12/16 2132  INR 0.94     ------------------------------------------------------------------------------------------   Imaging Results:    No results found.  My personal review of EKG: Rhythm- paced rhythm   Assessment & Plan:    Active Problems:   SIRS (systemic inflammatory response syndrome) (HCC)   1. SIRS- no clear cause of fever with leukocytosis, could be post polypectomy syndrome. Will obtain chest x-ray, UA, urine culture. Blood cultures 2 have been obtained in the Endo suite. Will check lactic  acid. Continue IV normal saline at 125 mL per hour, start empiric vancomycin and Zosyn. Consider CT abdomen and pelvis in a.m. if patient develops abdominal pain. 2. Hematochezia- post polypectomy bleeding, hemostasis obtained after clips placed with repeat colonoscopy. Follow CBC in a.m.    DVT Prophylaxis-    SCDs   AM Labs Ordered, also please review Full Orders  Family Communication: No family at bedside.  Code Status: Full code  Admission status: Observation  Time spent in minutes : 60 minutes   LAMA,GAGAN S M.D on 04/13/2016 at 12:02 AM  Between 7am to 7pm - Pager - [phone]. After 7pm go to [email or web address] - password [facility]  [facility] - Office  [phone]

## 2016-04-13 NOTE — Progress Notes (Signed)
Subjective:  Patient has some fever over night. Feels fine. No body aches, congestion, dysuria, abdominal pain. Feels hungry. Had nonbloody BM last night.   Objective: Vital signs in last 24 hours: Temp:  [97.8 F (36.6 C)-101.6 F (38.7 C)] 99.1 F (37.3 C) (12/14 0035) Pulse Rate:  [48-108] 72 (12/14 0619) Resp:  [12-28] 18 (12/14 0619) BP: (104-167)/(62-107) 109/62 (12/14 0619) SpO2:  [91 %-100 %] 96 % (12/14 0619) Weight:  [195 lb (88.5 kg)] 195 lb (88.5 kg) (12/14 0035)   General:   Alert,  Well-developed, well-nourished, pleasant and cooperative in NAD. Wife at bedside.  Head:  Normocephalic and atraumatic. Eyes:  Sclera clear, no icterus.  Abdomen:  Soft, nontender and nondistended.  Normal bowel sounds, without guarding, and without rebound.   Extremities:  Without clubbing, deformity or edema. Neurologic:  Alert and  oriented x4;  grossly normal neurologically. Skin:  Intact without significant lesions or rashes. Psych:  Alert and cooperative. Normal mood and affect.  Intake/Output from previous day: 12/13 0701 - 12/14 0700 In: 708.3 [I.V.:708.3] Out: -  Intake/Output this shift: No intake/output data recorded.  Lab Results: CBC  Recent Labs  04/12/16 2026 04/12/16 2113 04/13/16 0452  WBC  --  18.1* 11.8*  HGB 18.0* 15.7 11.8*  HCT 53.0* 50.3 38.3*  MCV  --  90.3 88.9  PLT  --  265 256   BMET  Recent Labs  04/12/16 2026 04/13/16 0452  NA 144 139  K 3.6 3.7  CL 107 111  CO2  --  23  GLUCOSE 94 106*  BUN 13 13  CREATININE 1.70* 1.63*  CALCIUM  --  7.9*   LFTs  Recent Labs  04/13/16 0452  BILITOT 0.8  ALKPHOS 36*  AST 15  ALT 16*  PROT 5.9*  ALBUMIN 2.9*   No results for input(s): LIPASE in the last 72 hours. PT/INR  Recent Labs  04/12/16 2132  LABPROT 12.6  INR 0.94      Imaging Studies: Dg Chest Port 1 View  Result Date: 04/13/2016 CLINICAL DATA:  Rectal bleeding after colonoscopy April 12, 2016. Possible sepsis. History  of COPD, hypertension. EXAM: PORTABLE CHEST 1 VIEW COMPARISON:  Chest radiograph January 16, 2014 FINDINGS: Cardiac silhouette is upper limits of normal in size, mediastinal silhouette is nonsuspicious. Increased lung volumes, mild bronchitic changes without pleural effusion or focal consolidation. No pneumothorax. Old RIGHT posterior rib fractures. Dual lead LEFT cardiac pacemaker in situ. Soft tissue planes are nonsuspicious. IMPRESSION: Borderline cardiomegaly.  COPD without acute pulmonary process. Electronically Signed   By: Elon Alas M.D.   On: 04/13/2016 02:23  [2 weeks]   Assessment:  Patient is 65 year old African-American male who presents with large-volume painless hematochezia. He status post colonoscopic polypectomy earlier yesterday. Underwent therapeutic colonoscopy late last night with hemostasis clipping of postpolypectomy site (proximal transverse colon).     Hgb down to 11.8 today. WBC down from 18,100 to 11,800. Currently on zosyn and vancomycin per attending. Patient had fever last night on presentation. Unclear etiology.  No abdominal pain this morning.   Plan: 1. Advance diet.  2. Patient had fever last night, no abdominal pain. No plans for CT imgaing at this time. Discussed with Dr. Gala Romney.  Clinically patient feels well.   Laureen Ochs. Bernarda Caffey [facility] [phone] 12/14/201711:38 AM    LOS: 0 days

## 2016-04-13 NOTE — Telephone Encounter (Signed)
Communication noted.  

## 2016-04-13 NOTE — Progress Notes (Signed)
Pharmacy Antibiotic Note  Troy Petty is a 65 y.o. male admitted on 04/12/2016 with sepsis.  Pharmacy has been consulted for [place] dosing.  Plan:  Vancomycin 1500mg  x 1 then 750mg  IV q12h Check trough at steady state Zosyn 3.375gm IV q8h, EID Monitor labs, renal fxn, progress and c/s Deescalate ABX when improved / appropriate.    Height: 5\' 11"  (180.3 cm) Weight: 195 lb (88.5 kg) IBW/kg (Calculated) : 75.3  Temp (24hrs), Avg:99.1 F (37.3 C), Min:97.8 F (36.6 C), Max:101.6 F (38.7 C)   Recent Labs Lab 04/12/16 2026 04/12/16 2113 04/13/16 0130 04/13/16 0452  WBC  --  18.1*  --  11.8*  CREATININE 1.70*  --   --  1.63*  LATICACIDVEN  --   --  1.3 1.3    Estimated Creatinine Clearance: 46.8 mL/min (by C-G formula based on SCr of 1.63 mg/dL (H)).    No Known Allergies  Antimicrobials this admission: Vancomycin 12/14 >>  Zosyn 12/14 >>   Dose adjustments this admission:  Microbiology results:  BCx: pending  UCx:    Sputum:    MRSA PCR:   Thank you for allowing pharmacy to be a part of this patient's care.  Hart Robinsons A 04/13/2016 7:38 AM

## 2016-04-13 NOTE — Discharge Summary (Signed)
Physician Discharge Summary  Troy Petty [id number] DOB: Aug 30, 1950 DOA: 04/12/2016  PCP: Jacqualine Code, DO  Admit date: 04/12/2016 Discharge date: 04/13/2016  Time spent: 45 minutes  Recommendations for Outpatient Follow-up:  -Will be discharged home today. -Advised to follow up with Dr. Laural Golden in 1 week for Hb check.   Discharge Diagnoses:  Active Problems:   SIRS (systemic inflammatory response syndrome) (HCC)   Lower GI bleed   Discharge Condition: Stable and improved  Filed Weights   04/12/16 1944 04/13/16 0035  Weight: 88.5 kg (195 lb) 88.5 kg (195 lb)    History of present illness:  As per Dr. Darrick Meigs on 12/14: Patient is currently in endoscopy suite under sedation so no history is obtainable from patient. History obtained by Dr. Laural Golden.  Troy Petty  is a 65 y.o. male, Who underwent screening colonoscopy by Dr.Rourk this morning at that time was noted to have ulceration at ileocolonic anastomosis status post? Right hemicolectomy for sports injury in 1964. At that time biopsies were taken and polyp was removed. Patient went home and ate lunch on his way home. Patient slept at home and he woke up he went to bathroom and passed dark blood. He had 2 more bowel movements which also had dark red blood. Patient also experienced cramping which was transient. There was no symptoms of nausea vomiting. He came to ED for further evaluation. Patient was seen by GI Dr Laural Golden and underwent another colonoscopy, which showed bleeding at the polypectomy site, clips were placed and bleeding stopped.  Patient was found to have leukocytosis with white count 18,000, and fever with temperature 101.6. Patient also found to be tachycardic with heart rate as high as 120s. Patient will be admitted for observation for SIRS rule out sepsis.    Hospital Course:   Lower GI Bleed -At recent polypectomy site. -Per GI, ok to Dc home. -Hb stable, no need for  transfusion.  SIRS -No source of infection, source was likely GI from the bleed. -No need for continued abx. -UA/CXR negative.  Procedures:  None   Consultations:  GI  Discharge Instructions  Discharge Instructions    Diet - low sodium heart healthy    Complete by:  As directed    Increase activity slowly    Complete by:  As directed      Allergies as of 04/13/2016   No Known Allergies     Medication List    STOP taking these medications   aspirin EC 81 MG tablet     TAKE these medications   amLODipine 10 MG tablet Commonly known as:  NORVASC TAKE 1 TABLET (10 MG TOTAL) BY MOUTH DAILY.   metoprolol succinate 25 MG 24 hr tablet Commonly known as:  TOPROL-XL Take 0.5 tablets (12.5 mg total) by mouth daily.   omeprazole 40 MG capsule Commonly known as:  PRILOSEC Take 1 capsule by mouth daily as needed.      No Known Allergies Follow-up Information    FAVERO,JOHN PATRICK, DO. Schedule an appointment as soon as possible for a visit in 3 week(s).   Specialty:  Family Medicine Contact information: [address] [place] [address] [phone]        Hildred Laser, MD. Schedule an appointment as soon as possible for a visit in 1 week(s).   Specialty:  Gastroenterology Contact information: [address], [place] [address] [phone]            The results  of significant diagnostics from this hospitalization (including imaging, microbiology, ancillary and laboratory) are listed below for reference.    Significant Diagnostic Studies: Dg Chest Port 1 View  Result Date: 04/13/2016 CLINICAL DATA:  Rectal bleeding after colonoscopy April 12, 2016. Possible sepsis. History of COPD, hypertension. EXAM: PORTABLE CHEST 1 VIEW COMPARISON:  Chest radiograph January 16, 2014 FINDINGS: Cardiac silhouette is upper limits of normal in size, mediastinal silhouette is nonsuspicious. Increased lung volumes, mild bronchitic changes  without pleural effusion or focal consolidation. No pneumothorax. Old RIGHT posterior rib fractures. Dual lead LEFT cardiac pacemaker in situ. Soft tissue planes are nonsuspicious. IMPRESSION: Borderline cardiomegaly.  COPD without acute pulmonary process. Electronically Signed   By: Elon Alas M.D.   On: 04/13/2016 02:23    Microbiology: Recent Results (from the past 240 hour(s))  Culture, blood (Routine X 2) w Reflex to ID Panel     Status: None (Preliminary result)   Collection Time: 04/12/16 10:30 PM  Result Value Ref Range Status   Specimen Description BLOOD LEFT FOREARM  Final   Special Requests BOTTLES DRAWN AEROBIC AND ANAEROBIC 6CC EACH  Final   Culture NO GROWTH < 12 HOURS  Final   Report Status PENDING  Incomplete  Culture, blood (Routine X 2) w Reflex to ID Panel     Status: None (Preliminary result)   Collection Time: 04/12/16 10:35 PM  Result Value Ref Range Status   Specimen Description BLOOD LEFT HAND  Final   Special Requests BOTTLES DRAWN AEROBIC AND ANAEROBIC 6CC EACH  Final   Culture NO GROWTH < 12 HOURS  Final   Report Status PENDING  Incomplete     Labs: Basic Metabolic Panel:  Recent Labs Lab 04/12/16 2026 04/13/16 0452  NA 144 139  K 3.6 3.7  CL 107 111  CO2  --  23  GLUCOSE 94 106*  BUN 13 13  CREATININE 1.70* 1.63*  CALCIUM  --  7.9*   Liver Function Tests:  Recent Labs Lab 04/13/16 0452  AST 15  ALT 16*  ALKPHOS 36*  BILITOT 0.8  PROT 5.9*  ALBUMIN 2.9*   No results for input(s): LIPASE, AMYLASE in the last 168 hours. No results for input(s): AMMONIA in the last 168 hours. CBC:  Recent Labs Lab 04/12/16 2026 04/12/16 2113 04/13/16 0452  WBC  --  18.1* 11.8*  NEUTROABS  --  15.0*  --   HGB 18.0* 15.7 11.8*  HCT 53.0* 50.3 38.3*  MCV  --  90.3 88.9  PLT  --  265 256   Cardiac Enzymes: No results for input(s): CKTOTAL, CKMB, CKMBINDEX, TROPONINI in the last 168 hours. BNP: BNP (last 3 results) No results for input(s):  BNP in the last 8760 hours.  ProBNP (last 3 results) No results for input(s): PROBNP in the last 8760 hours.  CBG: No results for input(s): GLUCAP in the last 168 hours.     SignedLelon Frohlich  [facility] Pager: [phone] 04/13/2016, 5:44 PM

## 2016-04-14 ENCOUNTER — Encounter: Payer: Self-pay | Admitting: Internal Medicine

## 2016-04-14 NOTE — Telephone Encounter (Signed)
Communication noted.  

## 2016-04-15 LAB — URINE CULTURE: Culture: <10000 — AB

## 2016-04-17 ENCOUNTER — Telehealth: Payer: Self-pay | Admitting: Internal Medicine

## 2016-04-17 ENCOUNTER — Encounter (HOSPITAL_COMMUNITY): Payer: Self-pay | Admitting: Internal Medicine

## 2016-04-17 LAB — CULTURE, BLOOD (ROUTINE X 2)
CULTURE: NO GROWTH
Culture: NO GROWTH

## 2016-04-17 NOTE — Telephone Encounter (Signed)
Pt discharge instructions said for him to follow up in 1 week. You can put him with either RMR or an extender.

## 2016-04-17 NOTE — Telephone Encounter (Signed)
Patient's wife called to say that RMR did a colonoscopy on patient and patient ended up being admitted to the hospital She is saying that patient needs to be seen ASAP and NUR told her to follow up with RMR. Please advise if patient needs URG OV or if he can be scheduled with RMR or an extender. [phone]

## 2016-04-18 NOTE — Telephone Encounter (Signed)
Wife is aware of OV on Friday at [facility]

## 2016-04-21 ENCOUNTER — Ambulatory Visit (INDEPENDENT_AMBULATORY_CARE_PROVIDER_SITE_OTHER): Payer: Medicare Other | Admitting: Gastroenterology

## 2016-04-21 ENCOUNTER — Encounter: Payer: Self-pay | Admitting: Gastroenterology

## 2016-04-21 VITALS — BP 132/79 | HR 62 | Temp 98.0°F | Ht 71.0 in | Wt 198.0 lb

## 2016-04-21 DIAGNOSIS — Z86010 Personal history of colonic polyps: Secondary | ICD-10-CM

## 2016-04-21 DIAGNOSIS — I2510 Atherosclerotic heart disease of native coronary artery without angina pectoris: Secondary | ICD-10-CM

## 2016-04-21 LAB — CBC
HEMATOCRIT: 40.7 % (ref 38.5–50.0)
Hemoglobin: 12.8 g/dL — ABNORMAL LOW (ref 13.2–17.1)
MCH: 27 pg (ref 27.0–33.0)
MCHC: 31.4 g/dL — AB (ref 32.0–36.0)
MCV: 85.9 fL (ref 80.0–100.0)
MPV: 9.1 fL (ref 7.5–12.5)
Platelets: 362 10*3/uL (ref 140–400)
RBC: 4.74 MIL/uL (ref 4.20–5.80)
RDW: 14.4 % (ref 11.0–15.0)
WBC: 8.6 10*3/uL (ref 3.8–10.8)

## 2016-04-21 NOTE — Assessment & Plan Note (Signed)
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 Doing quite well now, without any further overt GI bleeding or any other GI issues. Will check CBC now, return as needed, and surveillance colonoscopy due in 2020.

## 2016-04-21 NOTE — Progress Notes (Signed)
CC'D TO PCP °

## 2016-04-21 NOTE — Progress Notes (Signed)
Referring Provider: Jacqualine Code, DO Primary Care Physician:  Jacqualine Code, DO Primary GI: Dr. Gala Romney   Chief Complaint  Patient presents with  . was in hospital    bleeding after TCS, ok now    HPI:   Troy Petty is a 65 y.o. male presenting today with a history of colonoscopy on 04/12/16 with tubulovillous adenomas, (10 mm polyp at transverse colon, two 78mm polyps at transverse). He presented later that evening with a post-polypectomy, undergoing colonoscopy with clips placed transverse colon. Only mild anemia noted during admission and needs repeat CBC today.   No rectal bleeding, constipation, diarrhea, abdominal pain, N/V. Doing quite well. Has no complaints.     Past Medical History:  Diagnosis Date  . Arthritis    MIDDLE FINGER AND RING FINGER BOTH HANDS  . Chronic kidney disease, stage III (moderate)   . Chronic renal insufficiency    baseline creatinine is 1.8  . Chronic systolic dysfunction of left ventricle 4/15   EF 40-45% in setting of PMT on limited echo  . Complete heart block ([place])   . COPD (chronic obstructive pulmonary disease) ([place])   . Coronary artery disease    NON OBSTRUCTIVE CAD; DR. Harl Bowie WITH [facility] IS PT'S CARDIOLOGIST  . GERD (gastroesophageal reflux disease)   . Hemorrhoids   . Hypertension   . Hypertrophy of prostate with urinary obstruction and other lower urinary tract symptoms (LUTS)    INDWELLING FOLEY CATHETER  . Metabolic syndrome   . Mild pulmonary hypertension   . Pacemaker    IMPLANTED Mar 14, 2013 - DR. ALLRED WITH [facility] FOLLOWS DEVICE FUNCTIONING  . Pain    BACK PAIN AT TIMES  . Shortness of breath    WITH EXERTION  . Vitamin B12 deficiency     Past Surgical History:  Procedure Laterality Date  . CARDIAC CATHETERIZATION  2014  . COLON SURGERY     COLON RESECTION FOR BLOCKAGE  . COLONOSCOPY N/A 04/12/2016   Dr. Gala Romney: 10 mm polyp, two 4 mm polyps at transverse, tubulovillous  adenoma, surveillance in 3 years.   . COLONOSCOPY N/A 04/12/2016   Dr. Laural Golden: post-polypectomy bleed s/p clips at transverse colon   . GREEN LIGHT LASER TURP (TRANSURETHRAL RESECTION OF PROSTATE N/A 02/13/2014   Procedure: GREEN LIGHT LASER TURP (TRANSURETHRAL RESECTION OF PROSTATE;  Surgeon: Festus Aloe, MD;  Location: [facility];  Service: Urology;  Laterality: N/A;  . OTHER SURGICAL HISTORY     Intestinal blockage x2  . OTHER SURGICAL HISTORY  1964   Football injury  . PACEMAKER INSERTION  03/14/13   SJM Endurity implanted by Dr Truman Hayward in [place] for complete heart block    Current Outpatient Prescriptions  Medication Sig Dispense Refill  . amLODipine (NORVASC) 10 MG tablet TAKE 1 TABLET (10 MG TOTAL) BY MOUTH DAILY. 90 tablet 3  . metoprolol succinate (TOPROL-XL) 25 MG 24 hr tablet Take 0.5 tablets (12.5 mg total) by mouth daily. 45 tablet 3  . omeprazole (PRILOSEC) 40 MG capsule Take 1 capsule by mouth daily as needed.      No current facility-administered medications for this visit.     Allergies as of 04/21/2016  . (No Known Allergies)    Family History  Problem Relation Age of Onset  . Cancer Father   . Colon cancer Father     Social History   Social History  . Marital status: Married    Spouse name: N/A  . Number  of children: N/A  . Years of education: N/A   Social History Main Topics  . Smoking status: Former Smoker    Packs/day: 1.00    Years: 45.00    Types: Cigarettes    Start date: 09/29/1966    Quit date: 05/02/2011  . Smokeless tobacco: Never Used  . Alcohol use No  . Drug use: No  . Sexual activity: Not Asked   Other Topics Concern  . None   Social History Narrative   Lives in [place] with spouse.    Review of Systems: As mentioned in HPI   Physical Exam: BP 132/79   Pulse 62   Temp 98 F (36.7 C) (Oral)   Ht 5\' 11"  (1.803 m)   Wt 198 lb (89.8 kg)   BMI 27.62 kg/m  General:   Alert and oriented. No distress noted.  Pleasant and cooperative.  Head:  Normocephalic and atraumatic. Eyes:  Conjuctiva clear without scleral icterus. Abdomen:  +BS, soft, non-tender and non-distended. No rebound or guarding. No HSM or masses noted. Msk:  Symmetrical without gross deformities. Normal posture. Extremities:  Without edema. Neurologic:  Alert and  oriented x4;  grossly normal neurologically. Psych:  Alert and cooperative. Normal mood and affect.  Lab Results  Component Value Date   WBC 11.8 (H) 04/13/2016   HGB 11.8 (L) 04/13/2016   HCT 38.3 (L) 04/13/2016   MCV 88.9 04/13/2016   PLT 256 04/13/2016

## 2016-04-21 NOTE — Patient Instructions (Signed)
Please have blood work done today.   We will schedule a colonoscopy in 3 years.  Return as needed

## 2016-04-28 IMAGING — US US RENAL
1 series · 14 of 21 positions shown · non-contrast
Comparison: CT dated 01/02/2014.

CLINICAL DATA: Urinary tract infection. Clinical concern for
pyelonephritis.

EXAM:
RENAL/URINARY TRACT ULTRASOUND COMPLETE

[Series 1: us renal · 0.23mm/px · 14 of 21 slices shown]
[im 1/21]
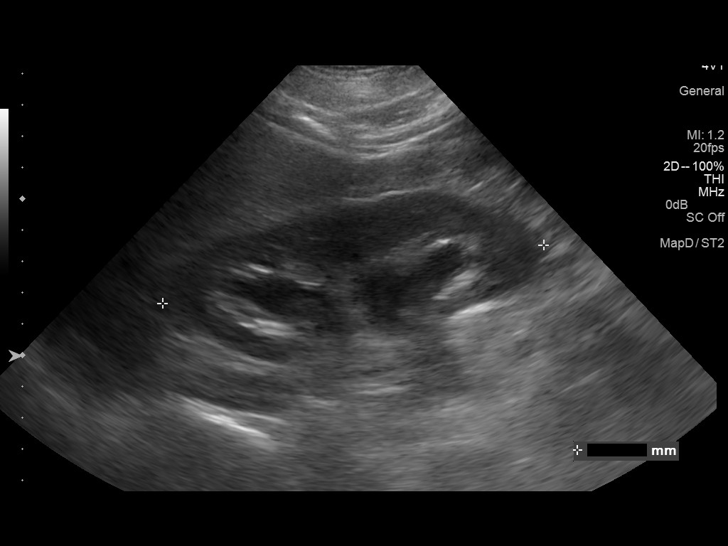
[im 3/21]
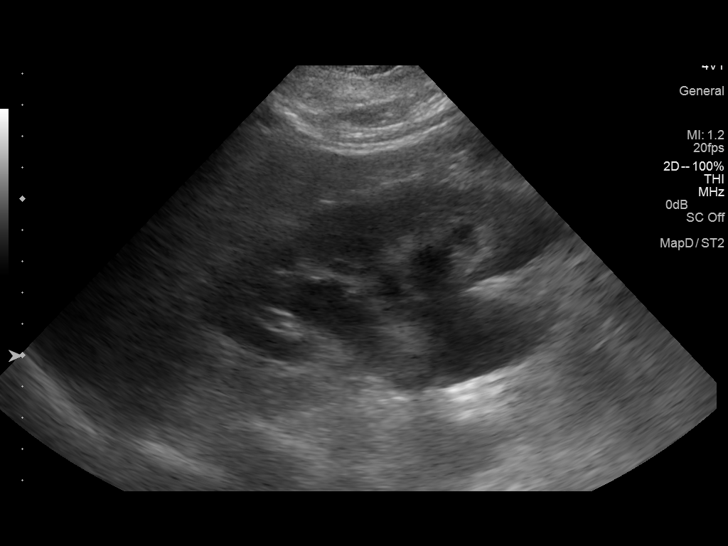
[im 4/21]
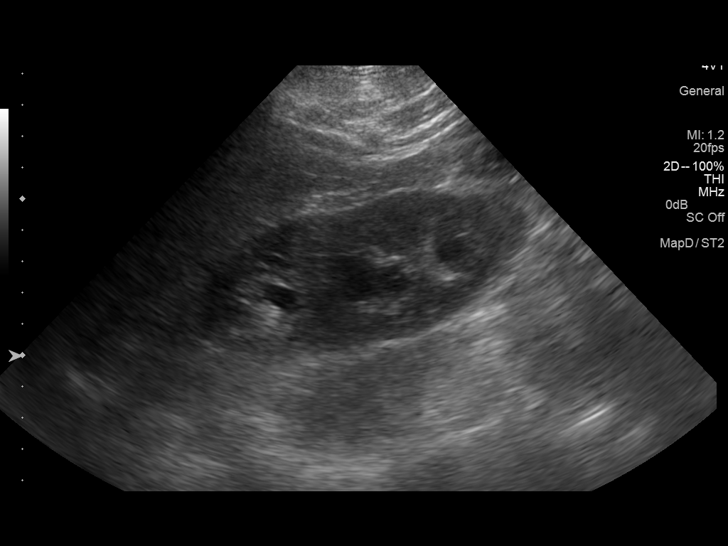
[im 6/21]
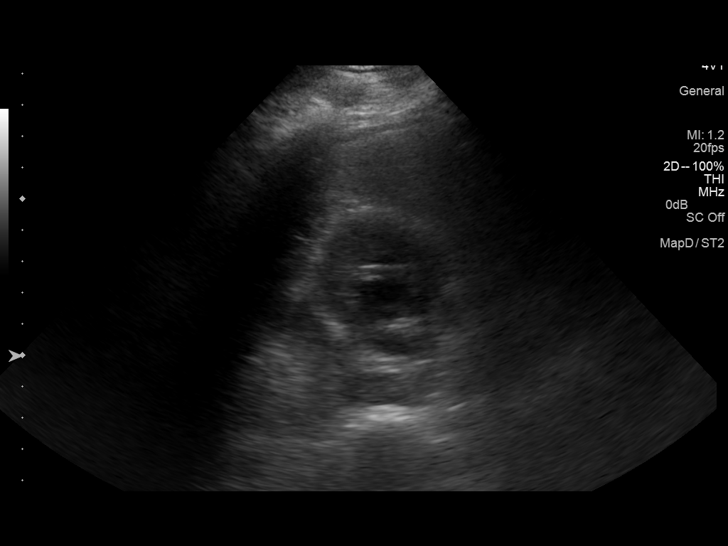
[im 7/21]
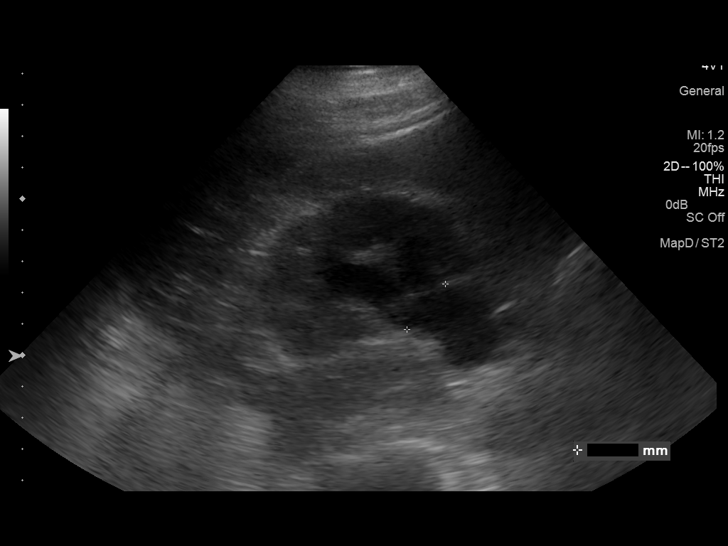
[im 9/21]
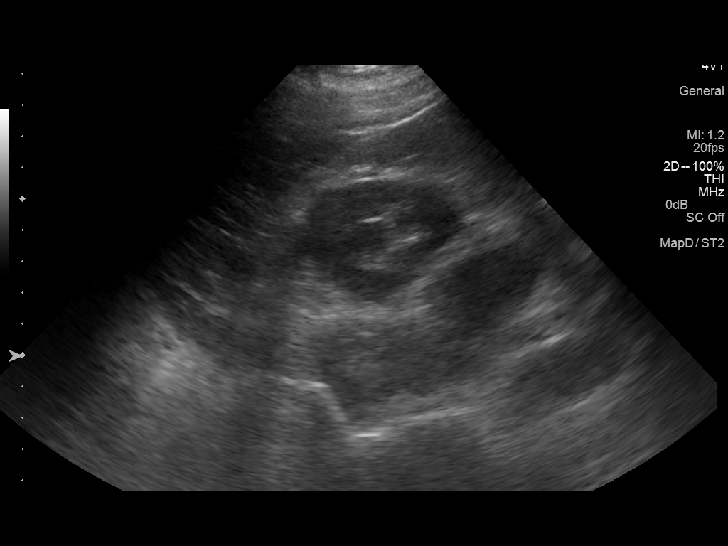
[im 10/21]
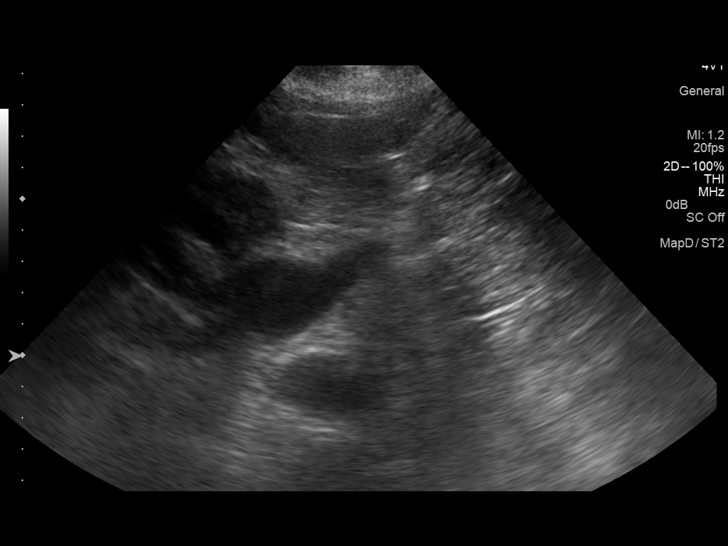
[im 12/21]
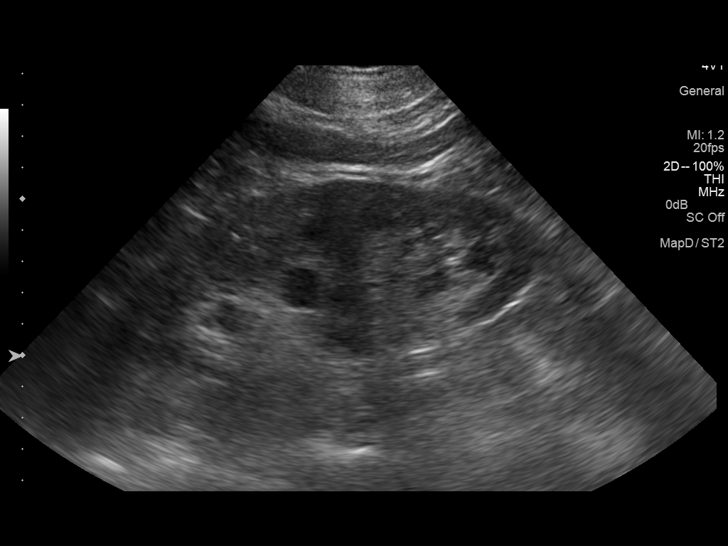
[im 13/21]
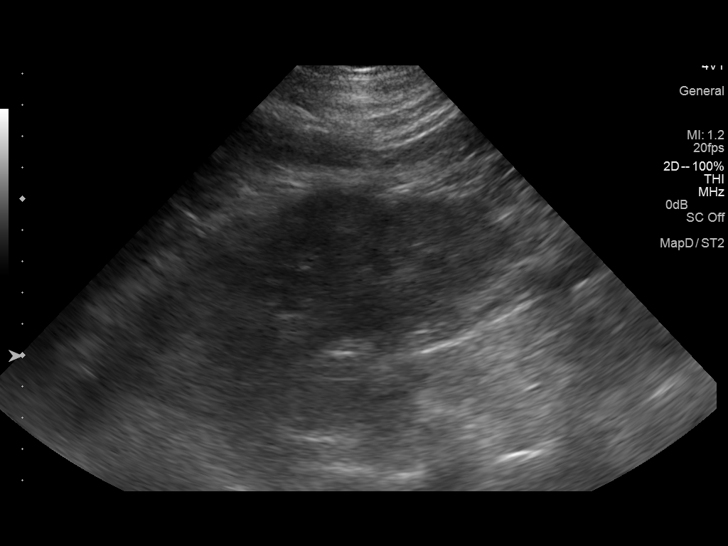
[im 15/21]
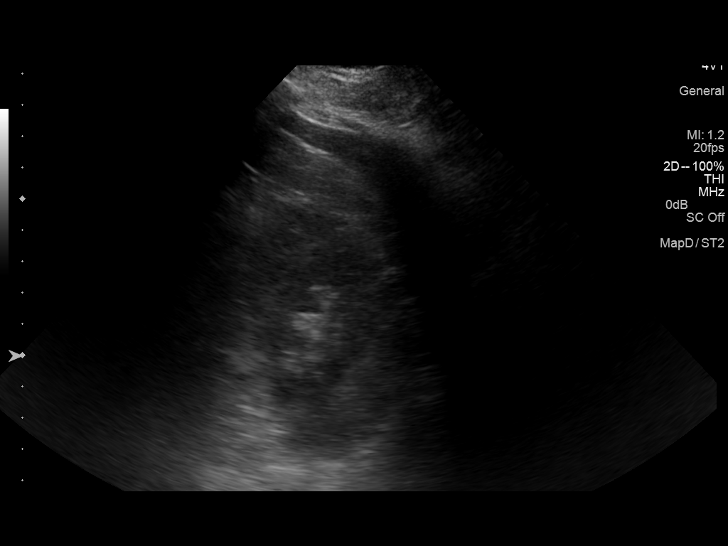
[im 16/21]
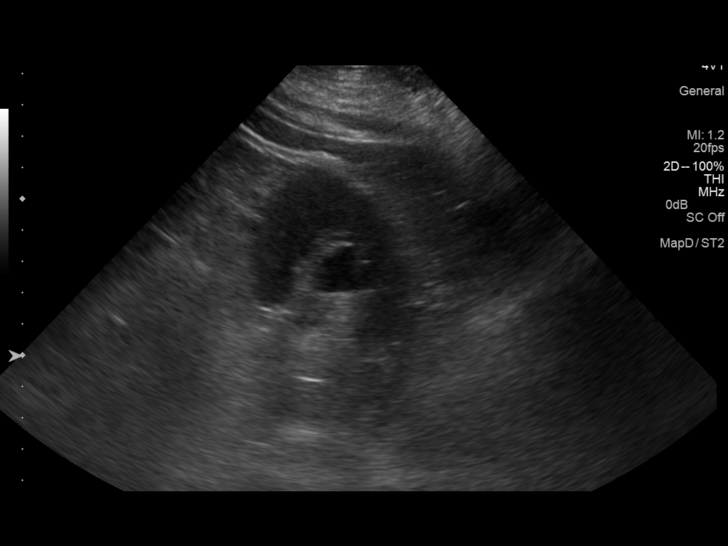
[im 18/21]
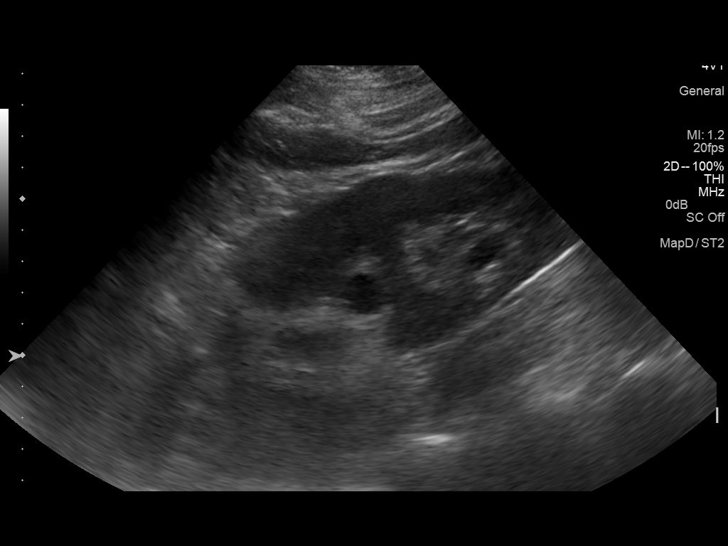
[im 19/21]
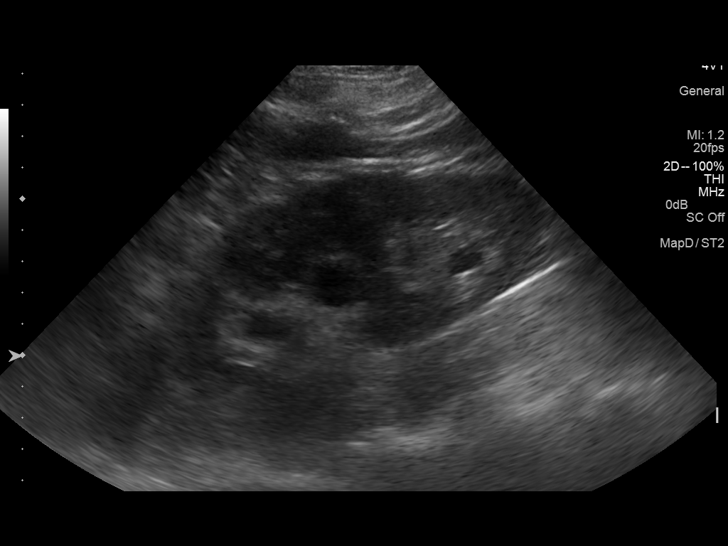
[im 21/21]
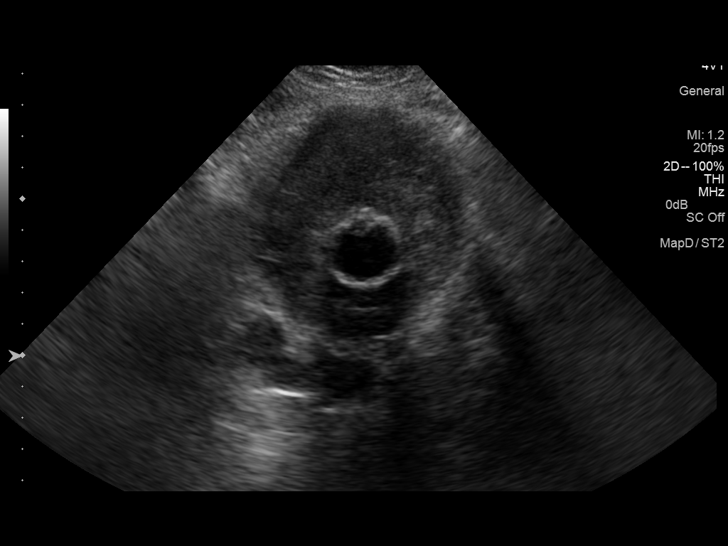

[14 of 21 positions shown; findings below may reference images not displayed]

FINDINGS: Right Kidney:

Length: 12.3 cm. Normal echotexture. Moderately dilated collecting
system. No perinephric fluid.

Left Kidney:

Length: 12.7 cm. Normal echotexture. Mildly dilated collecting
system. No perinephric fluid.

Bladder:

Marked diffuse wall thickening surrounding a Foley catheter without
significant urine in the bladder. The maximum wall thickness is
cm.
IMPRESSION: 1. Moderate right hydronephrosis and mild left hydronephrosis. This
appears improved since the previous CT.
2. Stable marked diffuse bladder wall thickening. Differential
considerations include cystitis and chronic bladder outlet
obstruction. Malignancy is less likely.

## 2016-04-28 NOTE — Progress Notes (Signed)
Hgb improved. Had post-polypectomy bleed. Recheck CBC in 6 weeks.

## 2016-05-02 ENCOUNTER — Other Ambulatory Visit: Payer: Self-pay | Admitting: Gastroenterology

## 2016-05-02 DIAGNOSIS — D649 Anemia, unspecified: Secondary | ICD-10-CM

## 2016-05-02 NOTE — Progress Notes (Signed)
Taken care of and has been seen in follow up.

## 2016-05-15 ENCOUNTER — Ambulatory Visit (INDEPENDENT_AMBULATORY_CARE_PROVIDER_SITE_OTHER): Payer: Medicare Other | Admitting: *Deleted

## 2016-05-15 ENCOUNTER — Telehealth: Payer: Self-pay | Admitting: Cardiology

## 2016-05-15 DIAGNOSIS — I442 Atrioventricular block, complete: Secondary | ICD-10-CM

## 2016-05-15 LAB — CUP PACEART REMOTE DEVICE CHECK
Battery Remaining Longevity: 117 mo
Battery Remaining Percentage: 95 %
Battery Voltage: 2.99 V
Date Time Interrogation Session: 20180115173632
Implantable Lead Implant Date: 20141114
Implantable Lead Location: 753860
Implantable Pulse Generator Implant Date: 20141114
Lead Channel Pacing Threshold Amplitude: 0.875 V
Lead Channel Setting Pacing Amplitude: 2.5 V
Lead Channel Setting Pacing Pulse Width: 0.4 ms
MDC IDC LEAD IMPLANT DT: 20141114
MDC IDC LEAD LOCATION: 753859
MDC IDC MSMT LEADCHNL RA IMPEDANCE VALUE: 350 Ohm
MDC IDC MSMT LEADCHNL RA PACING THRESHOLD AMPLITUDE: 0.5 V
MDC IDC MSMT LEADCHNL RA PACING THRESHOLD PULSEWIDTH: 0.4 ms
MDC IDC MSMT LEADCHNL RA SENSING INTR AMPL: 5 mV
MDC IDC MSMT LEADCHNL RV IMPEDANCE VALUE: 460 Ohm
MDC IDC MSMT LEADCHNL RV PACING THRESHOLD PULSEWIDTH: 0.4 ms
MDC IDC PG SERIAL: 7520753
MDC IDC SET LEADCHNL RV PACING AMPLITUDE: 1.125
MDC IDC SET LEADCHNL RV SENSING SENSITIVITY: 4 mV
MDC IDC STAT BRADY RA PERCENT PACED: 1.4 %
MDC IDC STAT BRADY RV PERCENT PACED: 99 % — AB
Pulse Gen Model: 2160

## 2016-05-15 NOTE — Progress Notes (Signed)
Remote pacemaker transmission.   

## 2016-05-15 NOTE — Telephone Encounter (Signed)
Spoke with pt and reminded pt of remote transmission that is due today. Pt verbalized understanding.   

## 2016-05-24 ENCOUNTER — Encounter: Payer: Self-pay | Admitting: Cardiology

## 2016-05-26 ENCOUNTER — Other Ambulatory Visit: Payer: Self-pay

## 2016-05-26 DIAGNOSIS — D649 Anemia, unspecified: Secondary | ICD-10-CM

## 2016-06-19 ENCOUNTER — Other Ambulatory Visit: Payer: Self-pay | Admitting: *Deleted

## 2016-06-19 MED ORDER — METOPROLOL SUCCINATE ER 25 MG PO TB24: 12.5000 mg | ORAL_TABLET | Freq: Every day | ORAL | 3 refills | Status: DC

## 2016-06-19 MED ORDER — AMLODIPINE BESYLATE 10 MG PO TABS: 10.0000 mg | ORAL_TABLET | Freq: Every day | ORAL | 3 refills | Status: DC

## 2016-07-12 DIAGNOSIS — E559 Vitamin D deficiency, unspecified: Secondary | ICD-10-CM | POA: Insufficient documentation

## 2016-07-12 DIAGNOSIS — I10 Essential (primary) hypertension: Secondary | ICD-10-CM | POA: Insufficient documentation

## 2016-07-12 DIAGNOSIS — N189 Chronic kidney disease, unspecified: Secondary | ICD-10-CM | POA: Insufficient documentation

## 2016-07-12 DIAGNOSIS — N400 Enlarged prostate without lower urinary tract symptoms: Secondary | ICD-10-CM | POA: Insufficient documentation

## 2016-07-12 DIAGNOSIS — K229 Disease of esophagus, unspecified: Secondary | ICD-10-CM | POA: Insufficient documentation

## 2016-08-14 ENCOUNTER — Encounter: Payer: Medicare Other | Admitting: *Deleted

## 2016-08-14 ENCOUNTER — Telehealth: Payer: Self-pay | Admitting: Cardiology

## 2016-08-14 NOTE — Telephone Encounter (Signed)
LMOVM reminding pt to send remote transmission.   

## 2016-08-18 ENCOUNTER — Encounter: Payer: Self-pay | Admitting: Cardiology

## 2016-11-03 ENCOUNTER — Encounter: Payer: Self-pay | Admitting: Internal Medicine

## 2016-11-03 ENCOUNTER — Ambulatory Visit (INDEPENDENT_AMBULATORY_CARE_PROVIDER_SITE_OTHER): Payer: Medicare Other | Admitting: Internal Medicine

## 2016-11-03 VITALS — BP 147/81 | HR 69 | Ht 71.5 in | Wt 200.2 lb

## 2016-11-03 DIAGNOSIS — I10 Essential (primary) hypertension: Secondary | ICD-10-CM

## 2016-11-03 DIAGNOSIS — I442 Atrioventricular block, complete: Secondary | ICD-10-CM

## 2016-11-03 NOTE — Progress Notes (Signed)
PCP: Jacqualine Code, DO Primary Cardiologist:  Dr Freddy Jaksch Troy Petty is a 66 y.o. male who presents today for routine electrophysiology followup.  Since last being seen in our clinic, the patient reports doing very well.  Today, he denies symptoms of palpitations, chest pain, shortness of breath,  lower extremity edema, dizziness, presyncope, or syncope.  The patient is otherwise without complaint today.   Past Medical History:  Diagnosis Date  . Arthritis    MIDDLE FINGER AND RING FINGER BOTH HANDS  . Chronic kidney disease, stage III (moderate)   . Chronic renal insufficiency    baseline creatinine is 1.8  . Chronic systolic dysfunction of left ventricle 4/15   EF 40-45% in setting of PMT on limited echo  . Complete heart block ([place])   . COPD (chronic obstructive pulmonary disease) ([place])   . Coronary artery disease    NON OBSTRUCTIVE CAD; DR. Harl Bowie WITH [facility] IS PT'S CARDIOLOGIST  . GERD (gastroesophageal reflux disease)   . Hemorrhoids   . Hypertension   . Hypertrophy of prostate with urinary obstruction and other lower urinary tract symptoms (LUTS)    INDWELLING FOLEY CATHETER  . Metabolic syndrome   . Mild pulmonary hypertension ([place])   . Pacemaker    IMPLANTED Mar 14, 2013 - DR. Kelli Egolf WITH [facility] FOLLOWS DEVICE FUNCTIONING  . Pain    BACK PAIN AT TIMES  . Shortness of breath    WITH EXERTION  . Vitamin B12 deficiency    Past Surgical History:  Procedure Laterality Date  . CARDIAC CATHETERIZATION  2014  . COLON SURGERY     COLON RESECTION FOR BLOCKAGE  . COLONOSCOPY N/A 04/12/2016   Dr. Gala Romney: 10 mm polyp, two 4 mm polyps at transverse, tubulovillous adenoma, surveillance in 3 years.   . COLONOSCOPY N/A 04/12/2016   Dr. Laural Golden: post-polypectomy bleed s/p clips at transverse colon   . GREEN LIGHT LASER TURP (TRANSURETHRAL RESECTION OF PROSTATE N/A 02/13/2014   Procedure: GREEN LIGHT LASER TURP (TRANSURETHRAL RESECTION OF PROSTATE;   Surgeon: Festus Aloe, MD;  Location: [facility];  Service: Urology;  Laterality: N/A;  . OTHER SURGICAL HISTORY     Intestinal blockage x2  . OTHER SURGICAL HISTORY  1964   Football injury  . PACEMAKER INSERTION  03/14/13   SJM Endurity implanted by Dr Truman Hayward in [place] for complete heart block    ROS- all systems are reviewed and negative except as per HPI above  Current Outpatient Prescriptions  Medication Sig Dispense Refill  . amLODipine (NORVASC) 5 MG tablet Take 5 mg by mouth daily.    . metoprolol succinate (TOPROL-XL) 25 MG 24 hr tablet Take 0.5 tablets (12.5 mg total) by mouth daily. 45 tablet 3  . omeprazole (PRILOSEC) 40 MG capsule Take 1 capsule by mouth daily as needed.      No current facility-administered medications for this visit.     Physical Exam: Vitals:   11/03/16 1110  BP: (!) 147/81  Pulse: 69  SpO2: 93%  Weight: 200 lb 3.2 oz (90.8 kg)  Height: 5' 11.5" (1.816 m)    GEN- The patient is well appearing, alert and oriented x 3 today.   Head- normocephalic, atraumatic Eyes-  Sclera clear, conjunctiva pink Ears- hearing intact Oropharynx- clear Lungs- Clear to ausculation bilaterally, normal work of breathing Chest- pacemaker pocket is well healed Heart- Regular rate and rhythm, no murmurs, rubs or gallops, PMI not laterally displaced GI- soft, NT, ND, + BS Extremities-  no clubbing, cyanosis, or edema  Pacemaker interrogation- reviewed in detail today,  See PACEART report  Assessment and Plan:  1. Complete haert block Normal pacemaker function See Pace Art report autocapture on V lead is noted to cause PVCs on T wave and nonsustained VT.  I have therefore turned autocapture off today Rare atrial lead noise is noted  2. HTN Elevated today I have advised that he check his BP a few times over the next 2 months at home and bring readings when he sees Dr Harl Bowie in September.  3. Mild nonobstructive CAD No ischemic symptoms No  changes   Follow-up: merlin Return to see me in 1 year Follow-up with Dr Harl Bowie as scheduled  Thompson Grayer MD, [facility] 11/03/2016 11:26 AM

## 2016-11-03 NOTE — Patient Instructions (Signed)
Medication Instructions:  Continue all current medications.  Labwork: none  Testing/Procedures: none  Follow-Up: Your physician wants you to follow up in:  1 year.  You will receive a reminder letter in the mail one-two months in advance.  If you don't receive a letter, please call our office to schedule the follow up appointment.  (ALLRED)  Any Other Special Instructions Will Be Listed Below (If Applicable). Remote monitoring is used to monitor your Pacemaker of ICD from home. This monitoring reduces the number of office visits required to check your device to one time per year. It allows [place] to keep an eye on the functioning of your device to ensure it is working properly. You are scheduled for a device check from home on 02-05-2017. You may send your transmission at any time that day. If you have a wireless device, the transmission will be sent automatically. After your physician reviews your transmission, you will receive a postcard with your next transmission date.  If you need a refill on your cardiac medications before your next appointment, please call your pharmacy.

## 2016-11-07 LAB — CUP PACEART INCLINIC DEVICE CHECK
Battery Voltage: 2.99 V
Brady Statistic RA Percent Paced: 1.2 %
Brady Statistic RV Percent Paced: 99.56 %
Implantable Lead Implant Date: 20141114
Implantable Lead Location: 753860
Implantable Pulse Generator Implant Date: 20141114
Lead Channel Impedance Value: 475 Ohm
Lead Channel Pacing Threshold Amplitude: 0.5 V
Lead Channel Pacing Threshold Amplitude: 1 V
Lead Channel Pacing Threshold Pulse Width: 0.4 ms
Lead Channel Sensing Intrinsic Amplitude: 5 mV
Lead Channel Sensing Intrinsic Amplitude: 8.2 mV
Lead Channel Setting Pacing Amplitude: 2.5 V
Lead Channel Setting Pacing Pulse Width: 0.4 ms
Lead Channel Setting Sensing Sensitivity: 4 mV
MDC IDC LEAD IMPLANT DT: 20141114
MDC IDC LEAD LOCATION: 753859
MDC IDC MSMT BATTERY REMAINING LONGEVITY: 110 mo
MDC IDC MSMT LEADCHNL RA IMPEDANCE VALUE: 387.5 Ohm
MDC IDC MSMT LEADCHNL RA PACING THRESHOLD AMPLITUDE: 0.5 V
MDC IDC MSMT LEADCHNL RA PACING THRESHOLD PULSEWIDTH: 0.4 ms
MDC IDC MSMT LEADCHNL RV PACING THRESHOLD AMPLITUDE: 1 V
MDC IDC MSMT LEADCHNL RV PACING THRESHOLD PULSEWIDTH: 0.4 ms
MDC IDC MSMT LEADCHNL RV PACING THRESHOLD PULSEWIDTH: 0.4 ms
MDC IDC SESS DTM: 20180706151557
MDC IDC SET LEADCHNL RV PACING AMPLITUDE: 2.5 V
Pulse Gen Model: 2160
Pulse Gen Serial Number: 7520753

## 2017-01-09 ENCOUNTER — Encounter: Payer: Self-pay | Admitting: *Deleted

## 2017-01-09 ENCOUNTER — Ambulatory Visit (INDEPENDENT_AMBULATORY_CARE_PROVIDER_SITE_OTHER): Payer: Medicare Other | Admitting: Cardiology

## 2017-01-09 ENCOUNTER — Encounter: Payer: Self-pay | Admitting: Cardiology

## 2017-01-09 VITALS — BP 128/80 | HR 66 | Ht 71.0 in | Wt 200.8 lb

## 2017-01-09 DIAGNOSIS — I10 Essential (primary) hypertension: Secondary | ICD-10-CM

## 2017-01-09 DIAGNOSIS — Z8679 Personal history of other diseases of the circulatory system: Secondary | ICD-10-CM

## 2017-01-09 DIAGNOSIS — Z136 Encounter for screening for cardiovascular disorders: Secondary | ICD-10-CM | POA: Diagnosis not present

## 2017-01-09 DIAGNOSIS — I442 Atrioventricular block, complete: Secondary | ICD-10-CM

## 2017-01-09 DIAGNOSIS — I2510 Atherosclerotic heart disease of native coronary artery without angina pectoris: Secondary | ICD-10-CM | POA: Diagnosis not present

## 2017-01-09 NOTE — Progress Notes (Signed)
Clinical Summary Troy Petty is a 66 y.o.male seen today for follow up of the following medical problems.  1. History of cardiomyopathy/ Low normal to mildly decreased LV systolic function  - echo 09/2013 LVEF 69-48%, grade I diastolic dysfunction. Apex appears to be hypokinetic though poor visualization  - prior cath in [facility] 10/2012 with only mid LAD 20% lesion, otherwise patent vessels.  - repeat echo 10/2014 with LVEF 60-65%, no WMAs.  - Toprol decreased during EP visit due to mild SOB. Breathing improved lower dose Toprol.   - stable SOB with activities, example cutting grass. No recent edema  2. Non-obstructive CAD  - mild LAD lesion from cath 2014   - no recent chest pain  3. Complete heart block  - [employer] pacemaker followed by Dr Troy Petty  - normal function by last check 10/2016. He denies any significant fatigue, dizziness, syncope  4. HTN  - does not check at home regularly -he is compliant with his home meds. Hombe bp log on average < 130/80   Past Medical History:  Diagnosis Date  . Arthritis    MIDDLE FINGER AND RING FINGER BOTH HANDS  . Chronic kidney disease, stage III (moderate)   . Chronic renal insufficiency    baseline creatinine is 1.8  . Chronic systolic dysfunction of left ventricle 4/15   EF 40-45% in setting of PMT on limited echo  . Complete heart block ([place])   . COPD (chronic obstructive pulmonary disease) ([place])   . Coronary artery disease    NON OBSTRUCTIVE CAD; DR. Harl Petty WITH [facility] IS PT'S CARDIOLOGIST  . GERD (gastroesophageal reflux disease)   . Hemorrhoids   . Hypertension   . Hypertrophy of prostate with urinary obstruction and other lower urinary tract symptoms (LUTS)    INDWELLING FOLEY CATHETER  . Metabolic syndrome   . Mild pulmonary hypertension ([place])   . Pacemaker    IMPLANTED Mar 14, 2013 - DR. ALLRED WITH [facility] FOLLOWS DEVICE FUNCTIONING  . Pain    BACK PAIN AT TIMES  . Shortness of  breath    WITH EXERTION  . Vitamin B12 deficiency      No Known Allergies   Current Outpatient Prescriptions  Medication Sig Dispense Refill  . amLODipine (NORVASC) 5 MG tablet Take 5 mg by mouth daily.    . metoprolol succinate (TOPROL-XL) 25 MG 24 hr tablet Take 0.5 tablets (12.5 mg total) by mouth daily. 45 tablet 3  . omeprazole (PRILOSEC) 40 MG capsule Take 1 capsule by mouth daily as needed.      No current facility-administered medications for this visit.      Past Surgical History:  Procedure Laterality Date  . CARDIAC CATHETERIZATION  2014  . COLON SURGERY     COLON RESECTION FOR BLOCKAGE  . COLONOSCOPY N/A 04/12/2016   Dr. Gala Petty: 10 mm polyp, two 4 mm polyps at transverse, tubulovillous adenoma, surveillance in 3 years.   . COLONOSCOPY N/A 04/12/2016   Dr. Laural Petty: post-polypectomy bleed s/p clips at transverse colon   . GREEN LIGHT LASER TURP (TRANSURETHRAL RESECTION OF PROSTATE N/A 02/13/2014   Procedure: GREEN LIGHT LASER TURP (TRANSURETHRAL RESECTION OF PROSTATE;  Surgeon: Troy Aloe, MD;  Location: [facility];  Service: Urology;  Laterality: N/A;  . OTHER SURGICAL HISTORY     Intestinal blockage x2  . OTHER SURGICAL HISTORY  1964   Football injury  . PACEMAKER INSERTION  03/14/13   SJM Endurity implanted by  Dr Troy Petty in [place] for complete heart block     No Known Allergies    Family History  Problem Relation Age of Onset  . Cancer Father   . Colon cancer Father      Social History [name] reports that he quit smoking about 5 years ago. His smoking use included Cigarettes. He started smoking about 50 years ago. He has a 45.00 pack-year smoking history. He has never used smokeless tobacco. [name] reports that he does not drink alcohol.   Review of Systems CONSTITUTIONAL: No weight loss, fever, chills, weakness or fatigue.  HEENT: Eyes: No visual loss, blurred vision, double vision or yellow sclerae.No hearing loss, sneezing,  congestion, runny nose or sore throat.  SKIN: No rash or itching.  CARDIOVASCULAR: per hpi RESPIRATORY: per hpi.  GASTROINTESTINAL: No anorexia, nausea, vomiting or diarrhea. No abdominal pain or blood.  GENITOURINARY: No burning on urination, no polyuria NEUROLOGICAL: No headache, dizziness, syncope, paralysis, ataxia, numbness or tingling in the extremities. No change in bowel or bladder control.  MUSCULOSKELETAL: No muscle, back pain, joint pain or stiffness.  LYMPHATICS: No enlarged nodes. No history of splenectomy.  PSYCHIATRIC: No history of depression or anxiety.  ENDOCRINOLOGIC: No reports of sweating, cold or heat intolerance. No polyuria or polydipsia.  Marland Kitchen   Physical Examination Vitals:   01/09/17 1547  BP: 128/80  Pulse: 66  SpO2: 93%   Vitals:   01/09/17 1547  Weight: 200 lb 12.8 oz (91.1 kg)  Height: 5\' 11"  (1.803 m)    Gen: resting comfortably, no acute distress HEENT: no scleral icterus, pupils equal round and reactive, no palptable cervical adenopathy,  CV: RRR, no m/rg, no jvd Resp: Clear to auscultation bilaterally GI: abdomen is soft, non-tender, non-distended, normal bowel sounds, no hepatosplenomegaly MSK: extremities are warm, no edema.  Skin: warm, no rash Neuro:  no focal deficits Psych: appropriate affect   Diagnostic Studies 09/2013 Echo Study Conclusions  - Left ventricle: The cavity size was normal. Wall thickness was normal. Systolic function was mildly reduced. The estimated ejection fraction was in the range of 45% to 50%. Doppler parameters are consistent with abnormal left ventricular relaxation (grade 1 diastolic dysfunction). - Regional wall motion abnormality: Several of the views are foreshortened, however overall the apex does appear hypokinetic. Hypokinesis of the apical anterior, apical inferior, apical septal, apical lateral, and apical myocardium. Can consider echo with contrast for more definitive evaluation of wall  motion. - Aortic valve: Mildly calcified annulus. Trileaflet; mildly thickened leaflets. Valve area (VTI): 3.76 cm^2. Valve area (Vmax): 3.65 cm^2. - Mitral valve: Mildly calcified annulus. Mildly thickened leaflets . 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 - Systemic veins: The IVC is small, suggesting low RA pressures and hypovolemia.  10/2012 [facility] RHC: RA 5, PA 40/12 (mean not reported) PCWP 12, PA sat 75%.  LHC: LM patent, LAD mid 20%, LCX patent, RCA anomolous origine from left cusp patent. LVEF 70%.   10/2014 echo Study Conclusions  - Left ventricle: The cavity size was normal. Wall thickness was increased in a pattern of mild LVH. Systolic function was normal. The estimated ejection fraction was in the range of 60% to 65%. Wall motion was normal; there were no regional wall motion abnormalities. Doppler parameters are consistent with abnormal left ventricular relaxation (grade 1 diastolic dysfunction). - Aortic valve: Mildly calcified annulus. Trileaflet; mildly thickened leaflets. Valve area (VTI): 4.75 cm^2. Valve area (Vmax): 3.73 cm^2. Valve area (Vmean): 3.54 cm^2. -  Mitral valve: Mildly calcified annulus. Mildly thickened leaflets . - Left atrium: The atrium was mildly dilated. - Atrial septum: No defect or patent foramen ovale was identified. - Pulmonary arteries: Systolic pressure was moderately increased. PA peak pressure: 44 mm Hg (S). - Technically adequate study.    Assessment and Plan  1. History of cardiomyopathy - LVEF has now normalized - denies any significant symptoms - continue to monitor  2. Non-obstructive CAD  - cath with mild LAD lesion - he denies any symptoms, we will continue to monitor    3. HTN  - his bp's are at goal, continue current meds  4. Complete heart block -normal pacemaker function by last check, contniue to follow in device clinic  5. AAA screen - male  over 26 with tobacco history, order AAA screening US     F/u 1 year      Arnoldo Lenis, M.D., F.A.C.C.

## 2017-01-09 NOTE — Patient Instructions (Signed)
Your physician wants you to follow-up in: 1 YEAR WITH DR. BRANCH You will receive a reminder letter in the mail two months in advance. If you don't receive a letter, please call our office to schedule the follow-up appointment.  Your physician recommends that you continue on your current medications as directed. Please refer to the Current Medication list given to you today.  Your physician has requested that you have an abdominal aorta duplex. During this test, an ultrasound is used to evaluate the aorta. Allow 30 minutes for this exam. Do not eat after midnight the day before and avoid carbonated beverages  Thank you for choosing [facility]!!   

## 2017-01-10 ENCOUNTER — Telehealth: Payer: Self-pay | Admitting: Cardiology

## 2017-01-10 NOTE — Telephone Encounter (Signed)
Pre-cert Verification for the following procedure   AAA Scheduled 01-24-17

## 2017-01-14 IMAGING — CR DG CHEST 1V PORT
1 series · 2 of 2 positions shown · non-contrast
Comparison: Chest radiograph January 16, 2014

CLINICAL DATA: Rectal bleeding after colonoscopy April 12, 2016.
Possible sepsis. History of COPD, hypertension.

EXAM:
PORTABLE CHEST 1 VIEW

[Series 1: ap · 0.17mm/px · 2 of 2 slices shown]
[im 1/2]
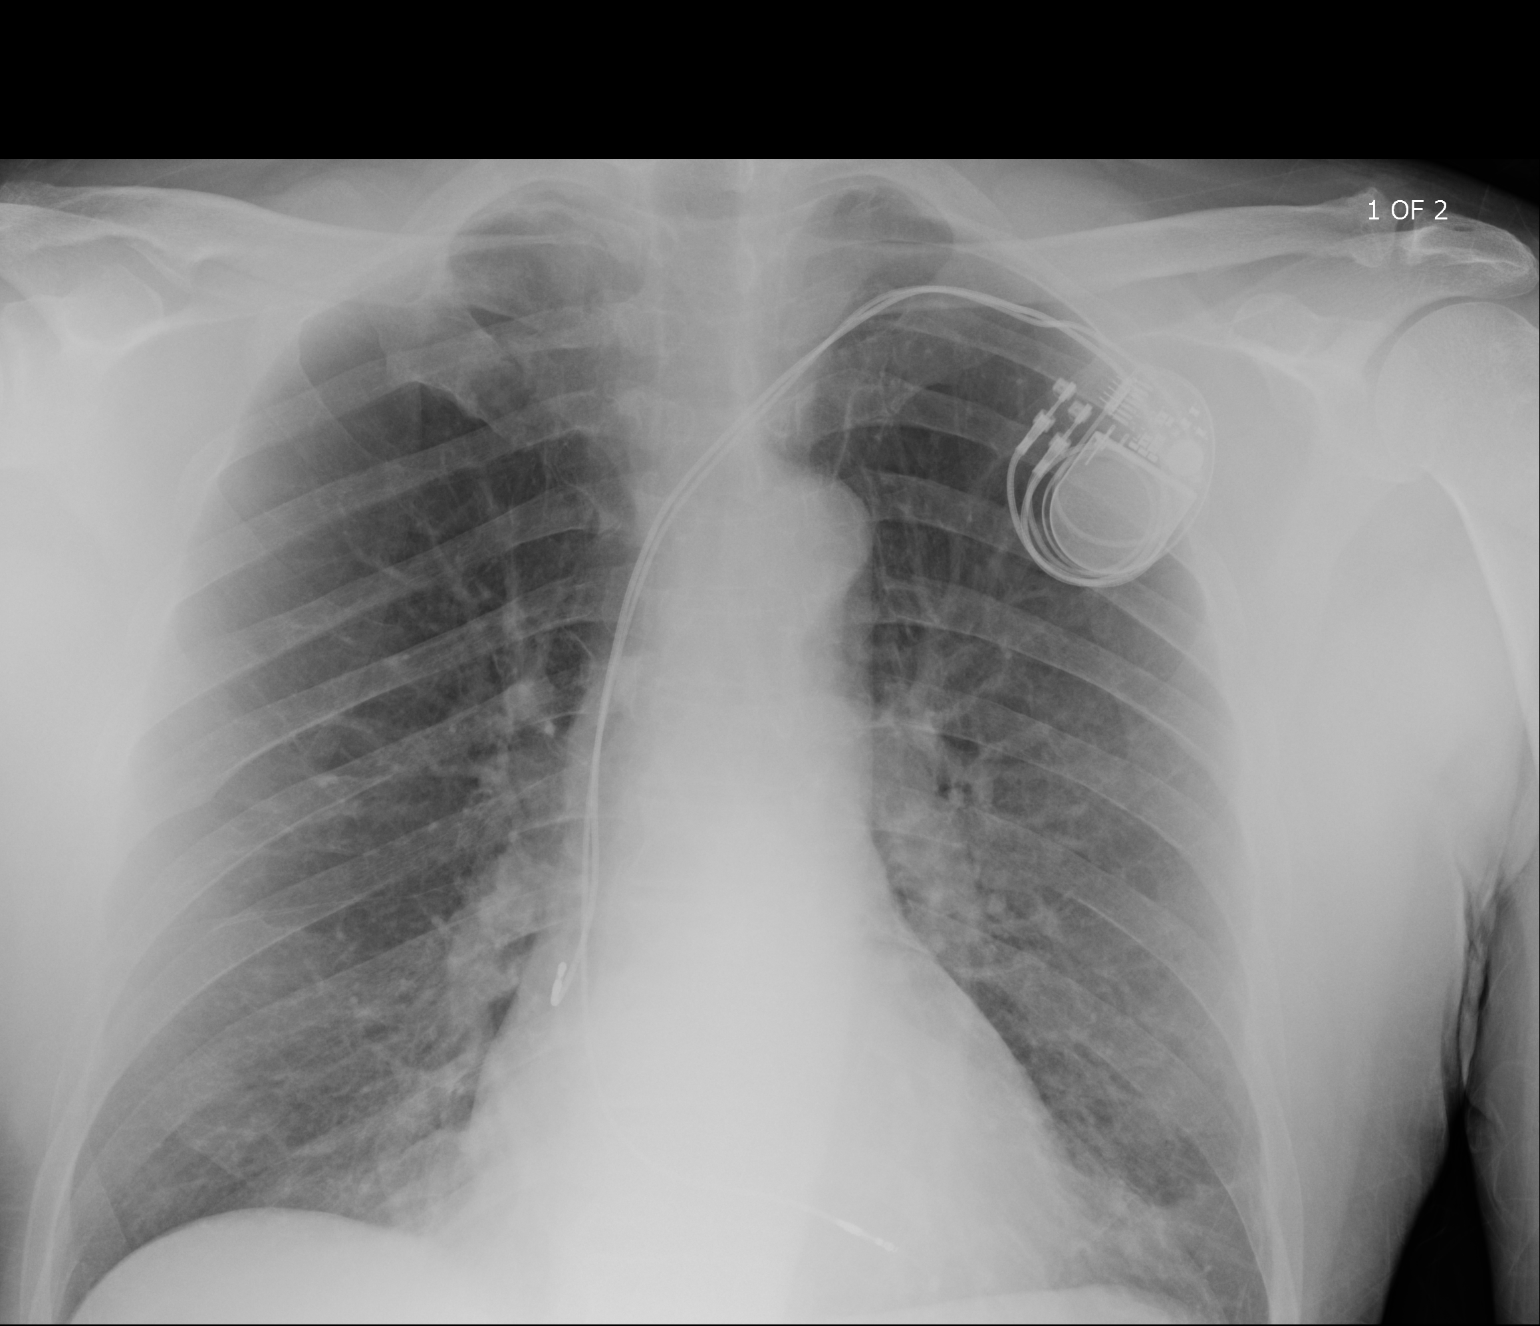
[im 2/2]
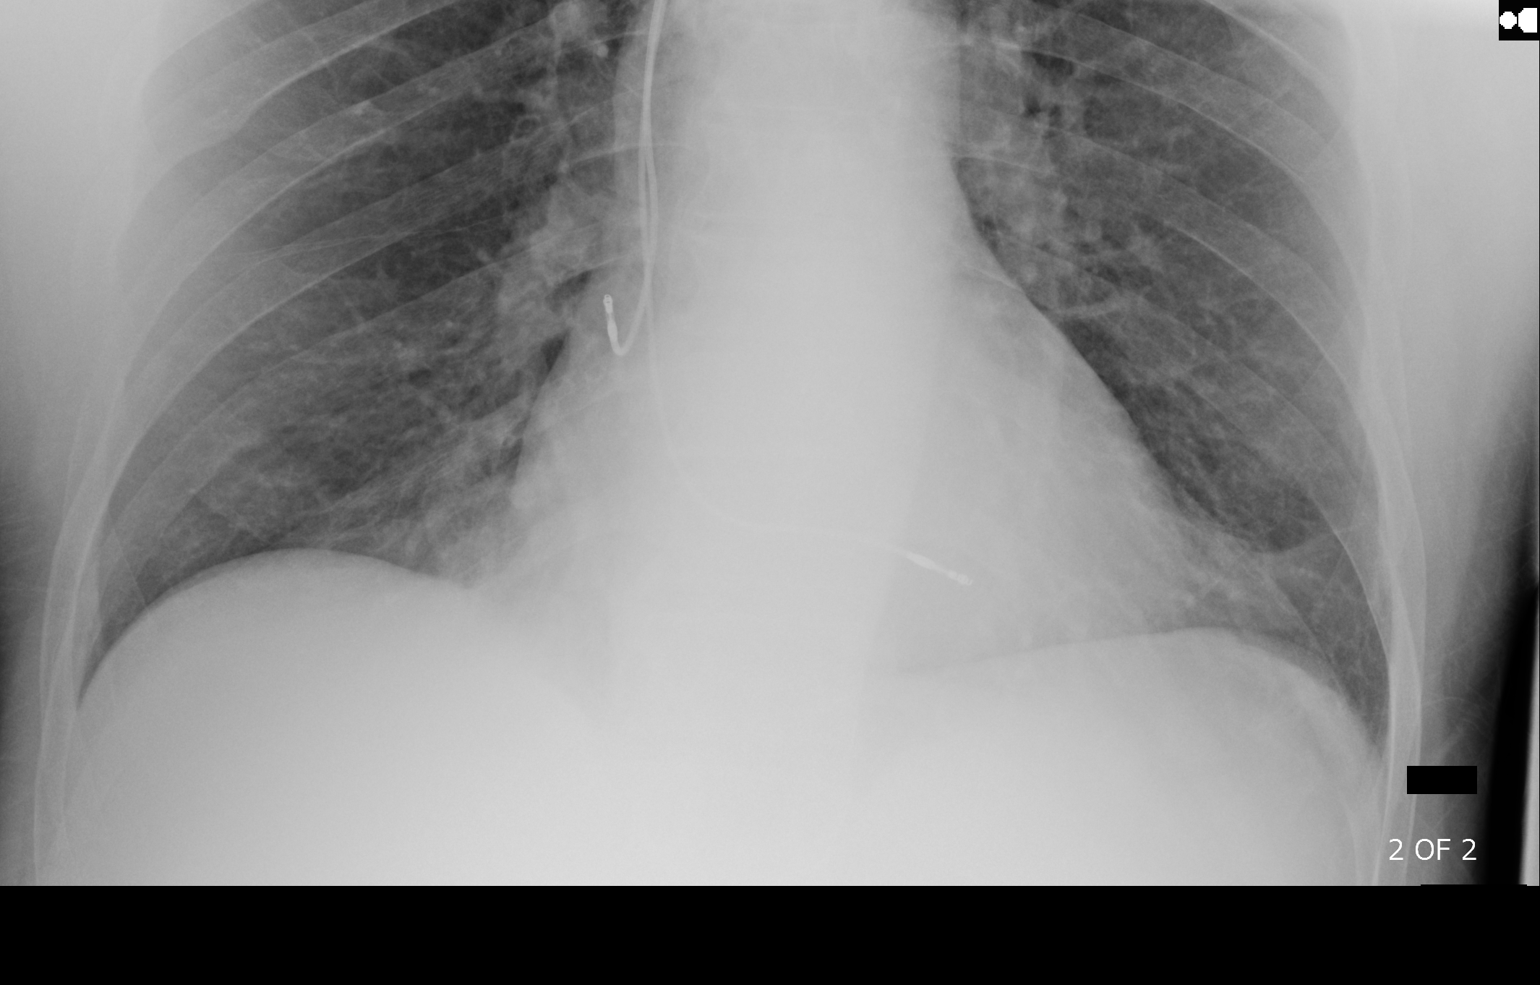

[2 of 2 positions shown; findings below may reference images not displayed]

FINDINGS: Cardiac silhouette is upper limits of normal in size, mediastinal
silhouette is nonsuspicious. Increased lung volumes, mild bronchitic
changes without pleural effusion or focal consolidation. No
pneumothorax. Old RIGHT posterior rib fractures. Dual lead LEFT
cardiac pacemaker in situ. Soft tissue planes are nonsuspicious.
IMPRESSION: Borderline cardiomegaly.  COPD without acute pulmonary process.

## 2017-01-24 ENCOUNTER — Ambulatory Visit: Payer: Medicare Other

## 2017-01-24 DIAGNOSIS — Z136 Encounter for screening for cardiovascular disorders: Secondary | ICD-10-CM | POA: Diagnosis not present

## 2017-01-24 NOTE — Progress Notes (Unsigned)
No

## 2017-02-01 ENCOUNTER — Telehealth: Payer: Self-pay | Admitting: *Deleted

## 2017-02-01 NOTE — Telephone Encounter (Signed)
-----   Message from Arnoldo Lenis, MD sent at 01/30/2017  1:52 PM EDT ----- Normal aortic US, no evidence of aneurysm  Zandra Abts MD

## 2017-02-01 NOTE — Telephone Encounter (Signed)
Pt aware - routed to pcp  

## 2017-02-05 ENCOUNTER — Ambulatory Visit (INDEPENDENT_AMBULATORY_CARE_PROVIDER_SITE_OTHER): Payer: Medicare Other | Admitting: *Deleted

## 2017-02-05 ENCOUNTER — Telehealth: Payer: Self-pay | Admitting: Cardiology

## 2017-02-05 DIAGNOSIS — I442 Atrioventricular block, complete: Secondary | ICD-10-CM | POA: Diagnosis not present

## 2017-02-05 NOTE — Telephone Encounter (Signed)
Spoke with pt and reminded pt of remote transmission that is due today. Pt verbalized understanding.   

## 2017-02-06 NOTE — Progress Notes (Signed)
Remote pacemaker transmission.   

## 2017-02-09 ENCOUNTER — Encounter: Payer: Self-pay | Admitting: Cardiology

## 2017-02-26 LAB — CUP PACEART REMOTE DEVICE CHECK
Implantable Lead Implant Date: 20141114
Implantable Lead Location: 753859
Lead Channel Setting Pacing Amplitude: 2.5 V
Lead Channel Setting Pacing Amplitude: 2.5 V
Lead Channel Setting Pacing Pulse Width: 0.4 ms
MDC IDC LEAD IMPLANT DT: 20141114
MDC IDC LEAD LOCATION: 753860
MDC IDC PG IMPLANT DT: 20141114
MDC IDC SESS DTM: 20181029202931
MDC IDC SET LEADCHNL RV SENSING SENSITIVITY: 4 mV
Pulse Gen Model: 2160
Pulse Gen Serial Number: 7520753

## 2017-05-07 ENCOUNTER — Ambulatory Visit (INDEPENDENT_AMBULATORY_CARE_PROVIDER_SITE_OTHER): Payer: Medicare Other | Admitting: *Deleted

## 2017-05-07 DIAGNOSIS — I442 Atrioventricular block, complete: Secondary | ICD-10-CM

## 2017-05-07 NOTE — Progress Notes (Signed)
Remote pacemaker transmission.   

## 2017-05-08 ENCOUNTER — Encounter: Payer: Self-pay | Admitting: Cardiology

## 2017-05-12 LAB — CUP PACEART REMOTE DEVICE CHECK
Date Time Interrogation Session: 20190112185424
Implantable Lead Implant Date: 20141114
Implantable Lead Implant Date: 20141114
Implantable Lead Location: 753859
Lead Channel Setting Pacing Amplitude: 2.5 V
Lead Channel Setting Pacing Pulse Width: 0.4 ms
Lead Channel Setting Sensing Sensitivity: 4 mV
MDC IDC LEAD LOCATION: 753860
MDC IDC PG IMPLANT DT: 20141114
MDC IDC PG SERIAL: 7520753
MDC IDC SET LEADCHNL RA PACING AMPLITUDE: 2.5 V

## 2017-06-28 ENCOUNTER — Other Ambulatory Visit: Payer: Self-pay | Admitting: *Deleted

## 2017-06-28 MED ORDER — METOPROLOL SUCCINATE ER 25 MG PO TB24: 12.5000 mg | ORAL_TABLET | Freq: Every day | ORAL | 1 refills | Status: DC

## 2017-06-28 MED ORDER — AMLODIPINE BESYLATE 5 MG PO TABS: 5.0000 mg | ORAL_TABLET | Freq: Every day | ORAL | 1 refills | Status: DC

## 2017-07-02 ENCOUNTER — Other Ambulatory Visit: Payer: Self-pay | Admitting: Cardiology

## 2017-07-04 ENCOUNTER — Telehealth: Payer: Self-pay | Admitting: *Deleted

## 2017-07-04 MED ORDER — AMLODIPINE BESYLATE 10 MG PO TABS: 10.0000 mg | ORAL_TABLET | Freq: Every day | ORAL | 1 refills | Status: DC

## 2017-07-04 NOTE — Telephone Encounter (Signed)
Pt requests new rx for amlodipine 10 mg (says he has been taking (2) 5 mg tablets - Pt says he has been taking amlodipine 10 mg daily for several years - last 2 office notes has 5 mg - previous notes say 10 mg - no phone notes or ED visits that indicate why this was changed. Ok to send new rx for amlodipine 10 mg ?

## 2017-07-04 NOTE — Telephone Encounter (Signed)
Yes please prescribe norvasc 10mg  daily   J Robertlee Rogacki MD

## 2017-07-04 NOTE — Telephone Encounter (Signed)
Amlodipine 10 mg sent to [facility] [place] - pt made aware

## 2017-08-06 ENCOUNTER — Telehealth: Payer: Self-pay | Admitting: Cardiology

## 2017-08-06 ENCOUNTER — Ambulatory Visit (INDEPENDENT_AMBULATORY_CARE_PROVIDER_SITE_OTHER): Payer: Medicare Other | Admitting: *Deleted

## 2017-08-06 DIAGNOSIS — I442 Atrioventricular block, complete: Secondary | ICD-10-CM

## 2017-08-06 NOTE — Telephone Encounter (Signed)
Spoke with pt and reminded pt of remote transmission that is due today. Pt verbalized understanding.   

## 2017-08-07 NOTE — Progress Notes (Signed)
Remote pacemaker transmission.   

## 2017-08-08 ENCOUNTER — Encounter: Payer: Self-pay | Admitting: Cardiology

## 2017-08-29 LAB — CUP PACEART REMOTE DEVICE CHECK
Implantable Lead Implant Date: 20141114
Implantable Lead Implant Date: 20141114
Implantable Pulse Generator Implant Date: 20141114
Lead Channel Setting Pacing Amplitude: 2.5 V
Lead Channel Setting Pacing Pulse Width: 0.4 ms
Lead Channel Setting Sensing Sensitivity: 4 mV
MDC IDC LEAD LOCATION: 753859
MDC IDC LEAD LOCATION: 753860
MDC IDC SESS DTM: 20190501085359
MDC IDC SET LEADCHNL RV PACING AMPLITUDE: 2.5 V
Pulse Gen Model: 2160
Pulse Gen Serial Number: 7520753

## 2017-11-02 ENCOUNTER — Encounter: Payer: Medicare Other | Admitting: Internal Medicine

## 2017-11-05 ENCOUNTER — Ambulatory Visit (INDEPENDENT_AMBULATORY_CARE_PROVIDER_SITE_OTHER): Payer: Medicare Other | Admitting: *Deleted

## 2017-11-05 ENCOUNTER — Telehealth: Payer: Self-pay | Admitting: Cardiology

## 2017-11-05 DIAGNOSIS — I442 Atrioventricular block, complete: Secondary | ICD-10-CM | POA: Diagnosis not present

## 2017-11-05 NOTE — Telephone Encounter (Signed)
LMOVM reminding pt to send remote transmission.   

## 2017-11-06 NOTE — Progress Notes (Signed)
Remote pacemaker transmission.   

## 2017-11-13 LAB — CUP PACEART REMOTE DEVICE CHECK
Implantable Lead Implant Date: 20141114
Implantable Lead Implant Date: 20141114
Implantable Lead Location: 753860
Implantable Pulse Generator Implant Date: 20141114
MDC IDC LEAD LOCATION: 753859
MDC IDC PG SERIAL: 7520753
MDC IDC SESS DTM: 20190716103019

## 2017-11-14 ENCOUNTER — Telehealth: Payer: Self-pay

## 2017-11-14 NOTE — Telephone Encounter (Signed)
LVM for pt to call device clinic direct number to device clinic given.

## 2017-11-14 NOTE — Telephone Encounter (Signed)
-----   Message from Ranee Gosselin, RN sent at 11/14/2017  8:22 AM EDT ----- Regarding: FW: Patient : Question    ----- Message ----- From: Thayer Headings Sent: 11/13/2017   2:57 PM To: Susette Racer Long, RN Subject: Patient : Question                             Abbe Amsterdam,  I work at [employer] in the [employer]. Patient left a voice mail on my phone asking if we received his device check from last week. Would this go to you?    Patient Contact #  [phone]   Thanks! Caryl Pina

## 2017-11-26 ENCOUNTER — Encounter: Payer: Self-pay | Admitting: Internal Medicine

## 2017-11-26 ENCOUNTER — Ambulatory Visit (INDEPENDENT_AMBULATORY_CARE_PROVIDER_SITE_OTHER): Payer: Medicare Other | Admitting: Internal Medicine

## 2017-11-26 VITALS — BP 130/78 | HR 85 | Ht 71.0 in | Wt 202.4 lb

## 2017-11-26 DIAGNOSIS — I10 Essential (primary) hypertension: Secondary | ICD-10-CM | POA: Diagnosis not present

## 2017-11-26 DIAGNOSIS — I442 Atrioventricular block, complete: Secondary | ICD-10-CM

## 2017-11-26 DIAGNOSIS — I2510 Atherosclerotic heart disease of native coronary artery without angina pectoris: Secondary | ICD-10-CM | POA: Diagnosis not present

## 2017-11-26 DIAGNOSIS — Z950 Presence of cardiac pacemaker: Secondary | ICD-10-CM

## 2017-11-26 LAB — CUP PACEART INCLINIC DEVICE CHECK
Battery Remaining Longevity: 112 mo
Brady Statistic RA Percent Paced: 1.8 %
Brady Statistic RV Percent Paced: 98 %
Implantable Lead Implant Date: 20141114
Implantable Lead Location: 753859
Implantable Lead Location: 753860
Lead Channel Impedance Value: 512.5 Ohm
Lead Channel Pacing Threshold Amplitude: 0.5 V
Lead Channel Pacing Threshold Amplitude: 1 V
Lead Channel Pacing Threshold Pulse Width: 0.4 ms
Lead Channel Sensing Intrinsic Amplitude: 5 mV
Lead Channel Setting Pacing Amplitude: 2.5 V
Lead Channel Setting Pacing Pulse Width: 0.4 ms
Lead Channel Setting Sensing Sensitivity: 4 mV
MDC IDC LEAD IMPLANT DT: 20141114
MDC IDC MSMT BATTERY VOLTAGE: 2.98 V
MDC IDC MSMT LEADCHNL RA IMPEDANCE VALUE: 362.5 Ohm
MDC IDC MSMT LEADCHNL RA PACING THRESHOLD AMPLITUDE: 0.5 V
MDC IDC MSMT LEADCHNL RA PACING THRESHOLD PULSEWIDTH: 0.4 ms
MDC IDC MSMT LEADCHNL RV PACING THRESHOLD AMPLITUDE: 1 V
MDC IDC MSMT LEADCHNL RV PACING THRESHOLD PULSEWIDTH: 0.4 ms
MDC IDC MSMT LEADCHNL RV PACING THRESHOLD PULSEWIDTH: 0.4 ms
MDC IDC MSMT LEADCHNL RV SENSING INTR AMPL: 12 mV
MDC IDC PG IMPLANT DT: 20141114
MDC IDC SESS DTM: 20190729155443
MDC IDC SET LEADCHNL RA PACING AMPLITUDE: 2.5 V
Pulse Gen Model: 2160
Pulse Gen Serial Number: 7520753

## 2017-11-26 NOTE — Patient Instructions (Signed)
Medication Instructions:  Your physician recommends that you continue on your current medications as directed. Please refer to the Current Medication list given to you today.  Labwork: None ordered.  Testing/Procedures: None ordered.  Follow-Up: Your physician wants you to follow-up in: one year with Dr. Rayann Heman in [place].   You will receive a reminder letter in the mail two months in advance. If you don't receive a letter, please call our office to schedule the follow-up appointment.  Remote monitoring is used to monitor your Pacemaker from home. This monitoring reduces the number of office visits required to check your device to one time per year. It allows [place] to keep an eye on the functioning of your device to ensure it is working properly. You are scheduled for a device check from home on 02/04/2018. You may send your transmission at any time that day. If you have a wireless device, the transmission will be sent automatically. After your physician reviews your transmission, you will receive a postcard with your next transmission date.  Any Other Special Instructions Will Be Listed Below (If Applicable).  If you need a refill on your cardiac medications before your next appointment, please call your pharmacy.

## 2017-11-26 NOTE — Progress Notes (Signed)
PCP: Jacqualine Code, DO Primary Cardiologist: Dr Harl Bowie Primary EP:  Dr Rayann Heman  Troy Petty is a 67 y.o. male who presents today for routine electrophysiology followup.  Since last being seen in our clinic, the patient reports doing very well.  Today, he denies symptoms of palpitations, chest pain, shortness of breath,  lower extremity edema, dizziness, presyncope, or syncope.  The patient is otherwise without complaint today.   Past Medical History:  Diagnosis Date  . Arthritis    MIDDLE FINGER AND RING FINGER BOTH HANDS  . Chronic kidney disease, stage III (moderate) ([facility])   . Chronic renal insufficiency    baseline creatinine is 1.8  . Chronic systolic dysfunction of left ventricle 4/15   EF 40-45% in setting of PMT on limited echo  . Complete heart block ([place])   . COPD (chronic obstructive pulmonary disease) ([place])   . Coronary artery disease    NON OBSTRUCTIVE CAD; DR. Harl Bowie WITH [facility] IS PT'S CARDIOLOGIST  . GERD (gastroesophageal reflux disease)   . Hemorrhoids   . Hypertension   . Hypertrophy of prostate with urinary obstruction and other lower urinary tract symptoms (LUTS)    INDWELLING FOLEY CATHETER  . Metabolic syndrome   . Mild pulmonary hypertension ([place])   . Pacemaker    IMPLANTED Mar 14, 2013 - DR. Elesha Thedford WITH [facility] FOLLOWS DEVICE FUNCTIONING  . Pain    BACK PAIN AT TIMES  . Shortness of breath    WITH EXERTION  . Vitamin B12 deficiency    Past Surgical History:  Procedure Laterality Date  . CARDIAC CATHETERIZATION  2014  . COLON SURGERY     COLON RESECTION FOR BLOCKAGE  . COLONOSCOPY N/A 04/12/2016   Dr. Gala Romney: 10 mm polyp, two 4 mm polyps at transverse, tubulovillous adenoma, surveillance in 3 years.   . COLONOSCOPY N/A 04/12/2016   Dr. Laural Golden: post-polypectomy bleed s/p clips at transverse colon   . GREEN LIGHT LASER TURP (TRANSURETHRAL RESECTION OF PROSTATE N/A 02/13/2014   Procedure: GREEN LIGHT LASER TURP  (TRANSURETHRAL RESECTION OF PROSTATE;  Surgeon: Festus Aloe, MD;  Location: [facility];  Service: Urology;  Laterality: N/A;  . OTHER SURGICAL HISTORY     Intestinal blockage x2  . OTHER SURGICAL HISTORY  1964   Football injury  . PACEMAKER INSERTION  03/14/13   SJM Endurity implanted by Dr Truman Hayward in [place] for complete heart block    ROS- all systems are reviewed and negative except as per HPI above  Current Outpatient Medications  Medication Sig Dispense Refill  . amLODipine (NORVASC) 10 MG tablet Take 1 tablet (10 mg total) by mouth daily. 90 tablet 1  . metoprolol succinate (TOPROL-XL) 25 MG 24 hr tablet Take 0.5 tablets (12.5 mg total) by mouth daily. 45 tablet 1  . omeprazole (PRILOSEC) 40 MG capsule Take 1 capsule by mouth daily as needed.      No current facility-administered medications for this visit.     Physical Exam: Vitals:   11/26/17 1459  BP: 130/78  Pulse: 85  Weight: 202 lb 6.4 oz (91.8 kg)  Height: 5\' 11"  (1.803 m)    GEN- The patient is well appearing, alert and oriented x 3 today.   Head- normocephalic, atraumatic Eyes-  Sclera clear, conjunctiva pink Ears- hearing intact Oropharynx- clear Lungs- Clear to ausculation bilaterally, normal work of breathing Chest- pacemaker pocket is well healed Heart- Regular rate and rhythm, no murmurs, rubs or gallops, PMI not laterally displaced  GI- soft, NT, ND, + BS Extremities- no clubbing, cyanosis, or edema  Pacemaker interrogation- reviewed in detail today,  See PACEART report  ekg tracing ordered today is personally reviewed and shows sinus with V pacing  Assessment and Plan:  1. Symptomatic complete heart block Normal pacemaker function See Pace Art report No changes today  2. HTN Stable No change required today  3. Mild nonobstructive CAD No ischemic symptoms No changes  Merlin Return to see me in [place] in a year Follow-up with Dr Harl Bowie as scheduled  Thompson Grayer MD,  [facility] 11/26/2017 3:32 PM

## 2017-12-24 ENCOUNTER — Other Ambulatory Visit: Payer: Self-pay | Admitting: Cardiology

## 2018-02-04 ENCOUNTER — Ambulatory Visit (INDEPENDENT_AMBULATORY_CARE_PROVIDER_SITE_OTHER): Payer: Medicare Other | Admitting: *Deleted

## 2018-02-04 ENCOUNTER — Telehealth: Payer: Self-pay | Admitting: Cardiology

## 2018-02-04 DIAGNOSIS — I442 Atrioventricular block, complete: Secondary | ICD-10-CM

## 2018-02-04 LAB — CUP PACEART REMOTE DEVICE CHECK
Implantable Lead Implant Date: 20141114
Implantable Lead Implant Date: 20141114
Implantable Lead Location: 753859
MDC IDC LEAD LOCATION: 753860
MDC IDC PG IMPLANT DT: 20141114
MDC IDC PG SERIAL: 7520753
MDC IDC SESS DTM: 20191119144410
Pulse Gen Model: 2160

## 2018-02-04 NOTE — Telephone Encounter (Signed)
Spoke with pt and reminded pt of remote transmission that is due today. Pt verbalized understanding.   

## 2018-02-05 NOTE — Progress Notes (Signed)
Remote pacemaker transmission.   

## 2018-03-02 ENCOUNTER — Other Ambulatory Visit: Payer: Self-pay | Admitting: Cardiology

## 2018-03-26 ENCOUNTER — Other Ambulatory Visit: Payer: Self-pay | Admitting: Cardiology

## 2018-04-10 ENCOUNTER — Other Ambulatory Visit: Payer: Self-pay | Admitting: Cardiology

## 2018-04-29 ENCOUNTER — Other Ambulatory Visit: Payer: Self-pay | Admitting: *Deleted

## 2018-04-29 MED ORDER — AMLODIPINE BESYLATE 10 MG PO TABS: 10.0000 mg | ORAL_TABLET | Freq: Every day | ORAL | 2 refills | Status: DC

## 2018-04-29 MED ORDER — METOPROLOL SUCCINATE ER 25 MG PO TB24: 12.5000 mg | ORAL_TABLET | Freq: Every day | ORAL | 2 refills | Status: DC

## 2018-05-06 ENCOUNTER — Ambulatory Visit: Payer: Medicare Other

## 2018-05-09 ENCOUNTER — Encounter: Payer: Self-pay | Admitting: Cardiology

## 2018-05-09 ENCOUNTER — Ambulatory Visit (INDEPENDENT_AMBULATORY_CARE_PROVIDER_SITE_OTHER): Payer: Medicare Other

## 2018-05-09 DIAGNOSIS — I10 Essential (primary) hypertension: Secondary | ICD-10-CM

## 2018-05-09 DIAGNOSIS — I442 Atrioventricular block, complete: Secondary | ICD-10-CM

## 2018-05-09 LAB — CUP PACEART REMOTE DEVICE CHECK
Date Time Interrogation Session: 20200109191150
Implantable Lead Implant Date: 20141114
Implantable Lead Location: 753859
Implantable Lead Location: 753860
MDC IDC LEAD IMPLANT DT: 20141114
MDC IDC PG IMPLANT DT: 20141114
MDC IDC PG SERIAL: 7520753
Pulse Gen Model: 2160

## 2018-05-10 NOTE — Progress Notes (Signed)
Remote pacemaker transmission.   

## 2018-08-07 ENCOUNTER — Other Ambulatory Visit: Payer: Self-pay | Admitting: Cardiology

## 2018-08-08 ENCOUNTER — Telehealth: Payer: Self-pay

## 2018-08-08 ENCOUNTER — Ambulatory Visit (INDEPENDENT_AMBULATORY_CARE_PROVIDER_SITE_OTHER): Payer: Medicare Other | Admitting: *Deleted

## 2018-08-08 ENCOUNTER — Other Ambulatory Visit: Payer: Self-pay

## 2018-08-08 DIAGNOSIS — I442 Atrioventricular block, complete: Secondary | ICD-10-CM | POA: Diagnosis not present

## 2018-08-08 LAB — CUP PACEART REMOTE DEVICE CHECK
Battery Remaining Longevity: 108 mo
Battery Remaining Percentage: 95 %
Battery Voltage: 2.98 V
Brady Statistic RA Percent Paced: 2.2 %
Brady Statistic RV Percent Paced: 100 %
Date Time Interrogation Session: 20200409170503
Implantable Lead Implant Date: 20141114
Implantable Lead Implant Date: 20141114
Implantable Lead Location: 753859
Implantable Lead Location: 753860
Implantable Pulse Generator Implant Date: 20141114
Lead Channel Impedance Value: 360 Ohm
Lead Channel Impedance Value: 540 Ohm
Lead Channel Pacing Threshold Amplitude: 0.5 V
Lead Channel Pacing Threshold Pulse Width: 0.4 ms
Lead Channel Sensing Intrinsic Amplitude: 5 mV
Lead Channel Setting Pacing Amplitude: 2.5 V
Lead Channel Setting Pacing Amplitude: 2.5 V
Lead Channel Setting Pacing Pulse Width: 0.4 ms
Lead Channel Setting Sensing Sensitivity: 4 mV
Pulse Gen Model: 2160
Pulse Gen Serial Number: 7520753

## 2018-08-08 NOTE — Telephone Encounter (Signed)
Spoke with patient to remind of missed remote transmission 

## 2018-08-16 ENCOUNTER — Encounter: Payer: Self-pay | Admitting: Cardiology

## 2018-08-16 NOTE — Progress Notes (Signed)
Remote pacemaker transmission.   

## 2018-09-12 ENCOUNTER — Telehealth: Payer: Self-pay | Admitting: *Deleted

## 2018-09-12 NOTE — Telephone Encounter (Signed)
   Primary Cardiologist:  Dr. Harl Bowie    Patient contacted.  History reviewed.  No symptoms to suggest any unstable cardiac conditions.  Based on discussion, with current pandemic situation, we will be postponing this appointment for Troy Petty with a plan for f/u on 12/13/2018.  If symptoms change, he has been instructed to contact our office.   Patient stated he preferred face to face.      Troy Petty

## 2018-09-17 ENCOUNTER — Ambulatory Visit: Payer: Medicare Other | Admitting: Cardiology

## 2018-09-20 ENCOUNTER — Telehealth: Payer: Self-pay | Admitting: Cardiology

## 2018-09-20 NOTE — Telephone Encounter (Signed)
Call returned to patient - patient thought that it may be due to him wearing a mask - didn't feel like he could breathe good with it on.  Did not tell him the heart rate.  No c/o chest pain or dizziness.  SOB x several years - not new & only had one episode 4-5 weeks ago when it seemed more than usual.  Went outside to breathe, then felt better.  Does notice some fatigue, but not new either.  Will have him send remote transmission this evening to make sure nothing abnormal going on.  Informed patient that message will be sent to device clinic for notification and will be Tuesday before we get back with him.  If he is out of town, may speak with his wife.

## 2018-09-20 NOTE — Telephone Encounter (Signed)
Wife calling - Saw pmd 2-3 weeks ago - was told at that visit that his heart beat was fast.  They did not tell him the number.  No c/o dizziness, chest pain.  Not clear on the SOB.  Call placed to patient to ask more questions -  [phone] mobile number

## 2018-09-20 NOTE — Telephone Encounter (Signed)
Patient was told by his PCP that his heart is beating fast [phone]

## 2018-09-24 NOTE — Telephone Encounter (Signed)
Transmission received on 09/20/18 at 22:16. Normal device function. Histograms similar to previous transmissions. No true arrhythmia episodes since last transmission reviewed on 08/08/18. All AMS episodes from 09/20/18 appear appear atrial lead noise/oversensing, longest 16 seconds. 81 A noise reversions. Atrial lead noise previously noted.

## 2018-11-05 ENCOUNTER — Other Ambulatory Visit: Payer: Self-pay | Admitting: Cardiology

## 2018-11-05 ENCOUNTER — Other Ambulatory Visit: Payer: Self-pay | Admitting: *Deleted

## 2018-11-05 MED ORDER — METOPROLOL SUCCINATE ER 25 MG PO TB24: 12.5000 mg | ORAL_TABLET | Freq: Every day | ORAL | 0 refills | Status: DC

## 2018-11-07 ENCOUNTER — Encounter: Payer: Medicare Other | Admitting: *Deleted

## 2018-11-19 ENCOUNTER — Ambulatory Visit (INDEPENDENT_AMBULATORY_CARE_PROVIDER_SITE_OTHER): Payer: Medicare Other | Admitting: *Deleted

## 2018-11-19 DIAGNOSIS — I442 Atrioventricular block, complete: Secondary | ICD-10-CM | POA: Diagnosis not present

## 2018-11-19 LAB — CUP PACEART REMOTE DEVICE CHECK
Date Time Interrogation Session: 20200721200442
Implantable Lead Implant Date: 20141114
Implantable Lead Implant Date: 20141114
Implantable Lead Location: 753859
Implantable Lead Location: 753860
Implantable Pulse Generator Implant Date: 20141114
Pulse Gen Model: 2160
Pulse Gen Serial Number: 7520753

## 2018-12-04 ENCOUNTER — Encounter: Payer: Self-pay | Admitting: Cardiology

## 2018-12-04 NOTE — Progress Notes (Signed)
Remote pacemaker transmission.   

## 2018-12-13 ENCOUNTER — Encounter: Payer: Self-pay | Admitting: Cardiology

## 2018-12-13 ENCOUNTER — Ambulatory Visit (INDEPENDENT_AMBULATORY_CARE_PROVIDER_SITE_OTHER): Payer: Medicare Other | Admitting: Cardiology

## 2018-12-13 ENCOUNTER — Other Ambulatory Visit: Payer: Self-pay

## 2018-12-13 ENCOUNTER — Encounter: Payer: Self-pay | Admitting: *Deleted

## 2018-12-13 VITALS — BP 135/80 | HR 67 | Temp 97.7°F | Ht 71.0 in | Wt 201.0 lb

## 2018-12-13 DIAGNOSIS — I442 Atrioventricular block, complete: Secondary | ICD-10-CM

## 2018-12-13 DIAGNOSIS — I2510 Atherosclerotic heart disease of native coronary artery without angina pectoris: Secondary | ICD-10-CM

## 2018-12-13 DIAGNOSIS — I10 Essential (primary) hypertension: Secondary | ICD-10-CM

## 2018-12-13 DIAGNOSIS — Z8679 Personal history of other diseases of the circulatory system: Secondary | ICD-10-CM | POA: Diagnosis not present
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
## 2018-12-13 NOTE — Patient Instructions (Addendum)

## 2019-01-27 ENCOUNTER — Telehealth: Payer: Self-pay | Admitting: Internal Medicine
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
## 2019-01-27 NOTE — Telephone Encounter (Signed)
Patient called the [facility] office wanting to speak with someone in the [employer].
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
## 2019-01-29 ENCOUNTER — Telehealth: Payer: Self-pay | Admitting: *Deleted

## 2019-01-29 NOTE — Telephone Encounter (Signed)
Pt was concerned about his BP running higher than normal over the last few days - says DBP has been 80s-90s - pt will check BP a couple of times daily for a few days and call us back with readings - denies any symptoms - taking Toprol XL 12.5 mg daily and Amlodipine 10 mg daily

## 2019-01-31 ENCOUNTER — Other Ambulatory Visit: Payer: Self-pay | Admitting: Cardiology

## 2019-02-01 ENCOUNTER — Other Ambulatory Visit: Payer: Self-pay | Admitting: Cardiology

## 2019-02-06 NOTE — Telephone Encounter (Signed)
No change to meds.

## 2019-02-06 NOTE — Telephone Encounter (Signed)
Pt aware.

## 2019-02-06 NOTE — Telephone Encounter (Signed)
Patient called to report his recent BP readings :   10-01 136/80  Pm  10-02 139/78  AM  129/74  PM  10-03 136/80  PM   10/05 129/76  AM  140/74  PM  10/06 109/71  Am   109/71  PM  10/07 132/83  AM  119/81  PM

## 2019-02-18 ENCOUNTER — Ambulatory Visit (INDEPENDENT_AMBULATORY_CARE_PROVIDER_SITE_OTHER): Payer: Medicare Other | Admitting: *Deleted

## 2019-02-18 DIAGNOSIS — I442 Atrioventricular block, complete: Secondary | ICD-10-CM

## 2019-02-24 LAB — CUP PACEART REMOTE DEVICE CHECK
Date Time Interrogation Session: 20201026093927
Implantable Lead Implant Date: 20141114
Implantable Lead Implant Date: 20141114
Implantable Lead Location: 753859
Implantable Lead Location: 753860
Implantable Pulse Generator Implant Date: 20141114
Pulse Gen Model: 2160
Pulse Gen Serial Number: 7520753

## 2019-02-27 ENCOUNTER — Telehealth: Payer: Self-pay | Admitting: Internal Medicine

## 2019-02-27 NOTE — Telephone Encounter (Signed)
Virtual Visit Pre-Appointment Phone Call  "(Name), I am calling you today to discuss your upcoming appointment. We are currently trying to limit exposure to the virus that causes COVID-19 by seeing patients at home rather than in the office."  1. "What is the BEST phone number to call the day of the visit?" - include this in appointment notes  2. Do you have or have access to (through a family member/friend) a smartphone with video capability that we can use for your visit?" a. If yes - list this number in appt notes as cell (if different from BEST phone #) and list the appointment type as a VIDEO visit in appointment notes b. If no - list the appointment type as a PHONE visit in appointment notes  3. Confirm consent - "In the setting of the current Covid19 crisis, you are scheduled for a (phone or video) visit with your provider on (date) at (time).  Just as we do with many in-office visits, in order for you to participate in this visit, we must obtain consent.  If you'd like, I can send this to your mychart (if signed up) or email for you to review.  Otherwise, I can obtain your verbal consent now.  All virtual visits are billed to your insurance company just like a normal visit would be.  By agreeing to a virtual visit, we'd like you to understand that the technology does not allow for your provider to perform an examination, and thus may limit your provider's ability to fully assess your condition. If your provider identifies any concerns that need to be evaluated in person, we will make arrangements to do so.  Finally, though the technology is pretty good, we cannot assure that it will always work on either your or our end, and in the setting of a video visit, we may have to convert it to a phone-only visit.  In either situation, we cannot ensure that we have a secure connection.  Are you willing to proceed?" STAFF: Did the patient verbally acknowledge consent to telehealth visit? Document  YES/NO here: yes  4. Advise patient to be prepared - "Two hours prior to your appointment, go ahead and check your blood pressure, pulse, oxygen saturation, and your weight (if you have the equipment to check those) and write them all down. When your visit starts, your provider will ask you for this information. If you have an Apple Watch or Kardia device, please plan to have heart rate information ready on the day of your appointment. Please have a pen and paper handy nearby the day of the visit as well."  5. Give patient instructions for MyChart download to smartphone OR Doximity/Doxy.me as below if video visit (depending on what platform provider is using)  6. Inform patient they will receive a phone call 15 minutes prior to their appointment time (may be from unknown caller ID) so they should be prepared to answer    TELEPHONE CALL NOTE  Troy Petty has been deemed a candidate for a follow-up tele-health visit to limit community exposure during the Covid-19 pandemic. I spoke with the patient via phone to ensure availability of phone/video source, confirm preferred email & phone number, and discuss instructions and expectations.  I reminded Troy Petty to be prepared with any vital sign and/or heart rhythm information that could potentially be obtained via home monitoring, at the time of his visit. I reminded Troy Petty to expect a phone call prior to his visit.  Troy Petty 02/27/2019 4:26 PM   INSTRUCTIONS FOR DOWNLOADING THE MYCHART APP TO SMARTPHONE  - The patient must first make sure to have activated MyChart and know their login information - If Apple, go to [employer] and type in MyChart in the search bar and download the app. If Android, ask patient to go to [employer] and type in [place] in the search bar and download the app. The app is free but as with any other app downloads, their phone may require them to verify saved payment information or  Apple/Android password.  - The patient will need to then log into the app with their MyChart username and password, and select [facility] as their healthcare provider to link the account. When it is time for your visit, go to the MyChart app, find appointments, and click Begin Video Visit. Be sure to Select Allow for your device to access the Microphone and Camera for your visit. You will then be connected, and your provider will be with you shortly.  **If they have any issues connecting, or need assistance please contact MyChart service desk [phone])**  **If using a computer, in order to ensure the best quality for their visit they will need to use either of the following Internet Browsers: [employer], or Google Chrome**  IF USING DOXIMITY or DOXY.ME - The patient will receive a link just prior to their visit by text.     FULL LENGTH CONSENT FOR TELE-HEALTH VISIT   I hereby voluntarily request, consent and authorize [facility] and its employed or contracted physicians, physician assistants, nurse practitioners or other licensed health care professionals (the Practitioner), to provide me with telemedicine health care services (the Services") as deemed necessary by the treating Practitioner. I acknowledge and consent to receive the Services by the Practitioner via telemedicine. I understand that the telemedicine visit will involve communicating with the Practitioner through live audiovisual communication technology and the disclosure of certain medical information by electronic transmission. I acknowledge that I have been given the opportunity to request an in-person assessment or other available alternative prior to the telemedicine visit and am voluntarily participating in the telemedicine visit.  I understand that I have the right to withhold or withdraw my consent to the use of telemedicine in the course of my care at any time, without affecting my right to future care  or treatment, and that the Practitioner or I may terminate the telemedicine visit at any time. I understand that I have the right to inspect all information obtained and/or recorded in the course of the telemedicine visit and may receive copies of available information for a reasonable fee.  I understand that some of the potential risks of receiving the Services via telemedicine include:   Delay or interruption in medical evaluation due to technological equipment failure or disruption;  Information transmitted may not be sufficient (e.g. poor resolution of images) to allow for appropriate medical decision making by the Practitioner; and/or   In rare instances, security protocols could fail, causing a breach of personal health information.  Furthermore, I acknowledge that it is my responsibility to provide information about my medical history, conditions and care that is complete and accurate to the best of my ability. I acknowledge that Practitioner's advice, recommendations, and/or decision may be based on factors not within their control, such as incomplete or inaccurate data provided by me or distortions of diagnostic images or specimens that may result from electronic transmissions. I understand that the  practice of medicine is not an Chief Strategy Officer and that Practitioner makes no warranties or guarantees regarding treatment outcomes. I acknowledge that I will receive a copy of this consent concurrently upon execution via email to the email address I last provided but may also request a printed copy by calling the office of [facility].    I understand that my insurance will be billed for this visit.   I have read or had this consent read to me.  I understand the contents of this consent, which adequately explains the benefits and risks of the Services being provided via telemedicine.   I have been provided ample opportunity to ask questions regarding this consent and the Services and have had  my questions answered to my satisfaction.  I give my informed consent for the services to be provided through the use of telemedicine in my medical care  By participating in this telemedicine visit I agree to the above.

## 2019-03-05 NOTE — Progress Notes (Signed)
Remote pacemaker transmission.   

## 2019-03-07 ENCOUNTER — Encounter: Payer: Self-pay | Admitting: Internal Medicine

## 2019-03-07 ENCOUNTER — Telehealth (INDEPENDENT_AMBULATORY_CARE_PROVIDER_SITE_OTHER): Payer: Medicare Other | Admitting: Internal Medicine

## 2019-03-07 VITALS — BP 124/63 | HR 69 | Ht 71.0 in | Wt 192.0 lb

## 2019-03-07 DIAGNOSIS — I442 Atrioventricular block, complete: Secondary | ICD-10-CM | POA: Diagnosis not present

## 2019-03-07 DIAGNOSIS — I2510 Atherosclerotic heart disease of native coronary artery without angina pectoris: Secondary | ICD-10-CM

## 2019-03-07 DIAGNOSIS — I10 Essential (primary) hypertension: Secondary | ICD-10-CM

## 2019-03-07 NOTE — Patient Instructions (Signed)
Medication Instructions:  Continue all current medications.  Labwork: none  Testing/Procedures: none  Follow-Up: 1 year   Any Other Special Instructions Will Be Listed Below (If Applicable). Next remote as planned.   If you need a refill on your cardiac medications before your next appointment, please call your pharmacy.  

## 2019-03-07 NOTE — Progress Notes (Signed)
Electrophysiology TeleHealth Note   Due to national recommendations of social distancing due to [place] 19, an audio telehealth visit is felt to be most appropriate for this patient at this time.  Verbal consent was obtained by me for the telehealth visit today.  The patient does not have capability for a virtual visit.  A phone visit is therefore required today.   Date:  03/07/2019   ID:  Troy Petty, DOB 07/03/1950, MRN [id number]  Location: patient's home  Provider location:  [facility]  Evaluation Performed: Follow-up visit  PCP:  Jacqualine Code, DO   Electrophysiologist:  Dr Rayann Heman  Chief Complaint:  Pacemaker follow up  History of Present Illness:    Troy Petty is a 68 y.o. male who presents via telehealth conferencing today.  Since last being seen in our clinic, the patient reports doing very well.  Today, he denies symptoms of palpitations, chest pain, shortness of breath,  lower extremity edema, dizziness, presyncope, or syncope.  The patient is otherwise without complaint today.  The patient denies symptoms of fevers, chills, cough, or new SOB worrisome for COVID 19.  Past Medical History:  Diagnosis Date  . Arthritis    MIDDLE FINGER AND RING FINGER BOTH HANDS  . Chronic kidney disease, stage III (moderate)   . Chronic renal insufficiency    baseline creatinine is 1.8  . Chronic systolic dysfunction of left ventricle 4/15   EF 40-45% in setting of PMT on limited echo  . Complete heart block ([place])   . COPD (chronic obstructive pulmonary disease) ([place])   . Coronary artery disease    NON OBSTRUCTIVE CAD; DR. Harl Bowie WITH [facility] IS PT'S CARDIOLOGIST  . GERD (gastroesophageal reflux disease)   . Hemorrhoids   . Hypertension   . Hypertrophy of prostate with urinary obstruction and other lower urinary tract symptoms (LUTS)    INDWELLING FOLEY CATHETER  . Metabolic syndrome   . Mild pulmonary hypertension ([place])   . Pacemaker    IMPLANTED  Mar 14, 2013 - DR. Gibson Lad WITH [facility] FOLLOWS DEVICE FUNCTIONING  . Pain    BACK PAIN AT TIMES  . Shortness of breath    WITH EXERTION  . Vitamin B12 deficiency     Past Surgical History:  Procedure Laterality Date  . CARDIAC CATHETERIZATION  2014  . COLON SURGERY     COLON RESECTION FOR BLOCKAGE  . COLONOSCOPY N/A 04/12/2016   Dr. Gala Romney: 10 mm polyp, two 4 mm polyps at transverse, tubulovillous adenoma, surveillance in 3 years.   . COLONOSCOPY N/A 04/12/2016   Dr. Laural Golden: post-polypectomy bleed s/p clips at transverse colon   . GREEN LIGHT LASER TURP (TRANSURETHRAL RESECTION OF PROSTATE N/A 02/13/2014   Procedure: GREEN LIGHT LASER TURP (TRANSURETHRAL RESECTION OF PROSTATE;  Surgeon: Festus Aloe, MD;  Location: [facility];  Service: Urology;  Laterality: N/A;  . OTHER SURGICAL HISTORY     Intestinal blockage x2  . OTHER SURGICAL HISTORY  1964   Football injury  . PACEMAKER INSERTION  03/14/13   SJM Endurity implanted by Dr Truman Hayward in [place] for complete heart block    Current Outpatient Medications  Medication Sig Dispense Refill  . amLODipine (NORVASC) 10 MG tablet TAKE 1 TABLET BY MOUTH EVERY DAY 90 tablet 2  . Ergocalciferol (VITAMIN D2 PO) Take by mouth.    . Magnesium 400 MG TABS Take 1 tablet by mouth daily.    . metoprolol succinate (TOPROL-XL) 25 MG 24 hr  tablet TAKE 1/2 TABLET BY MOUTH EVERY DAY 45 tablet 3  . omeprazole (PRILOSEC) 40 MG capsule Take 1 capsule by mouth daily as needed.      No current facility-administered medications for this visit.     Allergies:   Patient has no known allergies.   Social History:  The patient  reports that he quit smoking about 7 years ago. His smoking use included cigarettes. He started smoking about 52 years ago. He has a 45.00 pack-year smoking history. He has never used smokeless tobacco. He reports that he does not drink alcohol or use drugs.   Family History:  The patient's  family history includes Cancer in  his father; Colon cancer in his father.   ROS:  Please see the history of present illness.   All other systems are personally reviewed and negative.    Exam:    Vital Signs:  BP 124/63   Pulse 69   Ht 5\' 11"  (1.803 m)   Wt 192 lb (87.1 kg)   BMI 26.78 kg/m   Well sounding and appearing, alert and conversant, regular work of breathing  Labs/Other Tests and Data Reviewed:    Recent Labs: No results found for requested labs within last 8760 hours.   Wt Readings from Last 3 Encounters:  03/07/19 192 lb (87.1 kg)  12/13/18 201 lb (91.2 kg)  11/26/17 202 lb 6.4 oz (91.8 kg)     Last device remote is reviewed from [employer] PDF which reveals normal device function    ASSESSMENT & PLAN:    1.  Complete heart block Normal pacemaker function by recent remote See [employer] report He does have some noise on RA lead - other lead trends stable Will follow  2.  HTN Stable No change required today  3.  CAD No recent symptoms Continue current thearpy   Follow-up:  [place]lin, 1 year with me    Patient Risk:  after full review of this patients clinical status, I feel that they are at moderate risk at this time.  Today, I have spent 15 minutes with the patient with telehealth technology discussing arrhythmia management .    Army Fossa, MD  03/07/2019 11:42 AM     [facility] [address] [place] [address] [phone] (office) [phone] (fax)

## 2019-03-25 ENCOUNTER — Encounter: Payer: Self-pay | Admitting: Internal Medicine

## 2019-06-02 ENCOUNTER — Ambulatory Visit (INDEPENDENT_AMBULATORY_CARE_PROVIDER_SITE_OTHER): Payer: Medicare Other | Admitting: *Deleted

## 2019-06-02 DIAGNOSIS — I442 Atrioventricular block, complete: Secondary | ICD-10-CM

## 2019-06-03 LAB — CUP PACEART REMOTE DEVICE CHECK
Date Time Interrogation Session: 20210201121312
Implantable Lead Implant Date: 20141114
Implantable Lead Implant Date: 20141114
Implantable Lead Location: 753859
Implantable Lead Location: 753860
Implantable Pulse Generator Implant Date: 20141114
Pulse Gen Model: 2160
Pulse Gen Serial Number: 7520753

## 2019-06-03 NOTE — Progress Notes (Signed)
PPM Remote  

## 2019-09-01 ENCOUNTER — Ambulatory Visit (INDEPENDENT_AMBULATORY_CARE_PROVIDER_SITE_OTHER): Payer: Medicare Other | Admitting: *Deleted

## 2019-09-01 DIAGNOSIS — I442 Atrioventricular block, complete: Secondary | ICD-10-CM | POA: Diagnosis not present

## 2019-09-02 ENCOUNTER — Telehealth: Payer: Self-pay

## 2019-09-02 LAB — CUP PACEART REMOTE DEVICE CHECK
Battery Remaining Longevity: 103 mo
Battery Remaining Percentage: 95.5 %
Battery Voltage: 2.96 V
Brady Statistic AP VP Percent: 2 %
Brady Statistic AP VS Percent: 1 %
Brady Statistic AS VP Percent: 98 %
Brady Statistic AS VS Percent: 1 %
Brady Statistic RA Percent Paced: 1.8 %
Brady Statistic RV Percent Paced: 99 %
Date Time Interrogation Session: 20210504102851
Implantable Lead Implant Date: 20141114
Implantable Lead Implant Date: 20141114
Implantable Lead Location: 753859
Implantable Lead Location: 753860
Implantable Pulse Generator Implant Date: 20141114
Lead Channel Impedance Value: 300 Ohm
Lead Channel Impedance Value: 480 Ohm
Lead Channel Pacing Threshold Amplitude: 0.375 V
Lead Channel Pacing Threshold Amplitude: 1 V
Lead Channel Pacing Threshold Pulse Width: 0.4 ms
Lead Channel Pacing Threshold Pulse Width: 0.4 ms
Lead Channel Sensing Intrinsic Amplitude: 12 mV
Lead Channel Sensing Intrinsic Amplitude: 3.1 mV
Lead Channel Setting Pacing Amplitude: 2.5 V
Lead Channel Setting Pacing Amplitude: 2.5 V
Lead Channel Setting Pacing Pulse Width: 0.4 ms
Lead Channel Setting Sensing Sensitivity: 4 mV
Pulse Gen Model: 2160
Pulse Gen Serial Number: 7520753

## 2019-09-02 NOTE — Telephone Encounter (Signed)
Spoke with patient to remind of missed remote transmission 

## 2019-09-03 NOTE — Progress Notes (Signed)
Remote pacemaker transmission.   

## 2019-09-14 NOTE — Progress Notes (Signed)
Electrophysiology Office Note Date: 09/15/2019  ID:  Troy Petty, DOB [birth year], MRN [id number]  PCP: Jacqualine Code, DO Primary Cardiologist: Carlyle Dolly, MD Electrophysiologist: Thompson Grayer, MD   CC: Pacemaker follow-up  Troy Petty is a 69 y.o. male seen today for Thompson Grayer, MD for routine electrophysiology followup.  Since last being seen in our clinic the patient reports doing well overall. He was recently diagnosed with Diabetes and started on metformin. He has mild chronic SOB, seems worse when he first starts exercise, then improves. Denies PND, orthopnea, or exertional chest pain. No palpitations, syncope, near syncope, or edema. Trying to be more active, walking weather permitting.  Device History: St. Jude Dual Chamber PPM implanted 03/2013 for CHB  Past Medical History:  Diagnosis Date  . Arthritis    MIDDLE FINGER AND RING FINGER BOTH HANDS  . Chronic kidney disease, stage III (moderate)   . Chronic renal insufficiency    baseline creatinine is 1.8  . Chronic systolic dysfunction of left ventricle 4/15   EF 40-45% in setting of PMT on limited echo  . Complete heart block ([place])   . COPD (chronic obstructive pulmonary disease) ([place])   . Coronary artery disease    NON OBSTRUCTIVE CAD; DR. Harl Bowie WITH [facility] IS PT'S CARDIOLOGIST  . GERD (gastroesophageal reflux disease)   . Hemorrhoids   . Hypertension   . Hypertrophy of prostate with urinary obstruction and other lower urinary tract symptoms (LUTS)    INDWELLING FOLEY CATHETER  . Metabolic syndrome   . Mild pulmonary hypertension ([place])   . Pacemaker    IMPLANTED Mar 14, 2013 - DR. ALLRED WITH [facility] FOLLOWS DEVICE FUNCTIONING  . Pain    BACK PAIN AT TIMES  . Shortness of breath    WITH EXERTION  . Vitamin B12 deficiency    Past Surgical History:  Procedure Laterality Date  . CARDIAC CATHETERIZATION  2014  . COLON SURGERY     COLON RESECTION FOR BLOCKAGE  .  COLONOSCOPY N/A 04/12/2016   Dr. Gala Romney: 10 mm polyp, two 4 mm polyps at transverse, tubulovillous adenoma, surveillance in 3 years.   . COLONOSCOPY N/A 04/12/2016   Dr. Laural Golden: post-polypectomy bleed s/p clips at transverse colon   . GREEN LIGHT LASER TURP (TRANSURETHRAL RESECTION OF PROSTATE N/A 02/13/2014   Procedure: GREEN LIGHT LASER TURP (TRANSURETHRAL RESECTION OF PROSTATE;  Surgeon: Festus Aloe, MD;  Location: [facility];  Service: Urology;  Laterality: N/A;  . OTHER SURGICAL HISTORY     Intestinal blockage x2  . OTHER SURGICAL HISTORY  1964   Football injury  . PACEMAKER INSERTION  03/14/13   SJM Endurity implanted by Dr Truman Hayward in [place] for complete heart block    Current Outpatient Medications  Medication Sig Dispense Refill  . amLODipine (NORVASC) 10 MG tablet TAKE 1 TABLET BY MOUTH EVERY DAY 90 tablet 2  . aspirin 81 MG EC tablet Take 8 mg by mouth daily.    . Ergocalciferol (VITAMIN D2 PO) Take by mouth.    . Magnesium 400 MG TABS Take 1 tablet by mouth daily.    . metoprolol succinate (TOPROL-XL) 25 MG 24 hr tablet TAKE 1/2 TABLET BY MOUTH EVERY DAY 45 tablet 3  . omeprazole (PRILOSEC) 40 MG capsule Take 1 capsule by mouth daily as needed.      No current facility-administered medications for this visit.    Allergies:   Patient has no known allergies.   Social History: Social History  Socioeconomic History  . Marital status: Married    Spouse name: Not on file  . Number of children: Not on file  . Years of education: Not on file  . Highest education level: Not on file  Occupational History  . Not on file  Tobacco Use  . Smoking status: Former Smoker    Packs/day: 1.00    Years: 45.00    Pack years: 45.00    Types: Cigarettes    Start date: 09/29/1966    Quit date: 05/02/2011    Years since quitting: 8.3  . Smokeless tobacco: Never Used  Substance and Sexual Activity  . Alcohol use: No    Alcohol/week: 0.0 standard drinks  . Drug use: No  . Sexual  activity: Not on file  Other Topics Concern  . Not on file  Social History Narrative   Lives in [place] with spouse.   Social Determinants of Health   Financial Resource Strain:   . Difficulty of Paying Living Expenses:   Food Insecurity:   . Worried About Charity fundraiser in the Last Year:   . Arboriculturist in the Last Year:   Transportation Needs:   . Film/video editor (Medical):   Marland Kitchen Lack of Transportation (Non-Medical):   Physical Activity:   . Days of Exercise per Week:   . Minutes of Exercise per Session:   Stress:   . Feeling of Stress :   Social Connections:   . Frequency of Communication with Friends and Family:   . Frequency of Social Gatherings with Friends and Family:   . Attends Religious Services:   . Active Member of Clubs or Organizations:   . Attends Archivist Meetings:   Marland Kitchen Marital Status:   Intimate Partner Violence:   . Fear of Current or Ex-Partner:   . Emotionally Abused:   Marland Kitchen Physically Abused:   . Sexually Abused:     Family History: Family History  Problem Relation Age of Onset  . Cancer Father   . Colon cancer Father     Review of Systems: All other systems reviewed and are otherwise negative except as noted above.  Physical Exam: Vitals:   09/15/19 0806  BP: 132/80  Pulse: 77  Weight: 195 lb (88.5 kg)  Height: 5\' 8"  (1.727 m)     GEN- The patient is well appearing, alert and oriented x 3 today.   HEENT: normocephalic, atraumatic; sclera clear, conjunctiva pink; hearing intact; oropharynx clear; neck supple  Lungs- Clear to ausculation bilaterally, normal work of breathing.  No wheezes, rales, rhonchi Heart- Regular rate and rhythm, no murmurs, rubs or gallops  GI- soft, non-tender, non-distended, bowel sounds present  Extremities- no clubbing, cyanosis, or edema  MS- no significant deformity or atrophy Skin- warm and dry, no rash or lesion; PPM pocket well healed Psych- euthymic mood, full  affect Neuro- strength and sensation are intact  PPM Interrogation- reviewed in detail today,  See PACEART report  EKG:  EKG is ordered today. The ekg ordered today shows NSR at 77 bpm, V paced. Relatively unchanged from last, 10/2017. Known LBBB, QRS 180-190 ms  Recent Labs: No results found for requested labs within last 8760 hours.   Wt Readings from Last 3 Encounters:  09/15/19 195 lb (88.5 kg)  03/07/19 192 lb (87.1 kg)  12/13/18 201 lb (91.2 kg)     Other studies Reviewed: Additional studies/ records that were reviewed today include: Previous EP office notes, Previous remote checks,  Most recent labwork.   Assessment and Plan:  1. CHB s/p St. Jude PPM  Normal PPM function See Claudia Desanctis Art report He does have some noise on RA lead, chronic. No changes today EF normal 10/2014.  2. HTN Stable on current regimen Recent labs drawn by PCP. Cr 1.87 (relatively stable), K 4.1, Mg 1.6, TSH WNL 09/04/2019  3. CAD Denies ischemic symptoms  4. DM2 Recent diagnosis. Started on Metformin by PCP  Current medicines are reviewed at length with the patient today.   The patient does not have concerns regarding his medicines.  The following changes were made today:  none  Labs/ tests ordered today include:  Orders Placed This Encounter  Procedures  . EKG 12-Lead    Disposition:   Follow up with Dr. Rayann Heman for recall in November as scheduled.   Jacalyn Lefevre, PA-C  09/15/2019 8:28 AM  [address] [place] [address] [phone] (office) [phone] (fax)

## 2019-09-15 ENCOUNTER — Ambulatory Visit (INDEPENDENT_AMBULATORY_CARE_PROVIDER_SITE_OTHER): Payer: Medicare Other | Admitting: Student

## 2019-09-15 ENCOUNTER — Other Ambulatory Visit: Payer: Self-pay

## 2019-09-15 VITALS — BP 132/80 | HR 77 | Ht 68.0 in | Wt 195.0 lb

## 2019-09-15 DIAGNOSIS — I442 Atrioventricular block, complete: Secondary | ICD-10-CM

## 2019-09-15 DIAGNOSIS — I2510 Atherosclerotic heart disease of native coronary artery without angina pectoris: Secondary | ICD-10-CM | POA: Diagnosis not present

## 2019-09-15 DIAGNOSIS — Z950 Presence of cardiac pacemaker: Secondary | ICD-10-CM | POA: Diagnosis not present

## 2019-09-15 DIAGNOSIS — I10 Essential (primary) hypertension: Secondary | ICD-10-CM

## 2019-09-15 LAB — CUP PACEART INCLINIC DEVICE CHECK
Battery Remaining Longevity: 108 mo
Battery Voltage: 2.96 V
Brady Statistic RA Percent Paced: 1.7 %
Brady Statistic RV Percent Paced: 99.58 %
Date Time Interrogation Session: 20210517082714
Implantable Lead Implant Date: 20141114
Implantable Lead Implant Date: 20141114
Implantable Lead Location: 753859
Implantable Lead Location: 753860
Implantable Pulse Generator Implant Date: 20141114
Lead Channel Impedance Value: 350 Ohm
Lead Channel Impedance Value: 562.5 Ohm
Lead Channel Pacing Threshold Amplitude: 0.375 V
Lead Channel Pacing Threshold Amplitude: 0.75 V
Lead Channel Pacing Threshold Amplitude: 0.75 V
Lead Channel Pacing Threshold Pulse Width: 0.4 ms
Lead Channel Pacing Threshold Pulse Width: 0.4 ms
Lead Channel Pacing Threshold Pulse Width: 0.4 ms
Lead Channel Sensing Intrinsic Amplitude: 12 mV
Lead Channel Sensing Intrinsic Amplitude: 5 mV
Lead Channel Setting Pacing Amplitude: 2.5 V
Lead Channel Setting Pacing Amplitude: 2.5 V
Lead Channel Setting Pacing Pulse Width: 0.4 ms
Lead Channel Setting Sensing Sensitivity: 4 mV
Pulse Gen Model: 2160
Pulse Gen Serial Number: 7520753

## 2019-09-15 NOTE — Patient Instructions (Addendum)
Medication Instructions:  none *If you need a refill on your cardiac medications before your next appointment, please call your pharmacy*   Lab Work: none If you have labs (blood work) drawn today and your tests are completely normal, you will receive your results only by: Marland Kitchen MyChart Message (if you have MyChart) OR . A paper copy in the mail If you have any lab test that is abnormal or we need to change your treatment, we will call you to review the results.   Testing/Procedures: none   Follow-Up: At [facility], you and your health needs are our priority.  As part of our continuing mission to provide you with exceptional heart care, we have created designated Provider Care Teams.  These Care Teams include your primary Cardiologist (physician) and Advanced Practice Providers (APPs -  Physician Assistants and Nurse Practitioners) who all work together to provide you with the care you need, when you need it.  We recommend signing up for the patient portal called "MyChart".  Sign up information is provided on this After Visit Summary.  MyChart is used to connect with patients for Virtual Visits (Telemedicine).  Patients are able to view lab/test results, encounter notes, upcoming appointments, etc.  Non-urgent messages can be sent to your provider as well.   To learn more about what you can do with MyChart, go to [email or web address].       Other Instructions Remote monitoring is used to monitor your Pacemaker from home. This monitoring reduces the number of office visits required to check your device to one time per year. It allows [place] to keep an eye on the functioning of your device to ensure it is working properly. You are scheduled for a device check from home on 12/01/19. You may send your transmission at any time that day. If you have a wireless device, the transmission will be sent automatically. After your physician reviews your transmission, you will receive a postcard with your  next transmission date.

## 2019-09-16 ENCOUNTER — Ambulatory Visit: Payer: Medicare Other | Admitting: Student

## 2019-11-16 ENCOUNTER — Other Ambulatory Visit: Payer: Self-pay | Admitting: Cardiology

## 2019-12-11 ENCOUNTER — Ambulatory Visit (INDEPENDENT_AMBULATORY_CARE_PROVIDER_SITE_OTHER): Payer: Medicare Other | Admitting: *Deleted

## 2019-12-11 DIAGNOSIS — I442 Atrioventricular block, complete: Secondary | ICD-10-CM

## 2019-12-11 LAB — CUP PACEART REMOTE DEVICE CHECK
Battery Remaining Longevity: 96 mo
Battery Remaining Percentage: 89 %
Battery Voltage: 2.95 V
Brady Statistic AP VP Percent: 6.3 %
Brady Statistic AP VS Percent: 1 %
Brady Statistic AS VP Percent: 94 %
Brady Statistic AS VS Percent: 1 %
Brady Statistic RA Percent Paced: 5.8 %
Brady Statistic RV Percent Paced: 99 %
Date Time Interrogation Session: 20210812131117
Implantable Lead Implant Date: 20141114
Implantable Lead Implant Date: 20141114
Implantable Lead Location: 753859
Implantable Lead Location: 753860
Implantable Pulse Generator Implant Date: 20141114
Lead Channel Impedance Value: 290 Ohm
Lead Channel Impedance Value: 460 Ohm
Lead Channel Pacing Threshold Amplitude: 0.375 V
Lead Channel Pacing Threshold Amplitude: 0.75 V
Lead Channel Pacing Threshold Pulse Width: 0.4 ms
Lead Channel Pacing Threshold Pulse Width: 0.4 ms
Lead Channel Sensing Intrinsic Amplitude: 12 mV
Lead Channel Sensing Intrinsic Amplitude: 3.3 mV
Lead Channel Setting Pacing Amplitude: 2.5 V
Lead Channel Setting Pacing Amplitude: 2.5 V
Lead Channel Setting Pacing Pulse Width: 0.4 ms
Lead Channel Setting Sensing Sensitivity: 4 mV
Pulse Gen Model: 2160
Pulse Gen Serial Number: 7520753

## 2019-12-15 NOTE — Progress Notes (Signed)
Remote pacemaker transmission.   

## 2020-02-01 ENCOUNTER — Other Ambulatory Visit: Payer: Self-pay | Admitting: Cardiology

## 2020-02-13 ENCOUNTER — Other Ambulatory Visit: Payer: Self-pay | Admitting: Cardiology

## 2020-02-17 ENCOUNTER — Other Ambulatory Visit: Payer: Self-pay | Admitting: Cardiology

## 2020-03-05 ENCOUNTER — Ambulatory Visit (INDEPENDENT_AMBULATORY_CARE_PROVIDER_SITE_OTHER): Payer: Medicare Other | Admitting: Internal Medicine

## 2020-03-05 ENCOUNTER — Encounter: Payer: Self-pay | Admitting: Internal Medicine

## 2020-03-05 VITALS — BP 148/82 | HR 78 | Ht 71.0 in | Wt 194.0 lb

## 2020-03-05 DIAGNOSIS — I2510 Atherosclerotic heart disease of native coronary artery without angina pectoris: Secondary | ICD-10-CM | POA: Diagnosis not present

## 2020-03-05 DIAGNOSIS — I442 Atrioventricular block, complete: Secondary | ICD-10-CM | POA: Diagnosis not present

## 2020-03-05 DIAGNOSIS — I10 Essential (primary) hypertension: Secondary | ICD-10-CM

## 2020-03-05 LAB — CUP PACEART INCLINIC DEVICE CHECK
Battery Remaining Longevity: 100 mo
Battery Voltage: 2.95 V
Brady Statistic RA Percent Paced: 6.2 %
Brady Statistic RV Percent Paced: 99.87 %
Date Time Interrogation Session: 20211105142826
Implantable Lead Implant Date: 20141114
Implantable Lead Implant Date: 20141114
Implantable Lead Location: 753859
Implantable Lead Location: 753860
Implantable Pulse Generator Implant Date: 20141114
Lead Channel Impedance Value: 337.5 Ohm
Lead Channel Impedance Value: 487.5 Ohm
Lead Channel Pacing Threshold Amplitude: 0.375 V
Lead Channel Pacing Threshold Amplitude: 0.5 V
Lead Channel Pacing Threshold Amplitude: 0.75 V
Lead Channel Pacing Threshold Amplitude: 0.75 V
Lead Channel Pacing Threshold Pulse Width: 0.4 ms
Lead Channel Pacing Threshold Pulse Width: 0.4 ms
Lead Channel Pacing Threshold Pulse Width: 0.4 ms
Lead Channel Pacing Threshold Pulse Width: 0.4 ms
Lead Channel Sensing Intrinsic Amplitude: 12 mV
Lead Channel Sensing Intrinsic Amplitude: 5 mV
Lead Channel Setting Pacing Amplitude: 1.375
Lead Channel Setting Pacing Amplitude: 2.5 V
Lead Channel Setting Pacing Pulse Width: 0.4 ms
Lead Channel Setting Sensing Sensitivity: 4 mV
Pulse Gen Model: 2160
Pulse Gen Serial Number: 7520753

## 2020-03-05 NOTE — Progress Notes (Signed)
PCP: Jacqualine Code, DO   Primary EP:  Dr Rayann Heman  Troy Petty is a 69 y.o. male who presents today for routine electrophysiology followup.  Since last being seen in our clinic, the patient reports doing very well.  Today, he denies symptoms of palpitations, chest pain, shortness of breath,  lower extremity edema, dizziness, presyncope, or syncope.  The patient is otherwise without complaint today.   Past Medical History:  Diagnosis Date  . Arthritis    MIDDLE FINGER AND RING FINGER BOTH HANDS  . Chronic kidney disease, stage III (moderate) (HCC)   . Chronic renal insufficiency    baseline creatinine is 1.8  . Chronic systolic dysfunction of left ventricle 4/15   EF 40-45% in setting of PMT on limited echo  . Complete heart block ([place])   . COPD (chronic obstructive pulmonary disease) ([place])   . Coronary artery disease    NON OBSTRUCTIVE CAD; DR. Harl Bowie WITH [facility] IS PT'S CARDIOLOGIST  . GERD (gastroesophageal reflux disease)   . Hemorrhoids   . Hypertension   . Hypertrophy of prostate with urinary obstruction and other lower urinary tract symptoms (LUTS)    INDWELLING FOLEY CATHETER  . Metabolic syndrome   . Mild pulmonary hypertension ([place])   . Pacemaker    IMPLANTED Mar 14, 2013 - DR. Ramsey Midgett WITH [facility] FOLLOWS DEVICE FUNCTIONING  . Pain    BACK PAIN AT TIMES  . Shortness of breath    WITH EXERTION  . Vitamin B12 deficiency    Past Surgical History:  Procedure Laterality Date  . CARDIAC CATHETERIZATION  2014  . COLON SURGERY     COLON RESECTION FOR BLOCKAGE  . COLONOSCOPY N/A 04/12/2016   Dr. Gala Romney: 10 mm polyp, two 4 mm polyps at transverse, tubulovillous adenoma, surveillance in 3 years.   . COLONOSCOPY N/A 04/12/2016   Dr. Laural Golden: post-polypectomy bleed s/p clips at transverse colon   . GREEN LIGHT LASER TURP (TRANSURETHRAL RESECTION OF PROSTATE N/A 02/13/2014   Procedure: GREEN LIGHT LASER TURP (TRANSURETHRAL RESECTION OF PROSTATE;   Surgeon: Festus Aloe, MD;  Location: [facility];  Service: Urology;  Laterality: N/A;  . OTHER SURGICAL HISTORY     Intestinal blockage x2  . OTHER SURGICAL HISTORY  1964   Football injury  . PACEMAKER INSERTION  03/14/13   SJM Endurity implanted by Dr Truman Hayward in [place] for complete heart block    ROS- all systems are reviewed and negative except as per HPI above  Current Outpatient Medications  Medication Sig Dispense Refill  . aspirin 81 MG EC tablet Take 8 mg by mouth daily.    . Ergocalciferol (VITAMIN D2 PO) Take by mouth.    . losartan (COZAAR) 50 MG tablet Take 50 mg by mouth daily.    . Magnesium 400 MG TABS Take 1 tablet by mouth daily.    . metoprolol succinate (TOPROL-XL) 25 MG 24 hr tablet TAKE 1/2 TABLET BY MOUTH EVERY DAY 45 tablet 0  . omeprazole (PRILOSEC) 40 MG capsule Take 1 capsule by mouth daily as needed.      No current facility-administered medications for this visit.    Physical Exam: Vitals:   03/05/20 0854  BP: (!) 148/82  Pulse: 78  SpO2: 92%  Weight: 194 lb (88 kg)  Height: 5\' 11"  (1.803 m)    GEN- The patient is well appearing, alert and oriented x 3 today.   Head- normocephalic, atraumatic Eyes-  Sclera clear, conjunctiva pink Ears-  hearing intact Oropharynx- clear Lungs- Clear to ausculation bilaterally, normal work of breathing Chest- pacemaker pocket is well healed Heart- Regular rate and rhythm, no murmurs, rubs or gallops, PMI not laterally displaced GI- soft, NT, ND, + BS Extremities- no clubbing, cyanosis, or edema  Pacemaker interrogation- reviewed in detail today,  See PACEART report   Assessment and Plan:  1. Symptomatic complete heart block Normal pacemaker function See Pace Art report No changes today Rare RA noise is noted chronically he is device dependant today  2. HTn Stable No change required today  3. CAD No ischemic symptoms No changes  Risks, benefits and potential toxicities for medications prescribed  and/or refilled reviewed with patient today.   Thompson Grayer MD, [facility] 03/05/2020 9:26 AM

## 2020-03-05 NOTE — Patient Instructions (Addendum)
Medication Instructions:   Your physician recommends that you continue on your current medications as directed. Please refer to the Current Medication list given to you today.  Labwork:  None  Testing/Procedures:  None  Follow-Up:  Your physician recommends that you schedule a follow-up appointment in: 1 year with Dr. Rayann Heman.  Any Other Special Instructions Will Be Listed Below (If Applicable).  If you need a refill on your cardiac medications before your next appointment, please call your pharmacy.

## 2020-03-31 ENCOUNTER — Encounter: Payer: Self-pay | Admitting: Cardiology

## 2020-04-22 ENCOUNTER — Encounter: Payer: Self-pay | Admitting: *Deleted

## 2020-04-22 ENCOUNTER — Encounter: Payer: Self-pay | Admitting: Cardiology

## 2020-04-22 ENCOUNTER — Ambulatory Visit (INDEPENDENT_AMBULATORY_CARE_PROVIDER_SITE_OTHER): Payer: Medicare Other | Admitting: Cardiology

## 2020-04-22 VITALS — BP 140/90 | HR 67 | Ht 71.0 in | Wt 188.0 lb

## 2020-04-22 DIAGNOSIS — Z8679 Personal history of other diseases of the circulatory system: Secondary | ICD-10-CM

## 2020-04-22 DIAGNOSIS — I2510 Atherosclerotic heart disease of native coronary artery without angina pectoris: Secondary | ICD-10-CM

## 2020-04-22 DIAGNOSIS — I442 Atrioventricular block, complete: Secondary | ICD-10-CM | POA: Diagnosis not present

## 2020-04-22 DIAGNOSIS — I10 Essential (primary) hypertension: Secondary | ICD-10-CM | POA: Diagnosis not present

## 2020-04-22 MED ORDER — AMLODIPINE BESYLATE 5 MG PO TABS
5.0000 mg | ORAL_TABLET | Freq: Every day | ORAL | 1 refills | Status: DC
Start: 2020-04-22 — End: 2020-05-21

## 2020-04-22 NOTE — Patient Instructions (Signed)
Your physician wants you to follow-up in: [place] will receive a reminder letter in the mail two months in advance. If you don't receive a letter, please call our office to schedule the follow-up appointment.  Your physician has recommended you make the following change in your medication:   START AMLODIPINE 5 MG DAILY   CHECK BLOOD PRESSURE FOR 1 WEEK AND CALL us WITH READINGS  Thank you for choosing [facility]!!
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
## 2020-04-30 ENCOUNTER — Other Ambulatory Visit: Payer: Self-pay | Admitting: Cardiology

## 2020-05-04 ENCOUNTER — Telehealth: Payer: Self-pay

## 2020-05-04 NOTE — Telephone Encounter (Signed)
Patient faxed BP results to Dr.Branch and placed on his desk at the [facility] office.

## 2020-05-19 ENCOUNTER — Telehealth: Payer: Self-pay | Admitting: Cardiology

## 2020-05-19 NOTE — Telephone Encounter (Signed)
Patient called to check on the status of his BP readings that he dropped off a few weeks ago.

## 2020-05-21 MED ORDER — AMLODIPINE BESYLATE 10 MG PO TABS: 10.0000 mg | ORAL_TABLET | Freq: Every day | ORAL | 3 refills | Status: DC

## 2020-05-21 NOTE — Telephone Encounter (Signed)
BP's running high, can he increase norvasc to 10mg  daily  J Zyla Dascenzo MD

## 2020-05-21 NOTE — Telephone Encounter (Signed)
Patient informed and verbalized understanding of plan. 

## 2020-05-26 ENCOUNTER — Ambulatory Visit: Payer: Medicare Other | Admitting: Cardiology

## 2020-06-10 ENCOUNTER — Ambulatory Visit (INDEPENDENT_AMBULATORY_CARE_PROVIDER_SITE_OTHER): Payer: Medicare Other

## 2020-06-10 DIAGNOSIS — I442 Atrioventricular block, complete: Secondary | ICD-10-CM

## 2020-06-11 LAB — CUP PACEART REMOTE DEVICE CHECK
Battery Remaining Longevity: 102 mo
Battery Remaining Percentage: 89 %
Battery Voltage: 2.95 V
Brady Statistic AP VP Percent: 8.9 %
Brady Statistic AP VS Percent: 1 %
Brady Statistic AS VP Percent: 91 %
Brady Statistic AS VS Percent: 1 %
Brady Statistic RA Percent Paced: 8.3 %
Brady Statistic RV Percent Paced: 99 %
Date Time Interrogation Session: 20220211104437
Implantable Lead Implant Date: 20141114
Implantable Lead Implant Date: 20141114
Implantable Lead Location: 753859
Implantable Lead Location: 753860
Implantable Pulse Generator Implant Date: 20141114
Lead Channel Impedance Value: 300 Ohm
Lead Channel Impedance Value: 480 Ohm
Lead Channel Pacing Threshold Amplitude: 0.375 V
Lead Channel Pacing Threshold Amplitude: 0.75 V
Lead Channel Pacing Threshold Pulse Width: 0.4 ms
Lead Channel Pacing Threshold Pulse Width: 0.4 ms
Lead Channel Sensing Intrinsic Amplitude: 12 mV
Lead Channel Sensing Intrinsic Amplitude: 4.9 mV
Lead Channel Setting Pacing Amplitude: 1.375
Lead Channel Setting Pacing Amplitude: 2.5 V
Lead Channel Setting Pacing Pulse Width: 0.4 ms
Lead Channel Setting Sensing Sensitivity: 4 mV
Pulse Gen Model: 2160
Pulse Gen Serial Number: 7520753

## 2020-06-16 NOTE — Progress Notes (Signed)
Remote pacemaker transmission.   

## 2020-09-09 ENCOUNTER — Ambulatory Visit (INDEPENDENT_AMBULATORY_CARE_PROVIDER_SITE_OTHER): Payer: Medicare Other

## 2020-09-09 DIAGNOSIS — I442 Atrioventricular block, complete: Secondary | ICD-10-CM | POA: Diagnosis not present

## 2020-09-10 ENCOUNTER — Telehealth: Payer: Self-pay | Admitting: Internal Medicine

## 2020-09-10 LAB — CUP PACEART REMOTE DEVICE CHECK
Battery Remaining Longevity: 90 mo
Battery Remaining Percentage: 80 %
Battery Voltage: 2.93 V
Brady Statistic AP VP Percent: 7.9 %
Brady Statistic AP VS Percent: 1 %
Brady Statistic AS VP Percent: 92 %
Brady Statistic AS VS Percent: 1 %
Brady Statistic RA Percent Paced: 7.2 %
Brady Statistic RV Percent Paced: 99 %
Date Time Interrogation Session: 20220513094708
Implantable Lead Implant Date: 20141114
Implantable Lead Implant Date: 20141114
Implantable Lead Location: 753859
Implantable Lead Location: 753860
Implantable Pulse Generator Implant Date: 20141114
Lead Channel Impedance Value: 280 Ohm
Lead Channel Impedance Value: 460 Ohm
Lead Channel Pacing Threshold Amplitude: 0.5 V
Lead Channel Pacing Threshold Amplitude: 0.75 V
Lead Channel Pacing Threshold Pulse Width: 0.4 ms
Lead Channel Pacing Threshold Pulse Width: 0.4 ms
Lead Channel Sensing Intrinsic Amplitude: 12 mV
Lead Channel Sensing Intrinsic Amplitude: 3.6 mV
Lead Channel Setting Pacing Amplitude: 1.5 V
Lead Channel Setting Pacing Amplitude: 2.5 V
Lead Channel Setting Pacing Pulse Width: 0.4 ms
Lead Channel Setting Sensing Sensitivity: 4 mV
Pulse Gen Model: 2160
Pulse Gen Serial Number: 7520753

## 2020-09-10 NOTE — Telephone Encounter (Signed)
Patient states he is returning a call from today. I did not see any notes. 

## 2020-09-10 NOTE — Telephone Encounter (Signed)
The patient needed help sending a transmission. Transmission received.

## 2020-10-01 NOTE — Progress Notes (Signed)
Remote pacemaker transmission.   

## 2020-10-20 ENCOUNTER — Ambulatory Visit (INDEPENDENT_AMBULATORY_CARE_PROVIDER_SITE_OTHER): Payer: Medicare Other | Admitting: Cardiology

## 2020-10-20 ENCOUNTER — Other Ambulatory Visit: Payer: Self-pay | Admitting: Cardiology

## 2020-10-20 ENCOUNTER — Other Ambulatory Visit: Payer: Self-pay

## 2020-10-20 ENCOUNTER — Encounter: Payer: Self-pay | Admitting: Cardiology

## 2020-10-20 VITALS — BP 145/88 | HR 73 | Ht 71.0 in | Wt 193.0 lb

## 2020-10-20 DIAGNOSIS — Z950 Presence of cardiac pacemaker: Secondary | ICD-10-CM | POA: Diagnosis not present

## 2020-10-20 DIAGNOSIS — Z8679 Personal history of other diseases of the circulatory system: Secondary | ICD-10-CM | POA: Diagnosis not present

## 2020-10-20 DIAGNOSIS — I2510 Atherosclerotic heart disease of native coronary artery without angina pectoris: Secondary | ICD-10-CM

## 2020-10-20 DIAGNOSIS — I10 Essential (primary) hypertension: Secondary | ICD-10-CM | POA: Diagnosis not present

## 2020-10-20 MED ORDER — CARVEDILOL 3.125 MG PO TABS: 3.1250 mg | ORAL_TABLET | Freq: Two times a day (BID) | ORAL | 2 refills | Status: DC

## 2020-10-20 NOTE — Progress Notes (Signed)
Clinical Summary Mr. Enriquez is a 70 y.o.maleseen today for follow up of the following medical problems.   1. History of cardiomyopathy/ Low normal to mildly decreased LV systolic function   - echo 09/2013 LVEF 52-84%, grade I diastolic dysfunction. Apex appears to be hypokinetic though poor visualization   - prior cath in [facility] 10/2012 with only mid LAD 20% lesion, otherwise patent vessels.   - repeat echo 10/2014 with LVEF 60-65%, no WMAs. - Toprol decreased during EP visit due to mild SOB. Breathing improved lower dose Toprol.     - chronic SOB. No recent edema - compliant with meds     2. Non-obstructive CAD   - mild LAD lesion from cath 2014    no recent chest pain    3. Complete heart block   - St Jude pacemaker followed by Dr Rayann Heman   - normal device check 03/2020  08/2020 normal device function - no recent symptoms.      4. HTN   - reports neprhologist  started losartan 50mg  daily  - home bp's 140s-150s/70s-80s - compliant with meds    5. AAA screen - 01/2017 no AAA            Past Medical History:  Diagnosis Date   Arthritis    MIDDLE FINGER AND RING FINGER BOTH HANDS   Chronic kidney disease, stage III (moderate) (HCC)    Chronic renal insufficiency    baseline creatinine is 1.8   Chronic systolic dysfunction of left ventricle 4/15   EF 40-45% in setting of PMT on limited echo   Complete heart block (HCC)    COPD (chronic obstructive pulmonary disease) ([place])    Coronary artery disease    NON OBSTRUCTIVE CAD; DR. Harl Bowie WITH [facility] IS PT'S CARDIOLOGIST   GERD (gastroesophageal reflux disease)    Hemorrhoids    Hypertension    Hypertrophy of prostate with urinary obstruction and other lower urinary tract symptoms (LUTS)    INDWELLING FOLEY CATHETER   Metabolic syndrome    Mild pulmonary hypertension ([place])    Pacemaker    IMPLANTED Mar 14, 2013 - DR. ALLRED WITH [facility] FOLLOWS DEVICE FUNCTIONING   Pain    BACK  PAIN AT TIMES   Shortness of breath    WITH EXERTION   Vitamin B12 deficiency      No Known Allergies   Current Outpatient Medications  Medication Sig Dispense Refill   amLODipine (NORVASC) 10 MG tablet Take 1 tablet (10 mg total) by mouth daily. 90 tablet 3   aspirin 81 MG EC tablet Take 8 mg by mouth daily.     Ergocalciferol (VITAMIN D2 PO) Take by mouth.     losartan (COZAAR) 50 MG tablet Take 50 mg by mouth daily.     Magnesium 400 MG TABS Take 1 tablet by mouth daily.     metFORMIN (GLUCOPHAGE) 500 MG tablet Take by mouth 2 (two) times daily with a meal.     metoprolol succinate (TOPROL-XL) 25 MG 24 hr tablet TAKE 1/2 TABLET BY MOUTH EVERY DAY 45 tablet 6   omeprazole (PRILOSEC) 40 MG capsule Take 1 capsule by mouth daily as needed.      No current facility-administered medications for this visit.     Past Surgical History:  Procedure Laterality Date   CARDIAC CATHETERIZATION  2014   COLON SURGERY     COLON RESECTION FOR BLOCKAGE   COLONOSCOPY N/A 04/12/2016   Dr.  Rourk: 10 mm polyp, two 4 mm polyps at transverse, tubulovillous adenoma, surveillance in 3 years.    COLONOSCOPY N/A 04/12/2016   Dr. Laural Golden: post-polypectomy bleed s/p clips at transverse colon    GREEN LIGHT LASER TURP (TRANSURETHRAL RESECTION OF PROSTATE N/A 02/13/2014   Procedure: GREEN LIGHT LASER TURP (TRANSURETHRAL RESECTION OF PROSTATE;  Surgeon: Festus Aloe, MD;  Location: [facility];  Service: Urology;  Laterality: N/A;   OTHER SURGICAL HISTORY     Intestinal blockage x2   OTHER SURGICAL HISTORY  1964   Football injury   PACEMAKER INSERTION  03/14/13   SJM Endurity implanted by Dr Truman Hayward in [place] for complete heart block     No Known Allergies    Family History  Problem Relation Age of Onset   Cancer Father    Colon cancer Father      Social History Mr. Cutting reports that he quit smoking about 9 years ago. His smoking use included cigarettes. He started smoking about 54 years  ago. He has a 45.00 pack-year smoking history. He has never used smokeless tobacco. Mr. Chelf reports no history of alcohol use.   Review of Systems CONSTITUTIONAL: No weight loss, fever, chills, weakness or fatigue.  HEENT: Eyes: No visual loss, blurred vision, double vision or yellow sclerae.No hearing loss, sneezing, congestion, runny nose or sore throat.  SKIN: No rash or itching.  CARDIOVASCULAR: per hpi RESPIRATORY: No shortness of breath, cough or sputum.  GASTROINTESTINAL: No anorexia, nausea, vomiting or diarrhea. No abdominal pain or blood.  GENITOURINARY: No burning on urination, no polyuria NEUROLOGICAL: No headache, dizziness, syncope, paralysis, ataxia, numbness or tingling in the extremities. No change in bowel or bladder control.  MUSCULOSKELETAL: No muscle, back pain, joint pain or stiffness.  LYMPHATICS: No enlarged nodes. No history of splenectomy.  PSYCHIATRIC: No history of depression or anxiety.  ENDOCRINOLOGIC: No reports of sweating, cold or heat intolerance. No polyuria or polydipsia.  Marland Kitchen   Physical Examination Today's Vitals   10/20/20 1146  BP: (!) 145/88  Pulse: 73  Weight: 193 lb (87.5 kg)  Height: 5\' 11"  (1.803 m)   Body mass index is 26.92 kg/m.  Gen: resting comfortably, no acute distress HEENT: no scleral icterus, pupils equal round and reactive, no palptable cervical adenopathy,  CV: RRR, no m/r/g, no jvd Resp: Clear to auscultation bilaterally GI: abdomen is soft, non-tender, non-distended, normal bowel sounds, no hepatosplenomegaly MSK: extremities are warm, no edema.  Skin: warm, no rash Neuro:  no focal deficits Psych: appropriate affect   Diagnostic Studies 09/2013 Echo   Study Conclusions  - Left ventricle: The cavity size was normal. Wall thickness was normal. Systolic function was mildly reduced. The estimated ejection fraction was in the range of 45% to 50%. Doppler parameters are consistent with abnormal left  ventricular relaxation (grade 1 diastolic dysfunction). - Regional wall motion abnormality: Several of the views are foreshortened, however overall the apex does appear hypokinetic. Hypokinesis of the apical anterior, apical inferior, apical septal, apical lateral, and apical myocardium. Can consider echo with contrast for more definitive evaluation of wall motion. - Aortic valve: Mildly calcified annulus. Trileaflet; mildly thickened leaflets. Valve area (VTI): 3.76 cm^2. Valve area (Vmax): 3.65 cm^2. - Mitral valve: Mildly calcified annulus. Mildly thickened leaflets . 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 - Systemic veins: The IVC is small, suggesting low RA pressures and hypovolemia.    10/2012 Cath [facility]   RHC: RA 5, PA 40/12 (mean not reported) PCWP  12, PA sat 75%.   LHC: LM patent, LAD mid 20%, LCX patent, RCA anomolous origine from left cusp patent. LVEF 70%.     10/2014 echo Study Conclusions   - Left ventricle: The cavity size was normal. Wall thickness was   increased in a pattern of mild LVH. Systolic function was normal.   The estimated ejection fraction was in the range of 60% to 65%.   Wall motion was normal; there were no regional wall motion   abnormalities. Doppler parameters are consistent with abnormal   left ventricular relaxation (grade 1 diastolic dysfunction). - Aortic valve: Mildly calcified annulus. Trileaflet; mildly   thickened leaflets. Valve area (VTI): 4.75 cm^2. Valve area   (Vmax): 3.73 cm^2. Valve area (Vmean): 3.54 cm^2. - Mitral valve: Mildly calcified annulus. Mildly thickened leaflets   . - Left atrium: The atrium was mildly dilated. - Atrial septum: No defect or patent foramen ovale was identified. - Pulmonary arteries: Systolic pressure was moderately increased.   PA peak pressure: 44 mm Hg (S). - Technically adequate study.      Assessment and Plan  1. History of cardiomyopathy - LVEF has now  normalized - no symptoms, continue current meds   2. Non-obstructive CAD   - cath with mild LAD lesion - no recent symptoms, continue current meds     3. HTN   - above goal, will change toprol to coreg for more bp effects, titrate as needed.    4. Complete heart block -recent normal device check, no symptoms - continue to monitor.         Arnoldo Lenis, M.D.

## 2020-10-20 NOTE — Patient Instructions (Addendum)
Medication Instructions:  Your physician has recommended you make the following change in your medication: Stop metoprolol Start carvedilol 3.125 mg by mouth twice daily Continue other medications the same  Labwork: none  Testing/Procedures: none  Follow-Up: Your physician recommends that you schedule a follow-up appointment in: 6 months.  Any Other Special Instructions Will Be Listed Below (If Applicable). Your physician has requested that you regularly monitor and record your blood pressure readings at home. Please use the same machine at the same time of day to check your readings and record them. Call office in one week with you readings.  If you need a refill on your cardiac medications before your next appointment, please call your pharmacy.

## 2020-12-02 ENCOUNTER — Telehealth: Payer: Self-pay | Admitting: *Deleted

## 2020-12-02 NOTE — Telephone Encounter (Signed)
Laurine Blazer, LPN  [id number]  [id number] AM EDT Back to Top     Patient notified and verbalized understanding.

## 2020-12-02 NOTE — Telephone Encounter (Signed)
-----   Message from Arnoldo Lenis, MD sent at 11/29/2020  4:19 PM EDT ----- Some up and down bp's but overall at goal, no changes  J BrancH MD

## 2020-12-06 ENCOUNTER — Ambulatory Visit: Payer: Medicare Other | Admitting: Cardiology

## 2020-12-15 ENCOUNTER — Ambulatory Visit (INDEPENDENT_AMBULATORY_CARE_PROVIDER_SITE_OTHER): Payer: Medicare Other

## 2020-12-15 DIAGNOSIS — Z8679 Personal history of other diseases of the circulatory system: Secondary | ICD-10-CM | POA: Diagnosis not present

## 2020-12-15 LAB — CUP PACEART REMOTE DEVICE CHECK
Battery Remaining Longevity: 24 mo
Battery Remaining Percentage: 21 %
Battery Voltage: 2.92 V
Brady Statistic AP VP Percent: 7.1 %
Brady Statistic AP VS Percent: 1 %
Brady Statistic AS VP Percent: 92 %
Brady Statistic AS VS Percent: 1 %
Brady Statistic RA Percent Paced: 6.3 %
Brady Statistic RV Percent Paced: 99 %
Date Time Interrogation Session: 20220817120755
Implantable Lead Implant Date: 20141114
Implantable Lead Implant Date: 20141114
Implantable Lead Location: 753859
Implantable Lead Location: 753860
Implantable Pulse Generator Implant Date: 20141114
Lead Channel Impedance Value: 290 Ohm
Lead Channel Impedance Value: 480 Ohm
Lead Channel Pacing Threshold Amplitude: 0.5 V
Lead Channel Pacing Threshold Amplitude: 0.75 V
Lead Channel Pacing Threshold Pulse Width: 0.4 ms
Lead Channel Pacing Threshold Pulse Width: 0.4 ms
Lead Channel Sensing Intrinsic Amplitude: 12 mV
Lead Channel Sensing Intrinsic Amplitude: 3.1 mV
Lead Channel Setting Pacing Amplitude: 1.5 V
Lead Channel Setting Pacing Amplitude: 2.5 V
Lead Channel Setting Pacing Pulse Width: 0.4 ms
Lead Channel Setting Sensing Sensitivity: 4 mV
Pulse Gen Model: 2160
Pulse Gen Serial Number: 7520753

## 2021-01-04 NOTE — Progress Notes (Signed)
Remote pacemaker transmission.   

## 2021-03-04 ENCOUNTER — Ambulatory Visit (INDEPENDENT_AMBULATORY_CARE_PROVIDER_SITE_OTHER): Payer: Medicare Other | Admitting: Internal Medicine

## 2021-03-04 VITALS — BP 132/76 | HR 74 | Ht 71.0 in | Wt 192.0 lb

## 2021-03-04 DIAGNOSIS — I10 Essential (primary) hypertension: Secondary | ICD-10-CM

## 2021-03-04 DIAGNOSIS — I2510 Atherosclerotic heart disease of native coronary artery without angina pectoris: Secondary | ICD-10-CM

## 2021-03-04 DIAGNOSIS — I442 Atrioventricular block, complete: Secondary | ICD-10-CM | POA: Diagnosis not present

## 2021-03-04 DIAGNOSIS — R0602 Shortness of breath: Secondary | ICD-10-CM | POA: Diagnosis not present

## 2021-03-04 LAB — CUP PACEART INCLINIC DEVICE CHECK
Battery Remaining Longevity: 21 mo
Battery Voltage: 2.9 V
Brady Statistic RA Percent Paced: 5.6 %
Brady Statistic RV Percent Paced: 99.65 %
Date Time Interrogation Session: 20221104093908
Implantable Lead Implant Date: 20141114
Implantable Lead Implant Date: 20141114
Implantable Lead Location: 753859
Implantable Lead Location: 753860
Implantable Pulse Generator Implant Date: 20141114
Lead Channel Impedance Value: 337.5 Ohm
Lead Channel Impedance Value: 475 Ohm
Lead Channel Pacing Threshold Amplitude: 0.5 V
Lead Channel Pacing Threshold Amplitude: 1 V
Lead Channel Pacing Threshold Pulse Width: 0.4 ms
Lead Channel Pacing Threshold Pulse Width: 0.4 ms
Lead Channel Sensing Intrinsic Amplitude: 12 mV
Lead Channel Sensing Intrinsic Amplitude: 5 mV
Lead Channel Setting Pacing Amplitude: 1.5 V
Lead Channel Setting Pacing Amplitude: 2.5 V
Lead Channel Setting Pacing Pulse Width: 0.4 ms
Lead Channel Setting Sensing Sensitivity: 4 mV
Pulse Gen Model: 2160
Pulse Gen Serial Number: 7520753

## 2021-03-04 NOTE — Patient Instructions (Signed)
Medication Instructions:  Continue all current medications.  Labwork: none  Testing/Procedures: Your physician has requested that you have an echocardiogram. Echocardiography is a painless test that uses sound waves to create images of your heart. It provides your doctor with information about the size and shape of your heart and how well your heart's chambers and valves are working. This procedure takes approximately one hour. There are no restrictions for this procedure. Office will contact with results via phone or letter.     Follow-Up: 1 year - Dr.  Rayann Heman   Any Other Special Instructions Will Be Listed Below (If Applicable).   If you need a refill on your cardiac medications before your next appointment, please call your pharmacy.

## 2021-03-04 NOTE — Progress Notes (Signed)
PCP: Jacqualine Code, DO   Primary EP:  Dr Rayann Heman  Troy Petty is a 70 y.o. male who presents today for routine electrophysiology followup.  Since last being seen in our clinic, the patient reports doing reasonably well. He is not very active.  He has SOB with moderate activity.  This is chronic. Today, he denies symptoms of palpitations, chest pain,  lower extremity edema, dizziness, presyncope, or syncope.  The patient is otherwise without complaint today.   Past Medical History:  Diagnosis Date   Arthritis    MIDDLE FINGER AND RING FINGER BOTH HANDS   Chronic kidney disease, stage III (moderate) (HCC)    Chronic renal insufficiency    baseline creatinine is 1.8   Chronic systolic dysfunction of left ventricle 4/15   EF 40-45% in setting of PMT on limited echo   Complete heart block (HCC)    COPD (chronic obstructive pulmonary disease) ([place])    Coronary artery disease    NON OBSTRUCTIVE CAD; DR. Harl Bowie WITH [facility] IS PT'S CARDIOLOGIST   GERD (gastroesophageal reflux disease)    Hemorrhoids    Hypertension    Hypertrophy of prostate with urinary obstruction and other lower urinary tract symptoms (LUTS)    INDWELLING FOLEY CATHETER   Metabolic syndrome    Mild pulmonary hypertension ([place])    Pacemaker    IMPLANTED Mar 14, 2013 - DR. Tania Steinhauser WITH [facility] FOLLOWS DEVICE FUNCTIONING   Pain    BACK PAIN AT TIMES   Shortness of breath    WITH EXERTION   Vitamin B12 deficiency    Past Surgical History:  Procedure Laterality Date   CARDIAC CATHETERIZATION  2014   COLON SURGERY     COLON RESECTION FOR BLOCKAGE   COLONOSCOPY N/A 04/12/2016   Dr. Gala Romney: 10 mm polyp, two 4 mm polyps at transverse, tubulovillous adenoma, surveillance in 3 years.    COLONOSCOPY N/A 04/12/2016   Dr. Laural Golden: post-polypectomy bleed s/p clips at transverse colon    GREEN LIGHT LASER TURP (TRANSURETHRAL RESECTION OF PROSTATE N/A 02/13/2014   Procedure: GREEN LIGHT LASER TURP  (TRANSURETHRAL RESECTION OF PROSTATE;  Surgeon: Festus Aloe, MD;  Location: [facility];  Service: Urology;  Laterality: N/A;   OTHER SURGICAL HISTORY     Intestinal blockage x2   OTHER SURGICAL HISTORY  1964   Football injury   PACEMAKER INSERTION  03/14/13   SJM Endurity implanted by Dr Truman Hayward in [place] for complete heart block    ROS- all systems are reviewed and negative except as per HPI above  Current Outpatient Medications  Medication Sig Dispense Refill   aspirin 81 MG EC tablet Take 8 mg by mouth daily.     carvedilol (COREG) 3.125 MG tablet Take 1 tablet (3.125 mg total) by mouth 2 (two) times daily. 180 tablet 2   Ergocalciferol (VITAMIN D2 PO) Take by mouth.     losartan (COZAAR) 50 MG tablet Take 50 mg by mouth daily.     Magnesium 400 MG TABS Take 1 tablet by mouth daily.     metFORMIN (GLUCOPHAGE) 500 MG tablet Take by mouth 2 (two) times daily with a meal.     omeprazole (PRILOSEC) 40 MG capsule Take 1 capsule by mouth daily as needed.      amLODipine (NORVASC) 10 MG tablet Take 1 tablet (10 mg total) by mouth daily. 90 tablet 3   No current facility-administered medications for this visit.    Physical Exam: Vitals:  03/04/21 0923  BP: 132/76  Pulse: 74  SpO2: 92%  Weight: 192 lb (87.1 kg)  Height: 5\' 11"  (1.803 m)    GEN- The patient is well appearing, alert and oriented x 3 today.   Head- normocephalic, atraumatic Eyes-  Sclera clear, conjunctiva pink Ears- hearing intact Oropharynx- clear Lungs- Clear to ausculation bilaterally, normal work of breathing Chest- pacemaker pocket is well healed Heart- Regular rate and rhythm, no murmurs, rubs or gallops, PMI not laterally displaced GI- soft, NT, ND, + BS Extremities- no clubbing, cyanosis, or edema  Pacemaker interrogation- reviewed in detail today,  See PACEART report  ekg tracing ordered today is personally reviewed and shows sinus with V pacing  Assessment and Plan:  1. Symptomatic complete  heart block Normal pacemaker function See Pace Art report No changes today Chronic RA noise is noted he is device dependant today  2. CAD (nonobstructive) No ischemic symptoms No changes  3. HTN Stable No change required today  4. Chronic diastolic dysfunction EF normal by echo 2016 Clinically stable Follows with Dr Harl Bowie Given ongoing exertional SOB, will update echo at this time  Return in a year  Thompson Grayer MD, [facility] 03/04/2021 9:30 AM

## 2021-03-16 ENCOUNTER — Ambulatory Visit (INDEPENDENT_AMBULATORY_CARE_PROVIDER_SITE_OTHER): Payer: Medicare Other

## 2021-03-16 DIAGNOSIS — I442 Atrioventricular block, complete: Secondary | ICD-10-CM | POA: Diagnosis not present

## 2021-03-17 LAB — CUP PACEART REMOTE DEVICE CHECK
Battery Remaining Longevity: 20 mo
Battery Remaining Percentage: 18 %
Battery Voltage: 2.9 V
Brady Statistic AP VP Percent: 4.2 %
Brady Statistic AP VS Percent: 1 %
Brady Statistic AS VP Percent: 96 %
Brady Statistic AS VS Percent: 1 %
Brady Statistic RA Percent Paced: 3.8 %
Brady Statistic RV Percent Paced: 99 %
Date Time Interrogation Session: 20221117115742
Implantable Lead Implant Date: 20141114
Implantable Lead Implant Date: 20141114
Implantable Lead Location: 753859
Implantable Lead Location: 753860
Implantable Pulse Generator Implant Date: 20141114
Lead Channel Impedance Value: 290 Ohm
Lead Channel Impedance Value: 460 Ohm
Lead Channel Pacing Threshold Amplitude: 0.375 V
Lead Channel Pacing Threshold Amplitude: 1 V
Lead Channel Pacing Threshold Pulse Width: 0.4 ms
Lead Channel Pacing Threshold Pulse Width: 0.4 ms
Lead Channel Sensing Intrinsic Amplitude: 12 mV
Lead Channel Sensing Intrinsic Amplitude: 5 mV
Lead Channel Setting Pacing Amplitude: 1.375
Lead Channel Setting Pacing Amplitude: 2.5 V
Lead Channel Setting Pacing Pulse Width: 0.4 ms
Lead Channel Setting Sensing Sensitivity: 4 mV
Pulse Gen Model: 2160
Pulse Gen Serial Number: 7520753

## 2021-03-23 NOTE — Progress Notes (Signed)
Remote pacemaker transmission.   

## 2021-04-27 ENCOUNTER — Other Ambulatory Visit: Payer: Self-pay

## 2021-04-27 ENCOUNTER — Encounter: Payer: Self-pay | Admitting: Cardiology

## 2021-04-27 ENCOUNTER — Ambulatory Visit (INDEPENDENT_AMBULATORY_CARE_PROVIDER_SITE_OTHER): Payer: Medicare Other | Admitting: Cardiology

## 2021-04-27 VITALS — BP 120/82 | HR 50 | Ht 71.0 in | Wt 200.0 lb

## 2021-04-27 DIAGNOSIS — I10 Essential (primary) hypertension: Secondary | ICD-10-CM

## 2021-04-27 DIAGNOSIS — Z8679 Personal history of other diseases of the circulatory system: Secondary | ICD-10-CM

## 2021-04-27 DIAGNOSIS — I442 Atrioventricular block, complete: Secondary | ICD-10-CM

## 2021-04-27 DIAGNOSIS — I2510 Atherosclerotic heart disease of native coronary artery without angina pectoris: Secondary | ICD-10-CM | POA: Diagnosis not present

## 2021-04-27 MED ORDER — METFORMIN HCL 500 MG PO TABS: 500.0000 mg | ORAL_TABLET | Freq: Every day | ORAL | Status: DC

## 2021-04-27 NOTE — Progress Notes (Signed)
Clinical Summary Troy Petty is a 70 y.o.male seen today for follow up of the following medical problems.   1. History of cardiomyopathy/ Low normal to mildly decreased LV systolic function   - echo 09/2013 LVEF 09-98%, grade I diastolic dysfunction. Apex appears to be hypokinetic though poor visualization   - prior cath in [facility] 10/2012 with only mid LAD 20% lesion, otherwise patent vessels.   - repeat echo 10/2014 with LVEF 60-65%, no WMAs. - Toprol decreased during EP visit due to mild SOB. Breathing improved lower dose Toprol.      - repeat echo pending after visit with Dr Rayann Heman - chronic SOB, overall stable.  - no recent edema.      2. Non-obstructive CAD   - mild LAD lesion from cath 2014          3. Complete heart block   - St Jude pacemaker followed by Dr Rayann Heman     -recent normal device check, no symptoms     4. HTN   - reports neprhologist  started losartan 50mg  daily   - compliant with meds. Feels like bp can spike in the evenings but not quite sure of exact pattern or severity.      5. AAA screen - 01/2017 no AAA     6. CKD - followed by [facility]           Past Medical History:  Diagnosis Date   Arthritis    MIDDLE FINGER AND RING FINGER BOTH HANDS   Chronic kidney disease, stage III (moderate) (HCC)    Chronic renal insufficiency    baseline creatinine is 1.8   Chronic systolic dysfunction of left ventricle 4/15   EF 40-45% in setting of PMT on limited echo   Complete heart block (HCC)    COPD (chronic obstructive pulmonary disease) ([place])    Coronary artery disease    NON OBSTRUCTIVE CAD; DR. Harl Bowie WITH [facility] IS PT'S CARDIOLOGIST   GERD (gastroesophageal reflux disease)    Hemorrhoids    Hypertension    Hypertrophy of prostate with urinary obstruction and other lower urinary tract symptoms (LUTS)    INDWELLING FOLEY CATHETER   Metabolic syndrome    Mild pulmonary hypertension ([place])    Pacemaker     IMPLANTED Mar 14, 2013 - DR. ALLRED WITH [facility] FOLLOWS DEVICE FUNCTIONING   Pain    BACK PAIN AT TIMES   Shortness of breath    WITH EXERTION   Vitamin B12 deficiency      No Known Allergies   Current Outpatient Medications  Medication Sig Dispense Refill   amLODipine (NORVASC) 10 MG tablet Take 1 tablet (10 mg total) by mouth daily. 90 tablet 3   aspirin 81 MG EC tablet Take 8 mg by mouth daily.     carvedilol (COREG) 3.125 MG tablet Take 1 tablet (3.125 mg total) by mouth 2 (two) times daily. 180 tablet 2   Ergocalciferol (VITAMIN D2 PO) Take by mouth.     losartan (COZAAR) 50 MG tablet Take 50 mg by mouth daily.     Magnesium 400 MG TABS Take 1 tablet by mouth daily.     metFORMIN (GLUCOPHAGE) 500 MG tablet Take by mouth 2 (two) times daily with a meal.     omeprazole (PRILOSEC) 40 MG capsule Take 1 capsule by mouth daily as needed.      No current facility-administered medications for this visit.     Past  Surgical History:  Procedure Laterality Date   CARDIAC CATHETERIZATION  2014   COLON SURGERY     COLON RESECTION FOR BLOCKAGE   COLONOSCOPY N/A 04/12/2016   Dr. Gala Romney: 10 mm polyp, two 4 mm polyps at transverse, tubulovillous adenoma, surveillance in 3 years.    COLONOSCOPY N/A 04/12/2016   Dr. Laural Golden: post-polypectomy bleed s/p clips at transverse colon    GREEN LIGHT LASER TURP (TRANSURETHRAL RESECTION OF PROSTATE N/A 02/13/2014   Procedure: GREEN LIGHT LASER TURP (TRANSURETHRAL RESECTION OF PROSTATE;  Surgeon: Festus Aloe, MD;  Location: [facility];  Service: Urology;  Laterality: N/A;   OTHER SURGICAL HISTORY     Intestinal blockage x2   OTHER SURGICAL HISTORY  1964   Football injury   PACEMAKER INSERTION  03/14/13   SJM Endurity implanted by Dr Truman Hayward in [place] for complete heart block     No Known Allergies    Family History  Problem Relation Age of Onset   Cancer Father    Colon cancer Father      Social History Mr. Hyndman reports  that he quit smoking about 9 years ago. His smoking use included cigarettes. He started smoking about 54 years ago. He has a 45.00 pack-year smoking history. He has never used smokeless tobacco. Mr. Reifsteck reports no history of alcohol use.   Review of Systems CONSTITUTIONAL: No weight loss, fever, chills, weakness or fatigue.  HEENT: Eyes: No visual loss, blurred vision, double vision or yellow sclerae.No hearing loss, sneezing, congestion, runny nose or sore throat.  SKIN: No rash or itching.  CARDIOVASCULAR: per hpi RESPIRATORY: No shortness of breath, cough or sputum.  GASTROINTESTINAL: No anorexia, nausea, vomiting or diarrhea. No abdominal pain or blood.  GENITOURINARY: No burning on urination, no polyuria NEUROLOGICAL: No headache, dizziness, syncope, paralysis, ataxia, numbness or tingling in the extremities. No change in bowel or bladder control.  MUSCULOSKELETAL: No muscle, back pain, joint pain or stiffness.  LYMPHATICS: No enlarged nodes. No history of splenectomy.  PSYCHIATRIC: No history of depression or anxiety.  ENDOCRINOLOGIC: No reports of sweating, cold or heat intolerance. No polyuria or polydipsia.  Marland Kitchen   Physical Examination Today's Vitals   04/27/21 1132  BP: 120/82  Pulse: (!) 50  SpO2: 92%  Weight: 200 lb (90.7 kg)  Height: 5\' 11"  (1.803 m)   Body mass index is 27.89 kg/m.  Gen: resting comfortably, no acute distress HEENT: no scleral icterus, pupils equal round and reactive, no palptable cervical adenopathy,  CV: RRR, no m/rg no jvd Resp: Clear to auscultation bilaterally GI: abdomen is soft, non-tender, non-distended, normal bowel sounds, no hepatosplenomegaly MSK: extremities are warm, no edema.  Skin: warm, no rash Neuro:  no focal deficits Psych: appropriate affect   Diagnostic Studies  09/2013 Echo   Study Conclusions  - Left ventricle: The cavity size was normal. Wall thickness was normal. Systolic function was mildly reduced. The  estimated ejection fraction was in the range of 45% to 50%. Doppler parameters are consistent with abnormal left ventricular relaxation (grade 1 diastolic dysfunction). - Regional wall motion abnormality: Several of the views are foreshortened, however overall the apex does appear hypokinetic. Hypokinesis of the apical anterior, apical inferior, apical septal, apical lateral, and apical myocardium. Can consider echo with contrast for more definitive evaluation of wall motion. - Aortic valve: Mildly calcified annulus. Trileaflet; mildly thickened leaflets. Valve area (VTI): 3.76 cm^2. Valve area (Vmax): 3.65 cm^2. - Mitral valve: Mildly calcified annulus. Mildly thickened leaflets . - Atrial septum: The  interatrial septum is mildly aneurysmal. - Systemic veins: The IVC is small, suggesting low RA pressures and hypovolemia.    10/2012 Cath [facility]   RHC: RA 5, PA 40/12 (mean not reported) PCWP 12, PA sat 75%.   LHC: LM patent, LAD mid 20%, LCX patent, RCA anomolous origine from left cusp patent. LVEF 70%.     10/2014 echo Study Conclusions   - Left ventricle: The cavity size was normal. Wall thickness was   increased in a pattern of mild LVH. Systolic function was normal.   The estimated ejection fraction was in the range of 60% to 65%.   Wall motion was normal; there were no regional wall motion   abnormalities. Doppler parameters are consistent with abnormal   left ventricular relaxation (grade 1 diastolic dysfunction). - Aortic valve: Mildly calcified annulus. Trileaflet; mildly   thickened leaflets. Valve area (VTI): 4.75 cm^2. Valve area   (Vmax): 3.73 cm^2. Valve area (Vmean): 3.54 cm^2. - Mitral valve: Mildly calcified annulus. Mildly thickened leaflets   . - Left atrium: The atrium was mildly dilated. - Atrial septum: No defect or patent foramen ovale was identified. - Pulmonary arteries: Systolic pressure was moderately increased.   PA peak pressure: 44 mm Hg  (S). - Technically adequate study.      Assessment and Plan   1. History of cardiomyopathy - some recent SOB/DOE, repeat echo is pending.  - appears euvolemic today       2. HTN   - at goal today, reports some bp spikes in the evenings but not quite sure of exact pattern or severity. They will update [place] Firday on AM and PM bp's and also how he is taking his bp meds   3. Complete heart block -no symptoms, recent normal device check - continue to monitor.      Arnoldo Lenis, M.D.

## 2021-04-27 NOTE — Patient Instructions (Addendum)
Medication Instructions:  Please call the office with how you are taking your blood pressure medications.  Continue all other current medications.  Labwork: none  Testing/Procedures: none  Follow-Up: 6 months   Any Other Special Instructions Will Be Listed Below (If Applicable). Please call Friday with update on blood pressure readings.    If you need a refill on your cardiac medications before your next appointment, please call your pharmacy.

## 2021-04-28 ENCOUNTER — Encounter: Payer: Self-pay | Admitting: *Deleted

## 2021-05-12 ENCOUNTER — Ambulatory Visit (INDEPENDENT_AMBULATORY_CARE_PROVIDER_SITE_OTHER): Payer: Medicare Other

## 2021-05-12 DIAGNOSIS — R0602 Shortness of breath: Secondary | ICD-10-CM | POA: Diagnosis not present

## 2021-05-13 LAB — ECHOCARDIOGRAM COMPLETE
AR max vel: 2.35 cm2
AV Area VTI: 2.12 cm2
AV Area mean vel: 2.6 cm2
AV Mean grad: 3.4 mmHg
AV Peak grad: 7.8 mmHg
Ao pk vel: 1.4 m/s
Area-P 1/2: 2.93 cm2
Calc EF: 47.5 %
S' Lateral: 3.87 cm
Single Plane A2C EF: 55.7 %
Single Plane A4C EF: 39.3 %

## 2021-05-19 ENCOUNTER — Encounter: Payer: Medicare Other | Admitting: Cardiovascular Disease

## 2021-06-14 ENCOUNTER — Telehealth: Payer: Self-pay | Admitting: *Deleted

## 2021-06-14 NOTE — Telephone Encounter (Signed)
Laurine Blazer, LPN  4/97/0263  7:85 PM EST Back to Top    Notified, copy to pcp.    Laurine Blazer, LPN  8/85/0277 41:28 AM EST     Left message to return call.   Damian Leavell, RN  05/17/2021  2:48 PM EST     Provided results of echocardiogram. Await further recommendations from Dr. Harl Bowie.   Damian Leavell, RN  05/17/2021  2:18 PM EST     Left message requesting call back.   Thompson Grayer, MD  05/16/2021  8:58 PM EST     Results reviewed.  Sonia Baller, please inform the patient of result. I will route to Dr Harl Bowie as an Juluis Rainier also.   EF previously 45% by echo in 2015 but appeared better in 2016.  May be largely unchanged.   Roderic Palau, should we consider entresto or make any other changes?

## 2021-06-14 NOTE — Telephone Encounter (Signed)
OV scheduled for 08/25/2021 with Dr. Harl Bowie - put on wait list as well.

## 2021-06-15 ENCOUNTER — Ambulatory Visit (INDEPENDENT_AMBULATORY_CARE_PROVIDER_SITE_OTHER): Payer: Medicare Other

## 2021-06-15 DIAGNOSIS — Z8679 Personal history of other diseases of the circulatory system: Secondary | ICD-10-CM | POA: Diagnosis not present

## 2021-06-16 LAB — CUP PACEART REMOTE DEVICE CHECK
Battery Remaining Longevity: 17 mo
Battery Remaining Percentage: 15 %
Battery Voltage: 2.89 V
Brady Statistic AP VP Percent: 2.8 %
Brady Statistic AP VS Percent: 1 %
Brady Statistic AS VP Percent: 97 %
Brady Statistic AS VS Percent: 1 %
Brady Statistic RA Percent Paced: 2.5 %
Brady Statistic RV Percent Paced: 99 %
Date Time Interrogation Session: 20230216094142
Implantable Lead Implant Date: 20141114
Implantable Lead Implant Date: 20141114
Implantable Lead Location: 753859
Implantable Lead Location: 753860
Implantable Pulse Generator Implant Date: 20141114
Lead Channel Impedance Value: 290 Ohm
Lead Channel Impedance Value: 450 Ohm
Lead Channel Pacing Threshold Amplitude: 0.5 V
Lead Channel Pacing Threshold Amplitude: 1 V
Lead Channel Pacing Threshold Pulse Width: 0.4 ms
Lead Channel Pacing Threshold Pulse Width: 0.4 ms
Lead Channel Sensing Intrinsic Amplitude: 12 mV
Lead Channel Sensing Intrinsic Amplitude: 4.9 mV
Lead Channel Setting Pacing Amplitude: 1.5 V
Lead Channel Setting Pacing Amplitude: 2.5 V
Lead Channel Setting Pacing Pulse Width: 0.4 ms
Lead Channel Setting Sensing Sensitivity: 4 mV
Pulse Gen Model: 2160
Pulse Gen Serial Number: 7520753

## 2021-06-20 NOTE — Progress Notes (Signed)
Remote pacemaker transmission.   

## 2021-06-24 ENCOUNTER — Other Ambulatory Visit (HOSPITAL_COMMUNITY): Payer: Self-pay | Admitting: Radiology

## 2021-06-24 DIAGNOSIS — J449 Chronic obstructive pulmonary disease, unspecified: Secondary | ICD-10-CM

## 2021-07-16 ENCOUNTER — Other Ambulatory Visit: Payer: Self-pay | Admitting: Cardiology

## 2021-07-17 ENCOUNTER — Other Ambulatory Visit: Payer: Self-pay | Admitting: Cardiology

## 2021-07-25 ENCOUNTER — Ambulatory Visit (INDEPENDENT_AMBULATORY_CARE_PROVIDER_SITE_OTHER): Payer: Medicare Other | Admitting: Cardiology

## 2021-07-25 ENCOUNTER — Encounter: Payer: Self-pay | Admitting: *Deleted

## 2021-07-25 ENCOUNTER — Encounter: Payer: Self-pay | Admitting: Cardiology

## 2021-07-25 VITALS — BP 118/60 | HR 70 | Ht 71.0 in | Wt 190.8 lb

## 2021-07-25 DIAGNOSIS — I10 Essential (primary) hypertension: Secondary | ICD-10-CM | POA: Diagnosis not present

## 2021-07-25 DIAGNOSIS — Z8679 Personal history of other diseases of the circulatory system: Secondary | ICD-10-CM | POA: Diagnosis not present

## 2021-07-25 MED ORDER — SPIRONOLACTONE 25 MG PO TABS: 12.5000 mg | ORAL_TABLET | Freq: Every day | ORAL | 6 refills | Status: DC

## 2021-07-25 NOTE — Progress Notes (Signed)
? ? ? ?Clinical Summary ?Troy Petty is a 71 y.o.male seen today for follow up of the following medical problems. This is a focused visit on his history of cardiomyopathy.  ?  ?1. History of cardiomyopathy/ Low normal to mildly decreased LV systolic function   ?- echo 09/2013 LVEF 42-87%, grade I diastolic dysfunction. Apex appears to be hypokinetic though poor visualization   ?- prior cath in [facility] 10/2012 with only mid LAD 20% lesion, otherwise patent vessels.   ?- repeat echo 10/2014 with LVEF 60-65%, no WMAs. ?- Toprol decreased during EP visit due to mild SOB. Breathing improved lower dose Toprol. ?  ? ?  ?Jan 2023 echo: LVEF 45%, grade I DD,  ?- he is on coreg, farxiga, losartan. Last Cr 1.7 ?- no recent edema, DOE with moderate levels of activity which is stable.  ?  ? ? ?Other medial issues not addressed this visit ? ?2. Non-obstructive CAD   ?- mild LAD lesion from cath 2014   ?  ?  ?  ?  ?3. Complete heart block   ?- [employer] pacemaker followed by Dr Rayann Heman   ?  ?-recent normal device check, no symptoms ?  ?  ?4. HTN   ?- reports neprhologist  started losartan '50mg'$  daily ?  ?- compliant with meds. Feels like bp can spike in the evenings but not quite sure of exact pattern or severity.  ?  ?  ?5. AAA screen ?- 01/2017 no AAA ?  ?  ?6. CKD ?- followed by [facility] ?- baseline Cr 1.7 ?Past Medical History:  ?Diagnosis Date  ? Arthritis   ? MIDDLE FINGER AND RING FINGER BOTH HANDS  ? Chronic kidney disease, stage III (moderate) (HCC)   ? Chronic renal insufficiency   ? baseline creatinine is 1.8  ? Chronic systolic dysfunction of left ventricle 4/15  ? EF 40-45% in setting of PMT on limited echo  ? Complete heart block ([place])   ? COPD (chronic obstructive pulmonary disease) ([place])   ? Coronary artery disease   ? NON OBSTRUCTIVE CAD; DR. Fairview IS PT'S CARDIOLOGIST  ? GERD (gastroesophageal reflux disease)   ? Hemorrhoids   ? Hypertension   ? Hypertrophy of prostate with urinary  obstruction and other lower urinary tract symptoms (LUTS)   ? INDWELLING FOLEY CATHETER  ? Metabolic syndrome   ? Mild pulmonary hypertension ([place])   ? Pacemaker   ? IMPLANTED Mar 14, 2013 - DR. ALLRED WITH [facility] FOLLOWS DEVICE FUNCTIONING  ? Pain   ? BACK PAIN AT TIMES  ? Shortness of breath   ? WITH EXERTION  ? Vitamin B12 deficiency   ? ? ? ?No Known Allergies ? ? ?Current Outpatient Medications  ?Medication Sig Dispense Refill  ? amLODipine (NORVASC) 10 MG tablet TAKE 1 TABLET BY MOUTH EVERY DAY 90 tablet 3  ? aspirin 81 MG EC tablet Take 8 mg by mouth daily.    ? carvedilol (COREG) 3.125 MG tablet TAKE 1 TABLET BY MOUTH 2 TIMES DAILY. *STOP METOPROLOL* 180 tablet 2  ? Ergocalciferol (VITAMIN D2 PO) Take by mouth.    ? losartan (COZAAR) 50 MG tablet Take 50 mg by mouth daily.    ? Magnesium 400 MG TABS Take 1 tablet by mouth daily.    ? metFORMIN (GLUCOPHAGE) 500 MG tablet Take 1 tablet (500 mg total) by mouth daily.    ? omeprazole (PRILOSEC) 40 MG capsule Take 1 capsule  by mouth daily as needed.     ? ?No current facility-administered medications for this visit.  ? ? ? ?Past Surgical History:  ?Procedure Laterality Date  ? CARDIAC CATHETERIZATION  2014  ? COLON SURGERY    ? COLON RESECTION FOR BLOCKAGE  ? COLONOSCOPY N/A 04/12/2016  ? Dr. Gala Romney: 10 mm polyp, two 4 mm polyps at transverse, tubulovillous adenoma, surveillance in 3 years.   ? COLONOSCOPY N/A 04/12/2016  ? Dr. Laural Golden: post-polypectomy bleed s/p clips at transverse colon   ? GREEN LIGHT LASER TURP (TRANSURETHRAL RESECTION OF PROSTATE N/A 02/13/2014  ? Procedure: GREEN LIGHT LASER TURP (TRANSURETHRAL RESECTION OF PROSTATE;  Surgeon: Festus Aloe, MD;  Location: [facility];  Service: Urology;  Laterality: N/A;  ? OTHER SURGICAL HISTORY    ? Intestinal blockage x2  ? OTHER SURGICAL HISTORY  1964  ? Football injury  ? PACEMAKER INSERTION  03/14/13  ? SJM Endurity implanted by Dr Truman Hayward in [place] for complete heart block  ? ? ? ?No Known  Allergies ? ? ? ?Family History  ?Problem Relation Age of Onset  ? Cancer Father   ? Colon cancer Father   ? ? ? ?Social History ?Troy Petty reports that he quit smoking about 10 years ago. His smoking use included cigarettes. He started smoking about 54 years ago. He has a 45.00 pack-year smoking history. He has never used smokeless tobacco. ?Troy Petty reports no history of alcohol use. ? ? ?Review of Systems ?CONSTITUTIONAL: No weight loss, fever, chills, weakness or fatigue.  ?HEENT: Eyes: No visual loss, blurred vision, double vision or yellow sclerae.No hearing loss, sneezing, congestion, runny nose or sore throat.  ?SKIN: No rash or itching.  ?CARDIOVASCULAR: per hpi ?RESPIRATORY: No shortness of breath, cough or sputum.  ?GASTROINTESTINAL: No anorexia, nausea, vomiting or diarrhea. No abdominal pain or blood.  ?GENITOURINARY: No burning on urination, no polyuria ?NEUROLOGICAL: No headache, dizziness, syncope, paralysis, ataxia, numbness or tingling in the extremities. No change in bowel or bladder control.  ?MUSCULOSKELETAL: No muscle, back pain, joint pain or stiffness.  ?LYMPHATICS: No enlarged nodes. No history of splenectomy.  ?PSYCHIATRIC: No history of depression or anxiety.  ?ENDOCRINOLOGIC: No reports of sweating, cold or heat intolerance. No polyuria or polydipsia.  ?. ? ? ?Physical Examination ?Today's Vitals  ? 07/25/21 1027 07/25/21 1034 07/25/21 1035  ?BP: (!) 106/50 126/80 118/60  ?Pulse: 70    ?SpO2: 95%    ?Weight: 190 lb 12.8 oz (86.5 kg)    ?Height: '5\' 11"'$  (1.803 m)    ? ?Body mass index is 26.61 kg/m?. ? ?Gen: resting comfortably, no acute distress ?HEENT: no scleral icterus, pupils equal round and reactive, no palptable cervical adenopathy,  ?CV: RRR, no m/r/g no jvd ?Resp: Clear to auscultation bilaterally ?GI: abdomen is soft, non-tender, non-distended, normal bowel sounds, no hepatosplenomegaly ?MSK: extremities are warm, no edema.  ?Skin: warm, no rash ?Neuro:  no focal  deficits ?Psych: appropriate affect ? ? ?Diagnostic Studies ?09/2013 Echo   ?Study Conclusions ? ?- Left ventricle: The cavity size was normal. Wall thickness was ?normal. Systolic function was mildly reduced. The estimated ?ejection fraction was in the range of 45% to 50%. Doppler ?parameters are consistent with abnormal left ventricular ?relaxation (grade 1 diastolic dysfunction). ?- Regional wall motion abnormality: Several of the views are ?foreshortened, however overall the apex does appear hypokinetic. ?Hypokinesis of the apical anterior, apical inferior, apical ?septal, apical lateral, and apical myocardium. Can consider echo ?with contrast for more definitive evaluation  of wall motion. ?- Aortic valve: Mildly calcified annulus. Trileaflet; mildly ?thickened leaflets. Valve area (VTI): 3.76 cm^2. Valve area ?(Vmax): 3.65 cm^2. ?- Mitral valve: Mildly calcified annulus. Mildly thickened leaflets ?. 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 ?- Systemic veins: The IVC is small, suggesting low RA pressures and ?hypovolemia.  ?  ?10/2012 [facility] [place]   ?RHC: RA 5, PA 40/12 (mean not reported) PCWP 12, PA sat 75%.   ?LHC: LM patent, LAD mid 20%, LCX patent, RCA anomolous origine from left cusp patent. LVEF 70%.   ?  ?10/2014 echo ?Study Conclusions ?  ?- Left ventricle: The cavity size was normal. Wall thickness was ?  increased in a pattern of mild LVH. Systolic function was normal. ?  The estimated ejection fraction was in the range of 60% to 65%. ?  Wall motion was normal; there were no regional wall motion ?  abnormalities. Doppler parameters are consistent with abnormal ?  left ventricular relaxation (grade 1 diastolic dysfunction). ?- Aortic valve: Mildly calcified annulus. Trileaflet; mildly ?  thickened leaflets. Valve area (VTI): 4.75 cm^2. Valve area ?  (Vmax): 3.73 cm^2. Valve area (Vmean): 3.54 cm^2. ?- Mitral valve: Mildly calcified annulus. Mildly thickened leaflets ?  . ?-  Left atrium: The atrium was mildly dilated. ?- Atrial septum: No defect or patent foramen ovale was identified. ?- Pulmonary arteries: Systolic pressure was moderately increased. ?  PA peak pressure: 44 mm Hg (S)

## 2021-07-25 NOTE — Patient Instructions (Addendum)
Medication Instructions:  ?Begin Spironolactone 12.'5mg'$  daily  ?Continue all other medications.    ? ?Labwork: ?BMET - order given today ?Please do in 2 weeks (around 08/08/2021) ?Office will contact with results via phone or letter.    ? ?Testing/Procedures: ?none ? ?Follow-Up: ?6 months  ? ?Any Other Special Instructions Will Be Listed Below (If Applicable). ? ? ?If you need a refill on your cardiac medications before your next appointment, please call your pharmacy. ? ?

## 2021-08-02 ENCOUNTER — Ambulatory Visit (HOSPITAL_COMMUNITY)
Admission: RE | Admit: 2021-08-02 | Discharge: 2021-08-02 | Disposition: A | Discharge status: Home / Self Care | Payer: Medicare Other | Source: Ambulatory Visit | Attending: Physician Assistant | Admitting: Physician Assistant

## 2021-08-02 DIAGNOSIS — J449 Chronic obstructive pulmonary disease, unspecified: Secondary | ICD-10-CM | POA: Insufficient documentation

## 2021-08-02 LAB — PULMONARY FUNCTION TEST
DL/VA % pred: 65 %
DL/VA: 2.62 ml/min/mmHg/L
DLCO unc % pred: 48 %
DLCO unc: 12.81 ml/min/mmHg
FEF 25-75 Post: 0.47 L/sec
FEF 25-75 Pre: 0.42 L/sec
FEF2575-%Change-Post: 11 %
FEF2575-%Pred-Post: 18 %
FEF2575-%Pred-Pre: 16 %
FEV1-%Change-Post: 2 %
FEV1-%Pred-Post: 51 %
FEV1-%Pred-Pre: 50 %
FEV1-Post: 1.53 L
FEV1-Pre: 1.49 L
FEV1FVC-%Change-Post: 3 %
FEV1FVC-%Pred-Pre: 58 %
FEV6-%Change-Post: 3 %
FEV6-%Pred-Post: 76 %
FEV6-%Pred-Pre: 73 %
FEV6-Post: 2.87 L
FEV6-Pre: 2.78 L
FEV6FVC-%Change-Post: 4 %
FEV6FVC-%Pred-Post: 90 %
FEV6FVC-%Pred-Pre: 86 %
FVC-%Change-Post: 0 %
FVC-%Pred-Post: 85 %
FVC-%Pred-Pre: 85 %
FVC-Post: 3.36 L
FVC-Pre: 3.38 L
Post FEV1/FVC ratio: 46 %
Post FEV6/FVC ratio: 86 %
Pre FEV1/FVC ratio: 44 %
Pre FEV6/FVC Ratio: 82 %
RV % pred: 138 %
RV: 3.53 L
TLC % pred: 93 %
TLC: 6.78 L

## 2021-08-02 MED ORDER — ALBUTEROL SULFATE (2.5 MG/3ML) 0.083% IN NEBU
2.5000 mg | INHALATION_SOLUTION | Freq: Once | RESPIRATORY_TRACT | Status: AC
  Administered 2021-08-02: 2.5 mg via RESPIRATORY_TRACT

## 2021-08-19 ENCOUNTER — Other Ambulatory Visit (HOSPITAL_COMMUNITY)
Admission: RE | Admit: 2021-08-19 | Discharge: 2021-08-19 | Disposition: A | Discharge status: Home / Self Care | Payer: Medicare Other | Source: Ambulatory Visit | Attending: Cardiology | Admitting: Cardiology

## 2021-08-19 DIAGNOSIS — I10 Essential (primary) hypertension: Secondary | ICD-10-CM | POA: Diagnosis present

## 2021-08-19 LAB — BASIC METABOLIC PANEL
Anion gap: 6 (ref 5–15)
BUN: 15 mg/dL (ref 8–23)
CO2: 25 mmol/L (ref 22–32)
Calcium: 8.7 mg/dL — ABNORMAL LOW (ref 8.9–10.3)
Chloride: 108 mmol/L (ref 98–111)
Creatinine, Ser: 1.74 mg/dL — ABNORMAL HIGH (ref 0.61–1.24)
GFR, Estimated: 41 mL/min — ABNORMAL LOW (ref >60)
Glucose, Bld: 93 mg/dL (ref 70–99)
Potassium: 3.7 mmol/L (ref 3.5–5.1)
Sodium: 139 mmol/L (ref 135–145)

## 2021-08-22 ENCOUNTER — Telehealth: Payer: Self-pay

## 2021-08-22 NOTE — Telephone Encounter (Signed)
Patient contacted and notified of results. Pt had no questions or concerns at this time.  ?

## 2021-08-22 NOTE — Telephone Encounter (Signed)
-----   Message from Arnoldo Lenis, MD sent at 08/22/2021  2:20 PM EDT ----- ?Labs look good, kidney function remains decreased but overall stable from prior labs ? ?Zandra Abts MD ?

## 2021-08-25 ENCOUNTER — Ambulatory Visit: Payer: Medicare Other | Admitting: Cardiology

## 2021-09-14 ENCOUNTER — Ambulatory Visit (INDEPENDENT_AMBULATORY_CARE_PROVIDER_SITE_OTHER): Payer: Medicare Other

## 2021-09-14 DIAGNOSIS — I442 Atrioventricular block, complete: Secondary | ICD-10-CM | POA: Diagnosis not present

## 2021-09-15 LAB — CUP PACEART REMOTE DEVICE CHECK
Battery Remaining Longevity: 14 mo
Battery Remaining Percentage: 13 %
Battery Voltage: 2.87 V
Brady Statistic AP VP Percent: 3.8 %
Brady Statistic AP VS Percent: 1 %
Brady Statistic AS VP Percent: 96 %
Brady Statistic AS VS Percent: 1 %
Brady Statistic RA Percent Paced: 3.4 %
Brady Statistic RV Percent Paced: 99 %
Date Time Interrogation Session: 20230518094829
Implantable Lead Implant Date: 20141114
Implantable Lead Implant Date: 20141114
Implantable Lead Location: 753859
Implantable Lead Location: 753860
Implantable Pulse Generator Implant Date: 20141114
Lead Channel Impedance Value: 290 Ohm
Lead Channel Impedance Value: 450 Ohm
Lead Channel Pacing Threshold Amplitude: 0.5 V
Lead Channel Pacing Threshold Amplitude: 1 V
Lead Channel Pacing Threshold Pulse Width: 0.4 ms
Lead Channel Pacing Threshold Pulse Width: 0.4 ms
Lead Channel Sensing Intrinsic Amplitude: 12 mV
Lead Channel Sensing Intrinsic Amplitude: 3.3 mV
Lead Channel Setting Pacing Amplitude: 1.5 V
Lead Channel Setting Pacing Amplitude: 2.5 V
Lead Channel Setting Pacing Pulse Width: 0.4 ms
Lead Channel Setting Sensing Sensitivity: 4 mV
Pulse Gen Model: 2160
Pulse Gen Serial Number: 7520753

## 2021-09-27 NOTE — Progress Notes (Signed)
Remote pacemaker transmission.   

## 2021-10-28 ENCOUNTER — Ambulatory Visit: Payer: Medicare Other | Admitting: Cardiology

## 2021-12-14 ENCOUNTER — Ambulatory Visit (INDEPENDENT_AMBULATORY_CARE_PROVIDER_SITE_OTHER): Payer: Medicare Other

## 2021-12-14 DIAGNOSIS — I442 Atrioventricular block, complete: Secondary | ICD-10-CM

## 2021-12-16 LAB — CUP PACEART REMOTE DEVICE CHECK
Battery Remaining Longevity: 14 mo
Battery Remaining Percentage: 12 %
Battery Voltage: 2.86 V
Brady Statistic AP VP Percent: 5.7 %
Brady Statistic AP VS Percent: 1 %
Brady Statistic AS VP Percent: 94 %
Brady Statistic AS VS Percent: 1 %
Brady Statistic RA Percent Paced: 5.1 %
Brady Statistic RV Percent Paced: 99 %
Date Time Interrogation Session: 20230817102044
Implantable Lead Implant Date: 20141114
Implantable Lead Implant Date: 20141114
Implantable Lead Location: 753859
Implantable Lead Location: 753860
Implantable Pulse Generator Implant Date: 20141114
Lead Channel Impedance Value: 290 Ohm
Lead Channel Impedance Value: 480 Ohm
Lead Channel Pacing Threshold Amplitude: 0.5 V
Lead Channel Pacing Threshold Amplitude: 1 V
Lead Channel Pacing Threshold Pulse Width: 0.4 ms
Lead Channel Pacing Threshold Pulse Width: 0.4 ms
Lead Channel Sensing Intrinsic Amplitude: 12 mV
Lead Channel Sensing Intrinsic Amplitude: 3.4 mV
Lead Channel Setting Pacing Amplitude: 1.5 V
Lead Channel Setting Pacing Amplitude: 2.5 V
Lead Channel Setting Pacing Pulse Width: 0.4 ms
Lead Channel Setting Sensing Sensitivity: 4 mV
Pulse Gen Model: 2160
Pulse Gen Serial Number: 7520753

## 2022-01-13 NOTE — Progress Notes (Signed)
Remote pacemaker transmission.   

## 2022-01-20 ENCOUNTER — Other Ambulatory Visit: Payer: Self-pay | Admitting: Cardiology

## 2022-02-08 ENCOUNTER — Encounter: Payer: Self-pay | Admitting: Cardiovascular Disease

## 2022-03-03 ENCOUNTER — Encounter: Payer: Medicare Other | Admitting: Internal Medicine

## 2022-03-15 ENCOUNTER — Ambulatory Visit (INDEPENDENT_AMBULATORY_CARE_PROVIDER_SITE_OTHER): Payer: Medicare Other

## 2022-03-15 DIAGNOSIS — Z950 Presence of cardiac pacemaker: Secondary | ICD-10-CM

## 2022-03-15 DIAGNOSIS — I442 Atrioventricular block, complete: Secondary | ICD-10-CM | POA: Diagnosis not present

## 2022-03-16 LAB — CUP PACEART REMOTE DEVICE CHECK
Battery Remaining Longevity: 13 mo
Battery Remaining Percentage: 11 %
Battery Voltage: 2.84 V
Brady Statistic AP VP Percent: 6.8 %
Brady Statistic AP VS Percent: 1 %
Brady Statistic AS VP Percent: 93 %
Brady Statistic AS VS Percent: 1 %
Brady Statistic RA Percent Paced: 6.1 %
Brady Statistic RV Percent Paced: 99 %
Date Time Interrogation Session: 20231115134204
Implantable Lead Connection Status: 753985
Implantable Lead Connection Status: 753985
Implantable Lead Implant Date: 20141114
Implantable Lead Implant Date: 20141114
Implantable Lead Location: 753859
Implantable Lead Location: 753860
Implantable Pulse Generator Implant Date: 20141114
Lead Channel Impedance Value: 330 Ohm
Lead Channel Impedance Value: 490 Ohm
Lead Channel Pacing Threshold Amplitude: 0.375 V
Lead Channel Pacing Threshold Amplitude: 1 V
Lead Channel Pacing Threshold Pulse Width: 0.4 ms
Lead Channel Pacing Threshold Pulse Width: 0.4 ms
Lead Channel Sensing Intrinsic Amplitude: 5 mV
Lead Channel Sensing Intrinsic Amplitude: 8.5 mV
Lead Channel Setting Pacing Amplitude: 1.375
Lead Channel Setting Pacing Amplitude: 2.5 V
Lead Channel Setting Pacing Pulse Width: 0.4 ms
Lead Channel Setting Sensing Sensitivity: 4 mV
Pulse Gen Model: 2160
Pulse Gen Serial Number: 7520753

## 2022-04-06 NOTE — Progress Notes (Signed)
Remote pacemaker transmission.   

## 2022-04-07 ENCOUNTER — Ambulatory Visit: Payer: Medicare Other | Attending: Cardiovascular Disease | Admitting: Cardiovascular Disease

## 2022-04-07 ENCOUNTER — Encounter: Payer: Self-pay | Admitting: Cardiovascular Disease

## 2022-04-07 VITALS — BP 143/82 | HR 70 | Ht 71.0 in | Wt 194.4 lb

## 2022-04-07 DIAGNOSIS — I5020 Unspecified systolic (congestive) heart failure: Secondary | ICD-10-CM

## 2022-04-07 DIAGNOSIS — I10 Essential (primary) hypertension: Secondary | ICD-10-CM

## 2022-04-07 DIAGNOSIS — I442 Atrioventricular block, complete: Secondary | ICD-10-CM | POA: Diagnosis present

## 2022-04-07 NOTE — Progress Notes (Signed)
PCP: Jacqualine Code, DO   Primary EP:  Dr Myles Gip  Troy Petty is a 71 y.o. male who presents today for routine electrophysiology followup.  Since last being seen in our clinic, the patient reports doing reasonably well.   He is not very active.  He has SOB with moderate activity.  This is chronic.   Today, he denies symptoms of palpitations, chest pain,  lower extremity edema, dizziness, presyncope, or syncope.  The patient is otherwise without complaint today.   Past Medical History:  Diagnosis Date   Arthritis    MIDDLE FINGER AND RING FINGER BOTH HANDS   Chronic kidney disease, stage III (moderate) (HCC)    Chronic renal insufficiency    baseline creatinine is 1.8   Chronic systolic dysfunction of left ventricle 4/15   EF 40-45% in setting of PMT on limited echo   Complete heart block (HCC)    COPD (chronic obstructive pulmonary disease) ([place])    Coronary artery disease    NON OBSTRUCTIVE CAD; DR. Harl Bowie WITH [facility] IS PT'S CARDIOLOGIST   GERD (gastroesophageal reflux disease)    Hemorrhoids    Hypertension    Hypertrophy of prostate with urinary obstruction and other lower urinary tract symptoms (LUTS)    INDWELLING FOLEY CATHETER   Metabolic syndrome    Mild pulmonary hypertension ([place])    Pacemaker    IMPLANTED Mar 14, 2013 - DR. ALLRED WITH [facility] FOLLOWS DEVICE FUNCTIONING   Pain    BACK PAIN AT TIMES   Shortness of breath    WITH EXERTION   Vitamin B12 deficiency    Past Surgical History:  Procedure Laterality Date   CARDIAC CATHETERIZATION  2014   COLON SURGERY     COLON RESECTION FOR BLOCKAGE   COLONOSCOPY N/A 04/12/2016   Dr. Gala Romney: 10 mm polyp, two 4 mm polyps at transverse, tubulovillous adenoma, surveillance in 3 years.    COLONOSCOPY N/A 04/12/2016   Dr. Laural Golden: post-polypectomy bleed s/p clips at transverse colon    GREEN LIGHT LASER TURP (TRANSURETHRAL RESECTION OF PROSTATE N/A 02/13/2014   Procedure: GREEN LIGHT  LASER TURP (TRANSURETHRAL RESECTION OF PROSTATE;  Surgeon: Festus Aloe, MD;  Location: [facility];  Service: Urology;  Laterality: N/A;   OTHER SURGICAL HISTORY     Intestinal blockage x2   OTHER SURGICAL HISTORY  1964   Football injury   PACEMAKER INSERTION  03/14/13   SJM Endurity implanted by Dr Truman Hayward in [place] for complete heart block    ROS- all systems are reviewed and negative except as per HPI above  Current Outpatient Medications  Medication Sig Dispense Refill   albuterol (VENTOLIN HFA) 108 (90 Base) MCG/ACT inhaler Inhale 2 puffs every 4 hours by inhalation route.     aspirin 81 MG EC tablet Take 8 mg by mouth daily.     carvedilol (COREG) 3.125 MG tablet TAKE 1 TABLET BY MOUTH 2 TIMES DAILY. *STOP METOPROLOL* 180 tablet 2   Ergocalciferol (VITAMIN D2 PO) Take by mouth.     JARDIANCE 25 MG TABS tablet Take 25 mg by mouth daily.     losartan (COZAAR) 50 MG tablet Take 50 mg by mouth daily.     Magnesium 400 MG TABS Take 1 tablet by mouth daily.     omeprazole (PRILOSEC) 40 MG capsule Take 1 capsule by mouth daily as needed.      spironolactone (ALDACTONE) 25 MG tablet TAKE 1/2 TABLET BY MOUTH EVERY DAY 45 tablet  2   SYMBICORT 160-4.5 MCG/ACT inhaler Inhale 2 puffs into the lungs 2 (two) times daily.     amLODipine (NORVASC) 10 MG tablet TAKE 1 TABLET BY MOUTH EVERY DAY (Patient not taking: Reported on 04/07/2022) 90 tablet 3   No current facility-administered medications for this visit.    Physical Exam: Vitals:   04/07/22 1139  BP: (!) 143/82  Pulse: 70  SpO2: 98%  Weight: 194 lb 6.4 oz (88.2 kg)  Height: '5\' 11"'$  (1.803 m)    GEN- The patient is well appearing, alert and oriented x 3 today.   Lungs- normal work of breathing Chest- pacemaker pocket is well healed Heart- Regular rate and rhythm, no murmurs, rubs or gallops Extremities- no clubbing, cyanosis, or edema  Pacemaker interrogation- reviewed in detail today,  See PACEART report  ekg tracing ordered  today is personally reviewed and shows sinus with V pacing and occasional atrial pacing   Assessment and Plan:  1. Symptomatic complete heart block Normal pacemaker function See Pace Art report No changes today Chronic RA noise is noted he is device dependant today  2. CAD (nonobstructive) No ischemic symptoms No changes  3. HTN Stable No change required today  4. CHF moderately reduced EF: EF was 45% in January, 2023. He has shortness of breath and fatigue. Will repeat TTE and possibly schedule for upgrade to CRT if EF remains subnormal. We briefly discussed the upgrade procedure.    5. Atrial high rate episodes: lasting less than 1 minute. Continue to monitor.  6. Chronic diastolic dysfunction EF normal by echo 2016 Clinically stable Follows with Dr Harl Bowie   Return in a year  Melida Quitter, MD 04/07/2022 12:04 PM

## 2022-04-07 NOTE — Patient Instructions (Signed)
Medication Instructions:  Continue all current medications.  Labwork: none  Testing/Procedures: Your physician has requested that you have an echocardiogram. Echocardiography is a painless test that uses sound waves to create images of your heart. It provides your doctor with information about the size and shape of your heart and how well your heart's chambers and valves are working. This procedure takes approximately one hour. There are no restrictions for this procedure. Please do NOT wear cologne, perfume, aftershave, or lotions (deodorant is allowed). Please arrive 15 minutes prior to your appointment time.  Office will contact with results via phone, letter or mychart.     Follow-Up: Next available after Echo   Any Other Special Instructions Will Be Listed Below (If Applicable).   If you need a refill on your cardiac medications before your next appointment, please call your pharmacy.

## 2022-04-20 ENCOUNTER — Other Ambulatory Visit: Payer: Self-pay | Admitting: Cardiology

## 2022-04-20 ENCOUNTER — Ambulatory Visit: Payer: Medicare Other | Attending: Cardiovascular Disease

## 2022-04-20 DIAGNOSIS — I5020 Unspecified systolic (congestive) heart failure: Secondary | ICD-10-CM | POA: Diagnosis not present

## 2022-04-21 LAB — ECHOCARDIOGRAM COMPLETE
AR max vel: 2.61 cm2
AV Area VTI: 2.92 cm2
AV Area mean vel: 2.73 cm2
AV Mean grad: 3 mmHg
AV Peak grad: 5.9 mmHg
Ao pk vel: 1.22 m/s
Area-P 1/2: 2.55 cm2
Calc EF: 50.4 %
MV M vel: 3.35 m/s
MV Peak grad: 44.9 mmHg
S' Lateral: 3.5 cm
Single Plane A2C EF: 52 %
Single Plane A4C EF: 47.1 %

## 2022-05-19 ENCOUNTER — Encounter: Payer: Self-pay | Admitting: Cardiovascular Disease

## 2022-05-19 ENCOUNTER — Other Ambulatory Visit: Payer: Self-pay

## 2022-05-19 ENCOUNTER — Ambulatory Visit: Payer: Medicare Other | Attending: Cardiovascular Disease | Admitting: Cardiovascular Disease

## 2022-05-19 VITALS — BP 124/68 | HR 73 | Ht 71.0 in | Wt 191.4 lb

## 2022-05-19 DIAGNOSIS — I5020 Unspecified systolic (congestive) heart failure: Secondary | ICD-10-CM

## 2022-05-19 DIAGNOSIS — I442 Atrioventricular block, complete: Secondary | ICD-10-CM | POA: Insufficient documentation

## 2022-05-19 NOTE — Patient Instructions (Signed)
Medication Instructions:  Continue all current medications.  Labwork: none  Testing/Procedures: Bi-V upgrade - nurse from [employer] office will schedule   Follow-Up: Pending   Any Other Special Instructions Will Be Listed Below (If Applicable).   If you need a refill on your cardiac medications before your next appointment, please call your pharmacy.

## 2022-05-19 NOTE — Progress Notes (Signed)
PCP: Jacqualine Code, DO   Primary EP:  Dr Myles Gip  Troy Petty is a 72 y.o. male who presents today for routine electrophysiology followup.  Since last being seen in our clinic, the patient reports doing reasonably well.   He is not very active.  He has SOB with moderate activity.  This is chronic.   05/19/2022 He presents for follow-up after TTE, performed 03/2022 that showed persistently reduced EF of 45%.  Today, he denies symptoms of palpitations, chest pain,  lower extremity edema, dizziness, presyncope, or syncope.  The patient is otherwise without complaint today.   He continues to note fatigue though. He notices that he has less exertional capacity even when wearing just a heavy coat or thick coveralls.  Past Medical History:  Diagnosis Date   Arthritis    MIDDLE FINGER AND RING FINGER BOTH HANDS   Chronic kidney disease, stage III (moderate) (HCC)    Chronic renal insufficiency    baseline creatinine is 1.8   Chronic systolic dysfunction of left ventricle 4/15   EF 40-45% in setting of PMT on limited echo   Complete heart block (HCC)    COPD (chronic obstructive pulmonary disease) ([employer])    Coronary artery disease    NON OBSTRUCTIVE CAD; DR. Harl Bowie WITH [facility] IS PT'S CARDIOLOGIST   GERD (gastroesophageal reflux disease)    Hemorrhoids    Hypertension    Hypertrophy of prostate with urinary obstruction and other lower urinary tract symptoms (LUTS)    INDWELLING FOLEY CATHETER   Metabolic syndrome    Mild pulmonary hypertension ([place])    Pacemaker    IMPLANTED Mar 14, 2013 - DR. ALLRED WITH [facility] FOLLOWS DEVICE FUNCTIONING   Pain    BACK PAIN AT TIMES   Shortness of breath    WITH EXERTION   Vitamin B12 deficiency    Past Surgical History:  Procedure Laterality Date   CARDIAC CATHETERIZATION  2014   COLON SURGERY     COLON RESECTION FOR BLOCKAGE   COLONOSCOPY N/A 04/12/2016   Dr. Gala Romney: 10 mm polyp, two 4 mm polyps at  transverse, tubulovillous adenoma, surveillance in 3 years.    COLONOSCOPY N/A 04/12/2016   Dr. Laural Golden: post-polypectomy bleed s/p clips at transverse colon    GREEN LIGHT LASER TURP (TRANSURETHRAL RESECTION OF PROSTATE N/A 02/13/2014   Procedure: GREEN LIGHT LASER TURP (TRANSURETHRAL RESECTION OF PROSTATE;  Surgeon: Festus Aloe, MD;  Location: [facility];  Service: Urology;  Laterality: N/A;   OTHER SURGICAL HISTORY     Intestinal blockage x2   OTHER SURGICAL HISTORY  1964   Football injury   PACEMAKER INSERTION  03/14/13   SJM Endurity implanted by Dr Truman Hayward in [place] for complete heart block    ROS- all systems are reviewed and negative except as per HPI above  Current Outpatient Medications  Medication Sig Dispense Refill   albuterol (VENTOLIN HFA) 108 (90 Base) MCG/ACT inhaler Inhale 2 puffs every 4 hours by inhalation route.     amLODipine (NORVASC) 10 MG tablet TAKE 1 TABLET BY MOUTH EVERY DAY (Patient not taking: Reported on 04/07/2022) 90 tablet 3   aspirin 81 MG EC tablet Take 8 mg by mouth daily.     carvedilol (COREG) 3.125 MG tablet TAKE 1 TABLET BY MOUTH 2 TIMES DAILY. *STOP METOPROLOL* 180 tablet 2   Ergocalciferol (VITAMIN D2 PO) Take by mouth.     JARDIANCE 25 MG TABS tablet Take 25 mg by mouth daily.  losartan (COZAAR) 50 MG tablet Take 50 mg by mouth daily.     Magnesium 400 MG TABS Take 1 tablet by mouth daily.     omeprazole (PRILOSEC) 40 MG capsule Take 1 capsule by mouth daily as needed.      spironolactone (ALDACTONE) 25 MG tablet TAKE 1/2 TABLET BY MOUTH EVERY DAY 45 tablet 2   SYMBICORT 160-4.5 MCG/ACT inhaler Inhale 2 puffs into the lungs 2 (two) times daily.     No current facility-administered medications for this visit.    Physical Exam: There were no vitals filed for this visit.   GEN- The patient is well appearing, alert and oriented x 3 today.   Lungs- normal work of breathing Chest- pacemaker pocket is well healed Heart- Regular rate and  rhythm, no murmurs, rubs or gallops Extremities- no clubbing, cyanosis, or edema  Pacemaker interrogation- reviewed in detail today,  See PACEART report  ekg tracing ordered today is personally reviewed and shows sinus with V pacing and occasional atrial pacing   Assessment and Plan:  1. Symptomatic complete heart block Normal pacemaker function - QRS is 190 msec See Pace Art report No changes today Chronic RA noise is noted he is device dependant today  2. CAD (nonobstructive) No ischemic symptoms No changes  3. HTN Stable No change required today  4. CHF moderately reduced EF: EF remains reduced at 45%. Symptomatic with NYHA II symptoms. I think he will benefit from improved V-V synchrony. I discussed the indication for the LV upgrade procedure and the logistics, risks, potential benefit, and after care. I specifically explained that risks include but are not limited to infection, bleeding,damage to blood vessels, lung, and the heart -- but risk of prolonged hospitalization, need for surgery, or the event of stroke, heart attack, or death are low but not zero.     5. Atrial high rate episodes: lasting less than 1 minute. Continue to monitor.  6. Chronic diastolic dysfunction EF normal by echo 2016 Clinically stable Follows with Dr Salli Real, MD 05/19/2022 8:17 AM

## 2022-06-14 ENCOUNTER — Ambulatory Visit: Payer: Medicare Other | Admitting: Cardiology

## 2022-06-14 ENCOUNTER — Ambulatory Visit: Payer: Medicare Other

## 2022-06-14 DIAGNOSIS — I442 Atrioventricular block, complete: Secondary | ICD-10-CM

## 2022-06-14 LAB — CUP PACEART REMOTE DEVICE CHECK
Battery Remaining Longevity: 12 mo
Battery Remaining Percentage: 10 %
Battery Voltage: 2.83 V
Brady Statistic AP VP Percent: 8.8 %
Brady Statistic AP VS Percent: 1 %
Brady Statistic AS VP Percent: 89 %
Brady Statistic AS VS Percent: 1 %
Brady Statistic RA Percent Paced: 7.1 %
Brady Statistic RV Percent Paced: 98 %
Date Time Interrogation Session: 20240214123114
Implantable Lead Connection Status: 753985
Implantable Lead Connection Status: 753985
Implantable Lead Implant Date: 20141114
Implantable Lead Implant Date: 20141114
Implantable Lead Location: 753859
Implantable Lead Location: 753860
Implantable Pulse Generator Implant Date: 20141114
Lead Channel Impedance Value: 340 Ohm
Lead Channel Impedance Value: 560 Ohm
Lead Channel Pacing Threshold Amplitude: 0.5 V
Lead Channel Pacing Threshold Amplitude: 1 V
Lead Channel Pacing Threshold Pulse Width: 0.4 ms
Lead Channel Pacing Threshold Pulse Width: 0.4 ms
Lead Channel Sensing Intrinsic Amplitude: 12 mV
Lead Channel Sensing Intrinsic Amplitude: 5 mV
Lead Channel Setting Pacing Amplitude: 1.5 V
Lead Channel Setting Pacing Amplitude: 2.5 V
Lead Channel Setting Pacing Pulse Width: 0.4 ms
Lead Channel Setting Sensing Sensitivity: 4 mV
Pulse Gen Model: 2160
Pulse Gen Serial Number: 7520753

## 2022-06-14 NOTE — Progress Notes (Deleted)
Clinical Summary Troy Petty is a 72 y.o.male  1. History of cardiomyopathy/ Low normal to mildly decreased LV systolic function   - echo 09/2013 LVEF Q000111Q, grade I diastolic dysfunction. Apex appears to be hypokinetic though poor visualization   - prior cath in [facility] 10/2012 with only mid LAD 20% lesion, otherwise patent vessels.   - repeat echo 10/2014 with LVEF 60-65%, no WMAs. - Toprol decreased during EP visit due to mild SOB. Breathing improved lower dose Toprol.       Jan 2023 echo: LVEF 45%, grade I DD,  - he is on coreg, farxiga, losartan. Last Cr 1.7 - no recent edema, DOE with moderate levels of activity which is stable.        Other medial issues not addressed this visit   2. Non-obstructive CAD   - mild LAD lesion from cath 2014           3. Complete heart block   - St Jude pacemaker followed by Dr Myles Gip - plans for BiV upgrade per EP       4. HTN   - reports neprhologist  started losartan 71m daily   - compliant with meds. Feels like bp can spike in the evenings but not quite sure of exact pattern or severity.      5. AAA screen - 01/2017 no AAA     6. CKD - [facility] - baseline Cr 1.7 Past Medical History:  Diagnosis Date   Arthritis    MIDDLE FINGER AND RING FINGER BOTH HANDS   Chronic kidney disease, stage III (moderate) (HCC)    Chronic renal insufficiency    baseline creatinine is 1.8   Chronic systolic dysfunction of left ventricle 4/15   EF 40-45% in setting of PMT on limited echo   Complete heart block (HCC)    COPD (chronic obstructive pulmonary disease) ([place]    Coronary artery disease    NON OBSTRUCTIVE CAD; [facility] IS [facility] CARDIOLOGIST   GERD (gastroesophageal reflux disease)    Hemorrhoids    Hypertension    Hypertrophy of prostate with urinary obstruction and other lower urinary tract symptoms (LUTS)    INDWELLING FOLEY CATHETER   Metabolic syndrome    Mild  pulmonary hypertension ([facility]    Pacemaker    IMPLANTED Mar 14, 2013 - DR. ALLRED WITH [facility] FOLLOWS DEVICE FUNCTIONING   Pain    BACK PAIN AT TIMES   Shortness of breath    WITH EXERTION   Vitamin B12 deficiency      No Known Allergies   Current Outpatient Medications  Medication Sig Dispense Refill   albuterol (VENTOLIN HFA) 108 (90 Base) MCG/ACT inhaler Inhale 2 puffs every 4 hours by inhalation route. (Patient not taking: Reported on 05/19/2022)     amLODipine (NORVASC) 10 MG tablet TAKE 1 TABLET BY MOUTH EVERY DAY 90 tablet 3   aspirin 81 MG EC tablet Take 8 mg by mouth daily.     carvedilol (COREG) 3.125 MG tablet TAKE 1 TABLET BY MOUTH 2 TIMES DAILY. *STOP METOPROLOL* 180 tablet 2   Ergocalciferol (VITAMIN D2 PO) Take by mouth.     JARDIANCE 25 MG TABS tablet Take 25 mg by mouth daily.     losartan (COZAAR) 50 MG tablet Take 50 mg by mouth daily.     Magnesium 400 MG TABS Take 1 tablet by mouth daily. (Patient not taking: Reported on 05/19/2022)  omeprazole (PRILOSEC) 40 MG capsule Take 1 capsule by mouth daily as needed.      spironolactone (ALDACTONE) 25 MG tablet TAKE 1/2 TABLET BY MOUTH EVERY DAY 45 tablet 2   SYMBICORT 160-4.5 MCG/ACT inhaler Inhale 2 puffs into the lungs 2 (two) times daily. (Patient not taking: Reported on 05/19/2022)     No current facility-administered medications for this visit.     Past Surgical History:  Procedure Laterality Date   CARDIAC CATHETERIZATION  2014   COLON SURGERY     COLON RESECTION FOR BLOCKAGE   COLONOSCOPY N/A 04/12/2016   Dr. Gala Romney: 10 mm polyp, two 4 mm polyps at transverse, tubulovillous adenoma, surveillance in 3 years.    COLONOSCOPY N/A 04/12/2016   Dr. Laural Golden: post-polypectomy bleed s/p clips at transverse colon    GREEN LIGHT LASER TURP (TRANSURETHRAL RESECTION OF PROSTATE N/A 02/13/2014   Procedure: GREEN LIGHT LASER TURP (TRANSURETHRAL RESECTION OF PROSTATE;  Surgeon: Festus Aloe, MD;  Location: [facility]  [facility];  Service: Urology;  Laterality: N/A;   OTHER SURGICAL HISTORY     Intestinal blockage x2   OTHER SURGICAL HISTORY  1964   Football injury   PACEMAKER INSERTION  03/14/13   SJM Endurity implanted by Dr Truman Hayward in [place] for complete heart block     No Known Allergies    Family History  Problem Relation Age of Onset   Cancer Father    Colon cancer Father      Social History Troy Petty reports that he quit smoking about 11 years ago. His smoking use included cigarettes. He started smoking about 55 years ago. He has a 45.00 pack-year smoking history. He has never used smokeless tobacco. Troy Petty reports no history of alcohol use.   Review of Systems CONSTITUTIONAL: No weight loss, fever, chills, weakness or fatigue.  HEENT: Eyes: No visual loss, blurred vision, double vision or yellow sclerae.No hearing loss, sneezing, congestion, runny nose or sore throat.  SKIN: No rash or itching.  CARDIOVASCULAR:  RESPIRATORY: No shortness of breath, cough or sputum.  GASTROINTESTINAL: No anorexia, nausea, vomiting or diarrhea. No abdominal pain or blood.  GENITOURINARY: No burning on urination, no polyuria NEUROLOGICAL: No headache, dizziness, syncope, paralysis, ataxia, numbness or tingling in the extremities. No change in bowel or bladder control.  MUSCULOSKELETAL: No muscle, back pain, joint pain or stiffness.  LYMPHATICS: No enlarged nodes. No history of splenectomy.  PSYCHIATRIC: No history of depression or anxiety.  ENDOCRINOLOGIC: No reports of sweating, cold or heat intolerance. No polyuria or polydipsia.  Marland Kitchen   Physical Examination There were no vitals filed for this visit. There were no vitals filed for this visit.  Gen: resting comfortably, no acute distress HEENT: no scleral icterus, pupils equal round and reactive, no palptable cervical adenopathy,  CV Resp: Clear to auscultation bilaterally GI: abdomen is soft, non-tender, non-distended, normal bowel sounds,  no hepatosplenomegaly MSK: extremities are warm, no edema.  Skin: warm, no rash Neuro:  no focal deficits Psych: appropriate affect   Diagnostic Studies 09/2013 Echo   Study Conclusions  - Left ventricle: The cavity size was normal. Wall thickness was normal. Systolic function was mildly reduced. The estimated ejection fraction was in the range of 45% to 50%. Doppler parameters are consistent with abnormal left ventricular relaxation (grade 1 diastolic dysfunction). - Regional wall motion abnormality: Several of the views are foreshortened, however overall the apex does appear hypokinetic. Hypokinesis of the apical anterior, apical inferior, apical septal, apical lateral, and apical myocardium. Can consider  echo with contrast for more definitive evaluation of wall motion. - Aortic valve: Mildly calcified annulus. Trileaflet; mildly thickened leaflets. Valve area (VTI): 3.76 cm^2. Valve area (Vmax): 3.65 cm^2. - Mitral valve: Mildly calcified annulus. Mildly thickened leaflets . 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 - Systemic veins: The IVC is small, suggesting low RA pressures and hypovolemia.    10/2012 Cath [facility]   RHC: RA 5, PA 40/12 (mean not reported) PCWP 12, PA sat 75%.   LHC: LM patent, LAD mid 20%, LCX patent, RCA anomolous origine from left cusp patent. LVEF 70%.     10/2014 echo Study Conclusions   - Left ventricle: The cavity size was normal. Wall thickness was   increased in a pattern of mild LVH. Systolic function was normal.   The estimated ejection fraction was in the range of 60% to 65%.   Wall motion was normal; there were no regional wall motion   abnormalities. Doppler parameters are consistent with abnormal   left ventricular relaxation (grade 1 diastolic dysfunction). - Aortic valve: Mildly calcified annulus. Trileaflet; mildly   thickened leaflets. Valve area (VTI): 4.75 cm^2. Valve area   (Vmax): 3.73 cm^2. Valve  area (Vmean): 3.54 cm^2. - Mitral valve: Mildly calcified annulus. Mildly thickened leaflets   . - Left atrium: The atrium was mildly dilated. - Atrial septum: No defect or patent foramen ovale was identified. - Pulmonary arteries: Systolic pressure was moderately increased.   PA peak pressure: 44 mm Hg (S). - Technically adequate study.   Jan 2023 echo IMPRESSIONS     1. Left ventricular ejection fraction, by estimation, is 45%. The left  ventricle has mildly decreased function. Left ventricular endocardial  border not optimally defined to evaluate regional wall motion. Left  ventricular diastolic parameters are  consistent with Grade I diastolic dysfunction (impaired relaxation).   2. Right ventricular systolic function is normal. The right ventricular  size is normal. There is normal pulmonary artery systolic pressure.   3. The mitral valve is normal in structure. No evidence of mitral valve  regurgitation. No evidence of mitral stenosis.   4. The tricuspid valve is abnormal.   5. The aortic valve has an indeterminant number of cusps. There is mild  calcification of the aortic valve. There is mild thickening of the aortic  valve. Aortic valve regurgitation is not visualized. No aortic stenosis is  present.   6. The inferior vena cava is normal in size with greater than 50%  respiratory variability, suggesting right atrial pressure of 3 mmHg.    03/2022 echo IMPRESSIONS     1. Left ventricular ejection fraction, by estimation, is 45%. The left  ventricle has mildly decreased function. The left ventricle demonstrates  regional wall motion abnormalities (see scoring diagram/findings for  description). Left ventricular diastolic   parameters are consistent with Grade I diastolic dysfunction (impaired  relaxation).   2. Right ventricular systolic function is normal. The right ventricular  size is normal.   3. The mitral valve is normal in structure. Trivial mitral valve   regurgitation. No evidence of mitral stenosis.   4. The aortic valve is tricuspid. Aortic valve regurgitation is not  visualized. No aortic stenosis is present.   5. The inferior vena cava is normal in size with greater than 50%  respiratory variability, suggesting right atrial pressure of 3 mmHg.     Assessment and Plan   1. History of cardiomyopathy - prior mild LV dysfunction that normalized, most recent  echo shows some recurrent mild dysfunction, LVEF 45% - he is on coreg, losartan, farxiga. I worry about his CKD and changing to entresto, continue losartan for now. Will add aldactone 12.37m daily, check bmet 2 weeks.      JArnoldo Lenis M.D., F.A.C.C.

## 2022-06-15 ENCOUNTER — Encounter: Payer: Medicare Other | Admitting: Cardiovascular Disease

## 2022-06-19 ENCOUNTER — Encounter: Payer: Self-pay | Admitting: Cardiology

## 2022-06-19 ENCOUNTER — Ambulatory Visit: Payer: Medicare Other | Attending: Cardiology | Admitting: Cardiology

## 2022-06-19 ENCOUNTER — Encounter: Payer: Self-pay | Admitting: *Deleted

## 2022-06-19 VITALS — BP 135/82 | HR 77 | Ht 71.0 in | Wt 191.0 lb

## 2022-06-19 DIAGNOSIS — I10 Essential (primary) hypertension: Secondary | ICD-10-CM | POA: Diagnosis not present

## 2022-06-19 DIAGNOSIS — I5022 Chronic systolic (congestive) heart failure: Secondary | ICD-10-CM | POA: Diagnosis not present

## 2022-06-19 DIAGNOSIS — Z8679 Personal history of other diseases of the circulatory system: Secondary | ICD-10-CM | POA: Diagnosis present

## 2022-06-19 DIAGNOSIS — I442 Atrioventricular block, complete: Secondary | ICD-10-CM | POA: Diagnosis not present

## 2022-06-19 NOTE — Progress Notes (Signed)
Clinical Summary Troy Petty is a 72 y.o.male seen today for follow up of the following medical problems.   1. History of cardiomyopathy/ Low normal to mildly decreased LV systolic function   - echo 09/2013 LVEF Q000111Q, grade I diastolic dysfunction. Apex appears to be hypokinetic though poor visualization   - prior cath in [facility] 10/2012 with only mid LAD 20% lesion, otherwise patent vessels.   - repeat echo 10/2014 with LVEF 60-65%, no WMAs. - Toprol decreased during EP visit due to mild SOB. Breathing improved lower dose Toprol.       Dec 2023 echo: LVEF 45%, grade I DD,  - he is on coreg, farxiga, losartan. Last Cr 1.7 - no recent edema, DOE with moderate levels of activity which is stable.    - some SOB activities that is chronic, sedentary lifestyle - no recent edema - compliant with meds        2. Non-obstructive CAD   - mild LAD lesion from cath 2014   - no chest pains       3. Complete heart block   - St Jude pacemaker followed by Dr Troy Petty - plans for BiV upgrade per EP         4. HTN   - reports neprhologist  started losartan 57m daily - home bp's typically 130s/70s     5. AAA screen - 01/2017 no AAA     6. CKD - [facility] - baseline Cr 1.7 Past Medical History:  Diagnosis Date   Arthritis    Troy Petty   Chronic kidney disease, stage III (moderate) (HCC)    Chronic renal insufficiency    baseline creatinine is 1.8   Chronic systolic dysfunction of left ventricle 4/15   EF 40-45% in setting of PMT on limited echo   Complete heart block (HCC)    COPD (chronic obstructive pulmonary disease) ([facility]    Coronary artery disease    NON OBSTRUCTIVE CAD; Troy Petty   GERD (gastroesophageal reflux disease)    Hemorrhoids    Hypertension    Hypertrophy of prostate with urinary obstruction and other lower urinary tract symptoms (LUTS)    INDWELLING  FOLEY CATHETER   Metabolic syndrome    Mild pulmonary hypertension ([facility]    Pacemaker    IMPLANTED Mar 14, 2013 - Troy Petty   Pain    BACK PAIN AT TIMES   Shortness of breath    WITH EXERTION   Vitamin B12 deficiency      No Known Allergies   Current Outpatient Medications  Medication Sig Dispense Refill   albuterol (VENTOLIN HFA) 108 (90 Base) MCG/ACT inhaler Inhale 2 puffs every 4 hours by inhalation route. (Patient not taking: Reported on 05/19/2022)     amLODipine (NORVASC) 10 MG tablet TAKE 1 TABLET BY MOUTH EVERY DAY 90 tablet 3   aspirin 81 MG EC tablet Take 8 mg by mouth daily.     carvedilol (COREG) 3.125 MG tablet TAKE 1 TABLET BY MOUTH 2 TIMES DAILY. *STOP METOPROLOL* 180 tablet 2   Ergocalciferol (VITAMIN D2 PO) Take by mouth.     JARDIANCE 25 MG TABS tablet Take 25 mg by mouth daily.     losartan (COZAAR) 50 MG tablet Take 50 mg by mouth daily.     Magnesium 400 MG TABS Take 1 tablet by mouth  daily. (Patient not taking: Reported on 05/19/2022)     omeprazole (PRILOSEC) 40 MG capsule Take 1 capsule by mouth daily as needed.      spironolactone (ALDACTONE) 25 MG tablet TAKE 1/2 TABLET BY MOUTH EVERY DAY 45 tablet 2   SYMBICORT 160-4.5 MCG/ACT inhaler Inhale 2 puffs into the lungs 2 (two) times daily. (Patient not taking: Reported on 05/19/2022)     No current facility-administered medications for this visit.     Past Surgical History:  Procedure Laterality Date   CARDIAC CATHETERIZATION  2014   COLON SURGERY     COLON RESECTION FOR BLOCKAGE   COLONOSCOPY N/A 04/12/2016   Troy Petty: 10 mm polyp, two 4 mm polyps at transverse, tubulovillous adenoma, surveillance in 3 years.    COLONOSCOPY N/A 04/12/2016   Troy Petty: post-polypectomy bleed s/p clips at transverse colon    GREEN LIGHT LASER TURP (TRANSURETHRAL RESECTION OF PROSTATE N/A 02/13/2014   Procedure: GREEN LIGHT LASER TURP (TRANSURETHRAL RESECTION OF PROSTATE;   Surgeon: Troy Aloe, MD;  Location: [facility];  Service: Urology;  Laterality: N/A;   OTHER SURGICAL HISTORY     Intestinal blockage x2   OTHER SURGICAL HISTORY  1964   Football injury   PACEMAKER INSERTION  03/14/13   SJM Endurity implanted by Dr Troy Petty in [place] for complete heart block     No Known Allergies    Family History  Problem Relation Age of Onset   Cancer Father    Colon cancer Father      Social History Troy Petty reports that he quit smoking about 11 years ago. His smoking use included cigarettes. He started smoking about 55 years ago. He has a 45.00 pack-year smoking history. He has never used smokeless tobacco. Troy Petty reports no history of alcohol use.   Review of Systems CONSTITUTIONAL: No weight loss, fever, chills, weakness or fatigue.  HEENT: Eyes: No visual loss, blurred vision, double vision or yellow sclerae.No hearing loss, sneezing, congestion, runny nose or sore throat.  SKIN: No rash or itching.  CARDIOVASCULAR: per hpi RESPIRATORY: No shortness of breath, cough or sputum.  GASTROINTESTINAL: No anorexia, nausea, vomiting or diarrhea. No abdominal pain or blood.  GENITOURINARY: No burning on urination, no polyuria NEUROLOGICAL: No headache, dizziness, syncope, paralysis, ataxia, numbness or tingling in the extremities. No change in bowel or bladder control.  MUSCULOSKELETAL: No muscle, back pain, joint pain or stiffness.  LYMPHATICS: No enlarged nodes. No history of splenectomy.  PSYCHIATRIC: No history of depression or anxiety.  ENDOCRINOLOGIC: No reports of sweating, cold or heat intolerance. No polyuria or polydipsia.  Marland Kitchen   Physical Examination Today's Vitals   06/19/22 0938  BP: 135/82  Pulse: 77  SpO2: 93%  Weight: 191 lb (86.6 kg)  Height: 5' 11"$  (1.803 m)   Body mass index is 26.64 kg/m.  Gen: resting comfortably, no acute distress HEENT: no scleral icterus, pupils equal round and reactive, no palptable cervical  adenopathy,  CV: RRR, no m/r/g, no jvd Resp: Clear to auscultation bilaterally GI: abdomen is soft, non-tender, non-distended, normal bowel sounds, no hepatosplenomegaly MSK: extremities are warm, no edema.  Skin: warm, no rash Neuro:  no focal deficits Psych: appropriate affect   Diagnostic Studies  09/2013 Echo   Study Conclusions  - Left ventricle: The cavity size was normal. Wall thickness was normal. Systolic function was mildly reduced. The estimated ejection fraction was in the range of 45% to 50%. Doppler parameters are consistent with abnormal left ventricular relaxation (grade  1 diastolic dysfunction). - Regional wall motion abnormality: Several of the views are foreshortened, however overall the apex does appear hypokinetic. Hypokinesis of the apical anterior, apical inferior, apical septal, apical lateral, and apical myocardium. Can consider echo with contrast for more definitive evaluation of wall motion. - Aortic valve: Mildly calcified annulus. Trileaflet; mildly thickened leaflets. Valve area (VTI): 3.76 cm^2. Valve area (Vmax): 3.65 cm^2. - Mitral valve: Mildly calcified annulus. Mildly thickened leaflets . 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 - Systemic veins: The IVC is small, suggesting low RA pressures and hypovolemia.    10/2012 Cath [facility] [facility]   RHC: RA 5, PA 40/12 (mean not reported) PCWP 12, PA sat 75%.   LHC: LM patent, LAD mid 20%, LCX patent, RCA anomolous origine from left cusp patent. LVEF 70%.     10/2014 echo Study Conclusions   - Left ventricle: The cavity size was normal. Wall thickness was   increased in a pattern of mild LVH. Systolic function was normal.   The estimated ejection fraction was in the range of 60% to 65%.   Wall motion was normal; there were no regional wall motion   abnormalities. Doppler parameters are consistent with abnormal   left ventricular relaxation (grade 1 diastolic dysfunction). -  Aortic valve: Mildly calcified annulus. Trileaflet; mildly   thickened leaflets. Valve area (VTI): 4.75 cm^2. Valve area   (Vmax): 3.73 cm^2. Valve area (Vmean): 3.54 cm^2. - Mitral valve: Mildly calcified annulus. Mildly thickened leaflets   . - Left atrium: The atrium was mildly dilated. - Atrial septum: No defect or patent foramen ovale was identified. - Pulmonary arteries: Systolic pressure was moderately increased.   PA peak pressure: 44 mm Hg (S). - Technically adequate study.   Jan 2023 echo IMPRESSIONS     1. Left ventricular ejection fraction, by estimation, is 45%. The left  ventricle has mildly decreased function. Left ventricular endocardial  border not optimally defined to evaluate regional wall motion. Left  ventricular diastolic parameters are  consistent with Grade I diastolic dysfunction (impaired relaxation).   2. Right ventricular systolic function is normal. The right ventricular  size is normal. There is normal pulmonary artery systolic pressure.   3. The mitral valve is normal in structure. No evidence of mitral valve  regurgitation. No evidence of mitral stenosis.   4. The tricuspid valve is abnormal.   5. The aortic valve has an indeterminant number of cusps. There is mild  calcification of the aortic valve. There is mild thickening of the aortic  valve. Aortic valve regurgitation is not visualized. No aortic stenosis is  present.   6. The inferior vena cava is normal in size with greater than 50%  respiratory variability, suggesting right atrial pressure of 3 mmHg.      03/2022 echo IMPRESSIONS     1. Left ventricular ejection fraction, by estimation, is 45%. The left  ventricle has mildly decreased function. The left ventricle demonstrates  regional wall motion abnormalities (see scoring diagram/findings for  description). Left ventricular diastolic   parameters are consistent with Grade I diastolic dysfunction (impaired  relaxation).   2. Right  ventricular systolic function is normal. The right ventricular  size is normal.   3. The mitral valve is normal in structure. Trivial mitral valve  regurgitation. No evidence of mitral stenosis.   4. The aortic valve is tricuspid. Aortic valve regurgitation is not  visualized. No aortic stenosis is present.   5. The inferior vena cava is  normal in size with greater than 50%  respiratory variability, suggesting right atrial pressure of 3 mmHg.   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 I worry about his CKD and changing to entresto - continue current meds at this time, request labs from pcp. Perhaps pending renal function over time consider entresto, titration of aldactone - euvolemic today, no significant symptoms  2. HTN - at goal, continue current meds  3. Complete heart block - plans for BiV upgrade per Dr Troy Petty     Arnoldo Lenis, M.D.

## 2022-06-19 NOTE — Patient Instructions (Signed)
Medication Instructions:  Continue all current medications.   Labwork: none  Testing/Procedures: none  Follow-Up: 6 months   Any Other Special Instructions Will Be Listed Below (If Applicable).   If you need a refill on your cardiac medications before your next appointment, please call your pharmacy.  

## 2022-06-22 ENCOUNTER — Encounter: Payer: Self-pay | Admitting: Cardiology

## 2022-06-26 ENCOUNTER — Other Ambulatory Visit: Payer: Self-pay | Admitting: Nurse Practitioner

## 2022-06-26 DIAGNOSIS — C61 Malignant neoplasm of prostate: Secondary | ICD-10-CM

## 2022-07-10 ENCOUNTER — Other Ambulatory Visit (HOSPITAL_COMMUNITY): Payer: Self-pay | Admitting: Nurse Practitioner

## 2022-07-10 DIAGNOSIS — C61 Malignant neoplasm of prostate: Secondary | ICD-10-CM

## 2022-07-13 NOTE — Progress Notes (Signed)
Remote pacemaker transmission.   

## 2022-07-28 ENCOUNTER — Other Ambulatory Visit: Payer: Self-pay | Admitting: Cardiology

## 2022-08-04 ENCOUNTER — Telehealth: Payer: Self-pay

## 2022-08-04 ENCOUNTER — Telehealth: Payer: Self-pay | Admitting: Cardiovascular Disease

## 2022-08-04 DIAGNOSIS — Z950 Presence of cardiac pacemaker: Secondary | ICD-10-CM

## 2022-08-04 DIAGNOSIS — I442 Atrioventricular block, complete: Secondary | ICD-10-CM

## 2022-08-04 DIAGNOSIS — Z01812 Encounter for preprocedural laboratory examination: Secondary | ICD-10-CM

## 2022-08-04 NOTE — Telephone Encounter (Signed)
Fax number: [phone] - PCP

## 2022-08-04 NOTE — Telephone Encounter (Signed)
Patient would like for blood work to be done by primary care. Would like to see if orders could be sent to [facility]. Patient's wife will call back to give fax number of PCP

## 2022-08-04 NOTE — Telephone Encounter (Signed)
Spoke with patient about changing time on his BiV upgrade placement. Cath lab notified, letter updated and mailed to patient. No questions at this time

## 2022-08-04 NOTE — Telephone Encounter (Signed)
Spoke w patient's wife. They are going right now to PCP office.  Their provider is [facility], [place]. I faxed order reqs for cbc w diff and bmet requesting results be faxed asap to Dr. Nelly Laurence at [phone].

## 2022-08-05 LAB — CBC WITH DIFFERENTIAL
Basophils Absolute: 0 10*3/uL (ref 0.0–0.2)
Basos: 1 %
EOS (ABSOLUTE): 0.2 10*3/uL (ref 0.0–0.4)
Eos: 3 %
Hematocrit: 48 % (ref 37.5–51.0)
Hemoglobin: 14.7 g/dL (ref 13.0–17.7)
Immature Grans (Abs): 0 10*3/uL (ref 0.0–0.1)
Immature Granulocytes: 0 %
Lymphocytes Absolute: 1.4 10*3/uL (ref 0.7–3.1)
Lymphs: 17 %
MCH: 26.4 pg — ABNORMAL LOW (ref 26.6–33.0)
MCHC: 30.6 g/dL — ABNORMAL LOW (ref 31.5–35.7)
MCV: 86 fL (ref 79–97)
Monocytes Absolute: 0.7 10*3/uL (ref 0.1–0.9)
Monocytes: 8 %
Neutrophils Absolute: 5.7 10*3/uL (ref 1.4–7.0)
Neutrophils: 71 %
RBC: 5.56 x10E6/uL (ref 4.14–5.80)
RDW: 14.4 % (ref 11.6–15.4)
WBC: 8 10*3/uL (ref 3.4–10.8)

## 2022-08-05 LAB — BASIC METABOLIC PANEL
BUN/Creatinine Ratio: 9 — ABNORMAL LOW (ref 10–24)
BUN: 15 mg/dL (ref 8–27)
CO2: 19 mmol/L — ABNORMAL LOW (ref 20–29)
Calcium: 9.3 mg/dL (ref 8.6–10.2)
Chloride: 104 mmol/L (ref 96–106)
Creatinine, Ser: 1.62 mg/dL — ABNORMAL HIGH (ref 0.76–1.27)
Glucose: 117 mg/dL — ABNORMAL HIGH (ref 70–99)
Potassium: 4.4 mmol/L (ref 3.5–5.2)
Sodium: 140 mmol/L (ref 134–144)
eGFR: 45 mL/min/{1.73_m2} — ABNORMAL LOW (ref >59)

## 2022-08-18 NOTE — Pre-Procedure Instructions (Signed)
Instructed patient on the following items: Arrival time 1:30 Nothing to eat or drink after midnight No meds AM of procedure Responsible person to drive you home and stay with you for 24 hrs Wash with special soap night before and morning of procedure  

## 2022-08-21 ENCOUNTER — Other Ambulatory Visit: Payer: Self-pay

## 2022-08-21 ENCOUNTER — Ambulatory Visit (HOSPITAL_COMMUNITY)
Admission: RE | Admit: 2022-08-21 | Discharge: 2022-08-21 | Disposition: A | Discharge status: Home / Self Care | Payer: Medicare Other | Attending: Cardiovascular Disease | Admitting: Cardiovascular Disease

## 2022-08-21 ENCOUNTER — Encounter (HOSPITAL_COMMUNITY)
Admission: RE | Disposition: A | Discharge status: Home / Self Care | Payer: Self-pay | Source: Home / Self Care | Attending: Cardiovascular Disease

## 2022-08-21 ENCOUNTER — Ambulatory Visit (HOSPITAL_COMMUNITY): Payer: Medicare Other

## 2022-08-21 DIAGNOSIS — Z45010 Encounter for checking and testing of cardiac pacemaker pulse generator [battery]: Secondary | ICD-10-CM

## 2022-08-21 DIAGNOSIS — N183 Chronic kidney disease, stage 3 unspecified: Secondary | ICD-10-CM | POA: Insufficient documentation

## 2022-08-21 DIAGNOSIS — I5022 Chronic systolic (congestive) heart failure: Secondary | ICD-10-CM

## 2022-08-21 DIAGNOSIS — I130 Hypertensive heart and chronic kidney disease with heart failure and stage 1 through stage 4 chronic kidney disease, or unspecified chronic kidney disease: Secondary | ICD-10-CM | POA: Insufficient documentation

## 2022-08-21 DIAGNOSIS — I5020 Unspecified systolic (congestive) heart failure: Secondary | ICD-10-CM | POA: Diagnosis not present

## 2022-08-21 DIAGNOSIS — I442 Atrioventricular block, complete: Secondary | ICD-10-CM | POA: Diagnosis not present

## 2022-08-21 DIAGNOSIS — I2510 Atherosclerotic heart disease of native coronary artery without angina pectoris: Secondary | ICD-10-CM | POA: Insufficient documentation

## 2022-08-21 HISTORY — PX: BIV UPGRADE: EP1202

## 2022-08-21 LAB — GLUCOSE, CAPILLARY
Glucose-Capillary: 108 mg/dL — ABNORMAL HIGH (ref 70–99)
Glucose-Capillary: 92 mg/dL (ref 70–99)

## 2022-08-21 SURGERY — BIV UPGRADE

## 2022-08-21 MED ORDER — CEFAZOLIN SODIUM-DEXTROSE 2-4 GM/100ML-% IV SOLN
INTRAVENOUS | Status: AC
  Filled 2022-08-21: qty 100

## 2022-08-21 MED ORDER — LIDOCAINE HCL (PF) 1 % IJ SOLN
INTRAMUSCULAR | Status: DC | PRN
  Administered 2022-08-21: 60 mL

## 2022-08-21 MED ORDER — SODIUM CHLORIDE 0.9 % IV SOLN: INTRAVENOUS | Status: DC

## 2022-08-21 MED ORDER — CEFAZOLIN SODIUM-DEXTROSE 2-4 GM/100ML-% IV SOLN
2.0000 g | INTRAVENOUS | Status: AC
  Administered 2022-08-21: 2 g via INTRAVENOUS

## 2022-08-21 MED ORDER — LIDOCAINE HCL 1 % IJ SOLN
INTRAMUSCULAR | Status: AC
  Filled 2022-08-21: qty 60

## 2022-08-21 MED ORDER — HEPARIN (PORCINE) IN NACL 1000-0.9 UT/500ML-% IV SOLN
INTRAVENOUS | Status: DC | PRN
  Administered 2022-08-21: 500 mL

## 2022-08-21 MED ORDER — ONDANSETRON HCL 4 MG/2ML IJ SOLN: 4.0000 mg | Freq: Four times a day (QID) | INTRAMUSCULAR | Status: DC | PRN

## 2022-08-21 MED ORDER — SODIUM CHLORIDE 0.9 % IV SOLN
80.0000 mg | INTRAVENOUS | Status: AC
  Administered 2022-08-21: 80 mg

## 2022-08-21 MED ORDER — CEFAZOLIN SODIUM-DEXTROSE 1-4 GM/50ML-% IV SOLN: 1.0000 g | Freq: Four times a day (QID) | INTRAVENOUS | Status: DC

## 2022-08-21 MED ORDER — FENTANYL CITRATE (PF) 100 MCG/2ML IJ SOLN
INTRAMUSCULAR | Status: DC | PRN
  Administered 2022-08-21: 25 ug via INTRAVENOUS

## 2022-08-21 MED ORDER — FENTANYL CITRATE (PF) 100 MCG/2ML IJ SOLN
INTRAMUSCULAR | Status: AC
  Filled 2022-08-21: qty 2

## 2022-08-21 MED ORDER — IOHEXOL 350 MG/ML SOLN
INTRAVENOUS | Status: DC | PRN
  Administered 2022-08-21: 25 mL

## 2022-08-21 MED ORDER — MIDAZOLAM HCL 5 MG/5ML IJ SOLN
INTRAMUSCULAR | Status: DC | PRN
  Administered 2022-08-21: 1 mg via INTRAVENOUS

## 2022-08-21 MED ORDER — POVIDONE-IODINE 10 % EX SWAB
2.0000 | Freq: Once | CUTANEOUS | Status: AC
  Administered 2022-08-21: 2 via TOPICAL

## 2022-08-21 MED ORDER — MIDAZOLAM HCL 5 MG/5ML IJ SOLN
INTRAMUSCULAR | Status: AC
  Filled 2022-08-21: qty 5

## 2022-08-21 MED ORDER — ACETAMINOPHEN 325 MG PO TABS: 325.0000 mg | ORAL_TABLET | ORAL | Status: DC | PRN

## 2022-08-21 MED ORDER — SODIUM CHLORIDE 0.9 % IV SOLN
INTRAVENOUS | Status: AC
  Filled 2022-08-21: qty 2

## 2022-08-21 SURGICAL SUPPLY — 17 items
BALLOON PA MONITOR CATH (BALLOONS) IMPLANT
CABLE SURGICAL S-101-97-12 (CABLE) ×1 IMPLANT
CATH CPS DIRECT 135 DS2C020 (CATHETERS) IMPLANT
KIT MICROPUNCTURE NIT STIFF (SHEATH) IMPLANT
LEAD QUARTET 1458Q-86CM (Lead) IMPLANT
PACEMAKER QUDR ALLR CRT PM3562 (Pacemaker) IMPLANT
PAD DEFIB RADIO PHYSIO CONN (PAD) ×1 IMPLANT
PMKR QUADRA ALLURE CRT PM3562 (Pacemaker) ×1 IMPLANT
POUCH AIGIS-R ANTIBACT ICD (Mesh General) ×1 IMPLANT
POUCH AIGIS-R ANTIBACT ICD LRG (Mesh General) IMPLANT
SHEATH 9.5FR PRELUDE SNAP 13 (SHEATH) IMPLANT
SHEATH PINNACLE 6F 10CM (SHEATH) IMPLANT
SHEATH PROBE COVER 6X72 (BAG) IMPLANT
SLITTER AGILIS HISPRO (INSTRUMENTS) IMPLANT
TRAY PACEMAKER INSERTION (PACKS) ×1 IMPLANT
WIRE ACUITY WHISPER EDS 4648 (WIRE) IMPLANT
WIRE HI TORQ VERSACORE-J 145CM (WIRE) IMPLANT

## 2022-08-21 NOTE — Progress Notes (Signed)
CXR results reported to [facility], MD. Patient okay to D/C.

## 2022-08-21 NOTE — Discharge Instructions (Signed)
After Your Pacemaker   You have a Abbott Pacemaker  ACTIVITY Do not lift your arm above shoulder height for 1 week after your procedure. After 7 days, you may progress as below.  You should remove your sling 24 hours after your procedure, unless otherwise instructed by your provider.     Monday August 28, 2022  Tuesday August 29, 2022 Wednesday Aug 30, 2022 Thursday Aug 31, 2022   Do not lift, push, pull, or carry anything over 10 pounds with the affected arm until 6 weeks (Monday October 02, 2022 ) after your procedure.   You may drive AFTER your wound check, unless you have been told otherwise by your provider.   Ask your healthcare provider when you can go back to work   INCISION/Dressing If you are on a blood thinner such as Coumadin, Xarelto, Eliquis, Plavix, or Pradaxa please confirm with your provider when this should be resumed.   If large square, outer bandage is left in place, this can be removed after 24 hours from your procedure. Do not remove steri-strips or glue as below.   If a PRESSURE DRESSING (a bulky dressing that usually goes up over your shoulder) was applied or left in place, please follow instructions given by your provider on when to return to have this removed.   Monitor your Pacemaker site for redness, swelling, and drainage. Call the device clinic at [phone] if you experience these symptoms or fever/chills.  If your incision is sealed with Steri-strips or staples, you may shower 7 days after your procedure or when told by your provider. Do not remove the steri-strips or let the shower hit directly on your site. You may wash around your site with soap and water.    If you were discharged in a sling, please do not wear this during the day more than 48 hours after your surgery unless otherwise instructed. This may increase the risk of stiffness and soreness in your shoulder.   Avoid lotions, ointments, or perfumes over your incision until it is well-healed.  You  may use a hot tub or a pool AFTER your wound check appointment if the incision is completely closed.  Pacemaker Alerts:  Some alerts are vibratory and others beep. These are NOT emergencies. Please call our office to let us know. If this occurs at night or on weekends, it can wait until the next business day. Send a remote transmission.  If your device is capable of reading fluid status (for heart failure), you will be offered monthly monitoring to review this with you.   DEVICE MANAGEMENT Remote monitoring is used to monitor your pacemaker from home. This monitoring is scheduled every 91 days by our office. It allows [place] to keep an eye on the functioning of your device to ensure it is working properly. You will routinely see your Electrophysiologist annually (more often if necessary).   You should receive your ID card for your new device in 4-8 weeks. Keep this card with you at all times once received. Consider wearing a medical alert bracelet or necklace.  Your Pacemaker may be MRI compatible. This will be discussed at your next office visit/wound check.  You should avoid contact with strong electric or magnetic fields.   Do not use amateur (ham) radio equipment or electric (arc) welding torches. MP3 player headphones with magnets should not be used. Some devices are safe to use if held at least 12 inches (30 cm) from your Pacemaker. These include power tools, lawn  mowers, and speakers. If you are unsure if something is safe to use, ask your health care provider.  When using your cell phone, hold it to the ear that is on the opposite side from the Pacemaker. Do not leave your cell phone in a pocket over the Pacemaker.  You may safely use electric blankets, heating pads, computers, and microwave ovens.  Call the office right away if: You have chest pain. You feel more short of breath than you have felt before. You feel more light-headed than you have felt before. Your incision starts to open  up.  This information is not intended to replace advice given to you by your health care provider. Make sure you discuss any questions you have with your health care provider.  

## 2022-08-21 NOTE — H&P (Signed)
PCP: Joaquin Courts, DO   Primary EP:  Dr Nelly Laurence  Troy Petty is a 72 y.o. male who presents today for routine electrophysiology followup.  Since last being seen in our clinic, the patient reports doing reasonably well.   He is not very active.  He has SOB with moderate activity.  This is chronic.   05/19/2022 He presents for follow-up after TTE, performed 03/2022 that showed persistently reduced EF of 45%.  Today, he denies symptoms of palpitations, chest pain,  lower extremity edema, dizziness, presyncope, or syncope.  The patient is otherwise without complaint today.   He continues to note fatigue though. He notices that he has less exertional capacity even when wearing just a heavy coat or thick coveralls.  He has not had any changes in medication or condition since our last visit.  Past Medical History:  Diagnosis Date   Arthritis    MIDDLE FINGER AND RING FINGER BOTH HANDS   Chronic kidney disease, stage III (moderate) (HCC)    Chronic renal insufficiency    baseline creatinine is 1.8   Chronic systolic dysfunction of left ventricle 4/15   EF 40-45% in setting of PMT on limited echo   Complete heart block (HCC)    COPD (chronic obstructive pulmonary disease) (HCC)    Coronary artery disease    NON OBSTRUCTIVE CAD; DR. Wyline Mood WITH [facility] IS PT'S CARDIOLOGIST   GERD (gastroesophageal reflux disease)    Hemorrhoids    Hypertension    Hypertrophy of prostate with urinary obstruction and other lower urinary tract symptoms (LUTS)    INDWELLING FOLEY CATHETER   Metabolic syndrome    Mild pulmonary hypertension (HCC)    Pacemaker    IMPLANTED Mar 14, 2013 - DR. ALLRED WITH [facility] FOLLOWS DEVICE FUNCTIONING   Pain    BACK PAIN AT TIMES   Shortness of breath    WITH EXERTION   Vitamin B12 deficiency    Past Surgical History:  Procedure Laterality Date   CARDIAC CATHETERIZATION  2014   COLON SURGERY     COLON RESECTION FOR BLOCKAGE    COLONOSCOPY N/A 04/12/2016   Dr. Jena Gauss: 10 mm polyp, two 4 mm polyps at transverse, tubulovillous adenoma, surveillance in 3 years.    COLONOSCOPY N/A 04/12/2016   Dr. Karilyn Cota: post-polypectomy bleed s/p clips at transverse colon    GREEN LIGHT LASER TURP (TRANSURETHRAL RESECTION OF PROSTATE N/A 02/13/2014   Procedure: GREEN LIGHT LASER TURP (TRANSURETHRAL RESECTION OF PROSTATE;  Surgeon: Jerilee Field, MD;  Location: [facility];  Service: Urology;  Laterality: N/A;   OTHER SURGICAL HISTORY     Intestinal blockage x2   OTHER SURGICAL HISTORY  1964   Football injury   PACEMAKER INSERTION  03/14/13   SJM Endurity implanted by Dr Nedra Hai in [place] for complete heart block    ROS- all systems are reviewed and negative except as per HPI above  Current Facility-Administered Medications  Medication Dose Route Frequency Provider Last Rate Last Admin   0.9 %  sodium chloride infusion   Intravenous Continuous Malayna Noori, Roberts Gaudy, MD       ceFAZolin (ANCEF) IVPB 2g/100 mL premix  2 g Intravenous On Call Moishy Laday, Roberts Gaudy, MD       gentamicin (GARAMYCIN) 80 mg in sodium chloride 0.9 % 500 mL irrigation  80 mg Irrigation On Call Tekila Caillouet, Roberts Gaudy, MD       povidone-iodine 10 % swab 2 Application  2 Application Topical Once  Jaloni Sorber, Roberts Gaudy, MD        Physical Exam: There were no vitals filed for this visit.   GEN- The patient is well appearing, alert and oriented x 3 today.   Lungs- normal work of breathing Chest- pacemaker pocket is well healed Heart- Regular rate and rhythm, no murmurs, rubs or gallops Extremities- no clubbing, cyanosis, or edema  Pacemaker interrogation- reviewed in detail today,  See PACEART report  ekg tracing ordered today is personally reviewed and shows sinus with V pacing and occasional atrial pacing   Assessment and Plan:  1. Symptomatic complete heart block Chronic RA noise is noted  2. CAD (nonobstructive) No ischemic symptoms No changes  3.  HTN Stable No change required today  4. CHF moderately reduced EF: EF remains reduced at 45%. Symptomatic with NYHA II symptoms. I think he will benefit from improved V-V synchrony. I discussed the indication for the LV upgrade procedure and the logistics, risks, potential benefit, and after care. I specifically explained that risks include but are not limited to infection, bleeding,damage to blood vessels, lung, and the heart -- but risk of prolonged hospitalization, need for surgery, or the event of stroke, heart attack, or death are low but not zero.   5. Atrial high rate episodes: lasting less than 1 minute. Continue to monitor.  6. Chronic diastolic dysfunction EF normal by echo 2016 Clinically stable Follows with Dr Otho Ket, MD 08/21/2022 1:41 PM

## 2022-08-22 ENCOUNTER — Encounter (HOSPITAL_COMMUNITY): Payer: Self-pay | Admitting: Cardiovascular Disease

## 2022-08-29 ENCOUNTER — Other Ambulatory Visit: Payer: Self-pay | Admitting: Cardiology

## 2022-08-31 ENCOUNTER — Ambulatory Visit: Payer: Medicare Other | Attending: Internal Medicine

## 2022-08-31 DIAGNOSIS — I442 Atrioventricular block, complete: Secondary | ICD-10-CM | POA: Insufficient documentation

## 2022-08-31 LAB — CUP PACEART INCLINIC DEVICE CHECK
Battery Remaining Longevity: 70 mo
Battery Voltage: 2.99 V
Brady Statistic RA Percent Paced: 7.8 %
Brady Statistic RV Percent Paced: 99.96 %
Date Time Interrogation Session: 20240502123556
Implantable Lead Connection Status: 753985
Implantable Lead Connection Status: 753985
Implantable Lead Connection Status: 753985
Implantable Lead Implant Date: 20141114
Implantable Lead Implant Date: 20141114
Implantable Lead Implant Date: 20240422
Implantable Lead Location: 753858
Implantable Lead Location: 753859
Implantable Lead Location: 753860
Implantable Pulse Generator Implant Date: 20240422
Lead Channel Impedance Value: 312.5 Ohm
Lead Channel Impedance Value: 512.5 Ohm
Lead Channel Impedance Value: 787.5 Ohm
Lead Channel Pacing Threshold Amplitude: 0.75 V
Lead Channel Pacing Threshold Amplitude: 0.75 V
Lead Channel Pacing Threshold Amplitude: 0.75 V
Lead Channel Pacing Threshold Amplitude: 0.75 V
Lead Channel Pacing Threshold Amplitude: 1.25 V
Lead Channel Pacing Threshold Amplitude: 1.25 V
Lead Channel Pacing Threshold Pulse Width: 0.5 ms
Lead Channel Pacing Threshold Pulse Width: 0.5 ms
Lead Channel Pacing Threshold Pulse Width: 0.5 ms
Lead Channel Pacing Threshold Pulse Width: 0.5 ms
Lead Channel Pacing Threshold Pulse Width: 0.5 ms
Lead Channel Pacing Threshold Pulse Width: 0.5 ms
Lead Channel Sensing Intrinsic Amplitude: 12 mV
Lead Channel Sensing Intrinsic Amplitude: 5 mV
Lead Channel Setting Pacing Amplitude: 2.5 V
Lead Channel Setting Pacing Amplitude: 2.5 V
Lead Channel Setting Pacing Amplitude: 3.5 V
Lead Channel Setting Pacing Pulse Width: 0.5 ms
Lead Channel Setting Pacing Pulse Width: 0.5 ms
Lead Channel Setting Sensing Sensitivity: 4 mV
Pulse Gen Model: 3562
Pulse Gen Serial Number: 8126985

## 2022-08-31 NOTE — Patient Instructions (Signed)

## 2022-08-31 NOTE — Progress Notes (Signed)

## 2022-09-13 ENCOUNTER — Ambulatory Visit: Payer: Medicare Other

## 2022-11-23 ENCOUNTER — Ambulatory Visit: Payer: Medicare Other

## 2022-11-23 DIAGNOSIS — I442 Atrioventricular block, complete: Secondary | ICD-10-CM

## 2022-11-23 LAB — CUP PACEART REMOTE DEVICE CHECK
Battery Remaining Longevity: 71 mo
Battery Remaining Percentage: 95.5 %
Battery Voltage: 2.99 V
Brady Statistic AP VP Percent: 7.9 %
Brady Statistic AP VS Percent: 1 %
Brady Statistic AS VP Percent: 92 %
Brady Statistic AS VS Percent: 1 %
Brady Statistic RA Percent Paced: 7.1 %
Date Time Interrogation Session: 20240725033050
Implantable Lead Connection Status: 753985
Implantable Lead Connection Status: 753985
Implantable Lead Connection Status: 753985
Implantable Lead Implant Date: 20141114
Implantable Lead Implant Date: 20141114
Implantable Lead Implant Date: 20240422
Implantable Lead Location: 753858
Implantable Lead Location: 753859
Implantable Lead Location: 753860
Implantable Pulse Generator Implant Date: 20240422
Lead Channel Impedance Value: 300 Ohm
Lead Channel Impedance Value: 560 Ohm
Lead Channel Impedance Value: 960 Ohm
Lead Channel Pacing Threshold Amplitude: 0.75 V
Lead Channel Pacing Threshold Amplitude: 0.75 V
Lead Channel Pacing Threshold Amplitude: 1.25 V
Lead Channel Pacing Threshold Pulse Width: 0.5 ms
Lead Channel Pacing Threshold Pulse Width: 0.5 ms
Lead Channel Pacing Threshold Pulse Width: 0.5 ms
Lead Channel Sensing Intrinsic Amplitude: 12 mV
Lead Channel Sensing Intrinsic Amplitude: 5 mV
Lead Channel Setting Pacing Amplitude: 2.5 V
Lead Channel Setting Pacing Amplitude: 2.5 V
Lead Channel Setting Pacing Amplitude: 3.5 V
Lead Channel Setting Pacing Pulse Width: 0.5 ms
Lead Channel Setting Pacing Pulse Width: 0.5 ms
Lead Channel Setting Sensing Sensitivity: 4 mV
Pulse Gen Model: 3562
Pulse Gen Serial Number: 8126985

## 2022-12-01 ENCOUNTER — Encounter: Payer: Self-pay | Admitting: Cardiovascular Disease

## 2022-12-01 ENCOUNTER — Ambulatory Visit: Payer: Medicare Other | Attending: Cardiovascular Disease | Admitting: Cardiovascular Disease

## 2022-12-01 VITALS — BP 118/70 | HR 69 | Ht 71.0 in | Wt 192.0 lb

## 2022-12-01 DIAGNOSIS — I10 Essential (primary) hypertension: Secondary | ICD-10-CM | POA: Insufficient documentation

## 2022-12-01 LAB — CUP PACEART INCLINIC DEVICE CHECK
Battery Remaining Longevity: 69 mo
Battery Voltage: 2.98 V
Brady Statistic RA Percent Paced: 7.2 %
Brady Statistic RV Percent Paced: 99.96 %
Date Time Interrogation Session: 20240802184747
Implantable Lead Connection Status: 753985
Implantable Lead Connection Status: 753985
Implantable Lead Connection Status: 753985
Implantable Lead Implant Date: 20141114
Implantable Lead Implant Date: 20141114
Implantable Lead Implant Date: 20240422
Implantable Lead Location: 753858
Implantable Lead Location: 753859
Implantable Lead Location: 753860
Implantable Pulse Generator Implant Date: 20240422
Lead Channel Impedance Value: 337.5 Ohm
Lead Channel Impedance Value: 550 Ohm
Lead Channel Impedance Value: 962.5 Ohm
Lead Channel Pacing Threshold Amplitude: 0.5 V
Lead Channel Pacing Threshold Amplitude: 0.5 V
Lead Channel Pacing Threshold Amplitude: 0.5 V
Lead Channel Pacing Threshold Amplitude: 0.5 V
Lead Channel Pacing Threshold Amplitude: 1 V
Lead Channel Pacing Threshold Amplitude: 1 V
Lead Channel Pacing Threshold Pulse Width: 0.5 ms
Lead Channel Pacing Threshold Pulse Width: 0.5 ms
Lead Channel Pacing Threshold Pulse Width: 0.5 ms
Lead Channel Pacing Threshold Pulse Width: 0.5 ms
Lead Channel Pacing Threshold Pulse Width: 0.5 ms
Lead Channel Pacing Threshold Pulse Width: 0.5 ms
Lead Channel Sensing Intrinsic Amplitude: 12 mV
Lead Channel Sensing Intrinsic Amplitude: 5 mV
Lead Channel Setting Pacing Amplitude: 2.5 V
Lead Channel Setting Pacing Amplitude: 2.5 V
Lead Channel Setting Pacing Amplitude: 3.5 V
Lead Channel Setting Pacing Pulse Width: 0.5 ms
Lead Channel Setting Pacing Pulse Width: 0.5 ms
Lead Channel Setting Sensing Sensitivity: 4 mV
Pulse Gen Model: 3562
Pulse Gen Serial Number: 8126985

## 2022-12-01 NOTE — Patient Instructions (Signed)
Medication Instructions:  Continue all current medications.  Labwork: none  Testing/Procedures: none  Follow-Up: 1 year   Any Other Special Instructions Will Be Listed Below (If Applicable).  If you need a refill on your cardiac medications before your next appointment, please call your pharmacy.  

## 2022-12-01 NOTE — Progress Notes (Signed)
    PCP: Joaquin Courts, DO   Primary EP:  Dr Nelly Laurence  Troy Petty is a 72 y.o. male who presents today for routine electrophysiology followup.  Since last being seen in our clinic, the patient reports doing reasonably well.     He is not very active.  He has SOB with moderate activity.  This is chronic.   TTE performed 03/2022 that showed persistently reduced EF of 45%.  He noted fatigue with minimal exertion, even wearing a heavy coat made him tired.  He underwent upgrade to a CRT device on August 21, 2022.  QRS improved from about 190 ms to about 150 ms.      Today, he notices that his functional capacity is somewhat improved.  He went for a 2 mile walk with minimal fatigue. he has no device related complaints -- no new tenderness, drainage, redness.     Physical Exam: Vitals:   12/01/22 1345  BP: 118/70  Pulse: 69  SpO2: 92%  Weight: 192 lb (87.1 kg)  Height: 5\' 11"  (1.803 m)     GEN- The patient is well appearing, alert and oriented x 3 today.   Lungs- normal work of breathing Chest- pacemaker pocket is well healed Heart- Regular rate and rhythm, no murmurs, rubs or gallops Extremities- no clubbing, cyanosis, or edema  Pacemaker interrogation- reviewed in detail today,  See PACEART report  EKG Interpretation Date/Time:  Friday December 01 2022 13:53:47 EDT Ventricular Rate:  67 PR Interval:  174 QRS Duration:  154 QT Interval:  438 QTC Calculation: 462 R Axis:   241  Text Interpretation: Atrial-sensed ventricular-paced rhythm Biventricular pacemaker detected When compared with ECG of 21-Aug-2022 20:25, Vent. rate has decreased BY   6 BPM Confirmed by York Pellant [phone]) on 12/01/2022 2:10:07 PM    Assessment and Plan:  1. Symptomatic complete heart block Normal BiV pacemaker function, 99% BiV paced  See Pace Art report No changes today Chronic RA noise is noted he is device dependant today  2. CAD (nonobstructive) No ischemic symptoms No  changes  3. HTN Stable No change required today  4. CHF moderately reduced EF: EF remains reduced at 45%. Symptomatic with NYHA II symptoms.  5. Atrial high rate episodes: lasting less than 1 minute. Continue to monitor.  6. Chronic diastolic dysfunction EF normal by echo 2016 Clinically stable Follows with Dr Otho Ket, MD 12/01/2022 2:09 PM

## 2022-12-01 NOTE — Progress Notes (Signed)
Remote pacemaker transmission.   

## 2022-12-13 ENCOUNTER — Ambulatory Visit: Payer: Medicare Other

## 2022-12-19 ENCOUNTER — Ambulatory Visit: Payer: Medicare Other | Admitting: Cardiology

## 2022-12-19 NOTE — Progress Notes (Deleted)
Clinical Summary Troy Petty is a 72 y.o.male  seen today for follow up of the following medical problems.    1. History of cardiomyopathy/ Low normal to mildly decreased LV systolic function   - echo 09/2013 LVEF 45-50%, grade I diastolic dysfunction. Apex appears to be hypokinetic though poor visualization   - prior cath in [facility] 10/2012 with only mid LAD 20% lesion, otherwise patent vessels.   - repeat echo 10/2014 with LVEF 60-65%, no WMAs. - Toprol decreased during EP visit due to mild SOB. Breathing improved lower dose Toprol.       Dec 2023 echo: LVEF 45%, grade I DD,  He underwent upgrade to a CRT device on August 21, 2022      2. Non-obstructive CAD   - mild LAD lesion from cath 2014   - no chest pains       3. Complete heart block   - St Jude pacemaker followed by Dr Nelly Laurence - plans for BiV upgrade per EP      He underwent upgrade to a CRT device on August 21, 2022    4. HTN   - reports neprhologist  started losartan 50mg  daily - home bp's typically 130s/70s     5. AAA screen - 01/2017 no AAA     6. CKD - followed by [facility] - baseline Cr 1.7    Past Medical History:  Diagnosis Date   Arthritis    MIDDLE FINGER AND RING FINGER BOTH HANDS   Chronic kidney disease, stage III (moderate) (HCC)    Chronic renal insufficiency    baseline creatinine is 1.8   Chronic systolic dysfunction of left ventricle 4/15   EF 40-45% in setting of PMT on limited echo   Complete heart block (HCC)    COPD (chronic obstructive pulmonary disease) (HCC)    Coronary artery disease    NON OBSTRUCTIVE CAD; DR. Wyline Mood WITH [facility] IS PT'S CARDIOLOGIST   GERD (gastroesophageal reflux disease)    Hemorrhoids    Hypertension    Hypertrophy of prostate with urinary obstruction and other lower urinary tract symptoms (LUTS)    INDWELLING FOLEY CATHETER   Metabolic syndrome    Mild pulmonary hypertension (HCC)    Pacemaker    IMPLANTED Mar 14, 2013 - DR. ALLRED WITH [facility] FOLLOWS DEVICE FUNCTIONING   Pain    BACK PAIN AT TIMES   Shortness of breath    WITH EXERTION   Vitamin B12 deficiency      No Known Allergies   Current Outpatient Medications  Medication Sig Dispense Refill   acetaminophen (TYLENOL) 500 MG tablet Take 500 mg by mouth every 6 (six) hours as needed for headache or moderate pain.     albuterol (VENTOLIN HFA) 108 (90 Base) MCG/ACT inhaler Inhale 2 puffs into the lungs every 6 (six) hours as needed for wheezing or shortness of breath.     amLODipine (NORVASC) 10 MG tablet TAKE 1 TABLET BY MOUTH EVERY DAY 90 tablet 1   aspirin 81 MG EC tablet Take 81 mg by mouth daily.     carvedilol (COREG) 3.125 MG tablet TAKE 1 TABLET BY MOUTH 2 TIMES DAILY. *STOP METOPROLOL* 180 tablet 2   JARDIANCE 25 MG TABS tablet Take 25 mg by mouth daily.     losartan (COZAAR) 50 MG tablet Take 50 mg by mouth daily.     Magnesium 200 MG TABS Take 400 mg by mouth daily.  omeprazole (PRILOSEC) 40 MG capsule Take 1 capsule by mouth daily as needed.      spironolactone (ALDACTONE) 25 MG tablet Take 0.5 tablets (12.5 mg total) by mouth daily. 45 tablet 3   Vitamin D, Ergocalciferol, (DRISDOL) 1.25 MG (50000 UNIT) CAPS capsule Take 50,000 Units by mouth every Wednesday.     No current facility-administered medications for this visit.     Past Surgical History:  Procedure Laterality Date   BIV UPGRADE N/A 08/21/2022   Procedure: BIV PPM UPGRADE;  Surgeon: Mealor, Roberts Gaudy, MD;  Location: [facility];  Service: Cardiovascular;  Laterality: N/A;   CARDIAC CATHETERIZATION  2014   COLON SURGERY     COLON RESECTION FOR BLOCKAGE   COLONOSCOPY N/A 04/12/2016   Dr. Jena Gauss: 10 mm polyp, two 4 mm polyps at transverse, tubulovillous adenoma, surveillance in 3 years.    COLONOSCOPY N/A 04/12/2016   Dr. Karilyn Cota: post-polypectomy bleed s/p clips at transverse colon    GREEN LIGHT LASER TURP (TRANSURETHRAL RESECTION OF PROSTATE  N/A 02/13/2014   Procedure: GREEN LIGHT LASER TURP (TRANSURETHRAL RESECTION OF PROSTATE;  Surgeon: Jerilee Field, MD;  Location: [facility];  Service: Urology;  Laterality: N/A;   OTHER SURGICAL HISTORY     Intestinal blockage x2   OTHER SURGICAL HISTORY  1964   Football injury   PACEMAKER INSERTION  03/14/13   SJM Endurity implanted by Dr Nedra Hai in [place] for complete heart block     No Known Allergies    Family History  Problem Relation Age of Onset   Cancer Father    Colon cancer Father      Social History Troy Petty reports that he quit smoking about 11 years ago. His smoking use included cigarettes. He started smoking about 56 years ago. He has a 45 pack-year smoking history. He has never used smokeless tobacco. Troy Petty reports no history of alcohol use.   Review of Systems CONSTITUTIONAL: No weight loss, fever, chills, weakness or fatigue.  HEENT: Eyes: No visual loss, blurred vision, double vision or yellow sclerae.No hearing loss, sneezing, congestion, runny nose or sore throat.  SKIN: No rash or itching.  CARDIOVASCULAR:  RESPIRATORY: No shortness of breath, cough or sputum.  GASTROINTESTINAL: No anorexia, nausea, vomiting or diarrhea. No abdominal pain or blood.  GENITOURINARY: No burning on urination, no polyuria NEUROLOGICAL: No headache, dizziness, syncope, paralysis, ataxia, numbness or tingling in the extremities. No change in bowel or bladder control.  MUSCULOSKELETAL: No muscle, back pain, joint pain or stiffness.  LYMPHATICS: No enlarged nodes. No history of splenectomy.  PSYCHIATRIC: No history of depression or anxiety.  ENDOCRINOLOGIC: No reports of sweating, cold or heat intolerance. No polyuria or polydipsia.  Marland Kitchen   Physical Examination There were no vitals filed for this visit. There were no vitals filed for this visit.  Gen: resting comfortably, no acute distress HEENT: no scleral icterus, pupils equal round and reactive, no palptable  cervical adenopathy,  CV Resp: Clear to auscultation bilaterally GI: abdomen is soft, non-tender, non-distended, normal bowel sounds, no hepatosplenomegaly MSK: extremities are warm, no edema.  Skin: warm, no rash Neuro:  no focal deficits Psych: appropriate affect   Diagnostic Studies  09/2013 Echo   Study Conclusions  - Left ventricle: The cavity size was normal. Wall thickness was normal. Systolic function was mildly reduced. The estimated ejection fraction was in the range of 45% to 50%. Doppler parameters are consistent with abnormal left ventricular relaxation (grade 1 diastolic dysfunction). - Regional wall motion  abnormality: Several of the views are foreshortened, however overall the apex does appear hypokinetic. Hypokinesis of the apical anterior, apical inferior, apical septal, apical lateral, and apical myocardium. Can consider echo with contrast for more definitive evaluation of wall motion. - Aortic valve: Mildly calcified annulus. Trileaflet; mildly thickened leaflets. Valve area (VTI): 3.76 cm^2. Valve area (Vmax): 3.65 cm^2. - Mitral valve: Mildly calcified annulus. Mildly thickened leaflets . 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 - Systemic veins: The IVC is small, suggesting low RA pressures and hypovolemia.    10/2012 Cath [facility]   RHC: RA 5, PA 40/12 (mean not reported) PCWP 12, PA sat 75%.   LHC: LM patent, LAD mid 20%, LCX patent, RCA anomolous origine from left cusp patent. LVEF 70%.     10/2014 echo Study Conclusions   - Left ventricle: The cavity size was normal. Wall thickness was   increased in a pattern of mild LVH. Systolic function was normal.   The estimated ejection fraction was in the range of 60% to 65%.   Wall motion was normal; there were no regional wall motion   abnormalities. Doppler parameters are consistent with abnormal   left ventricular relaxation (grade 1 diastolic dysfunction). - Aortic valve:  Mildly calcified annulus. Trileaflet; mildly   thickened leaflets. Valve area (VTI): 4.75 cm^2. Valve area   (Vmax): 3.73 cm^2. Valve area (Vmean): 3.54 cm^2. - Mitral valve: Mildly calcified annulus. Mildly thickened leaflets   . - Left atrium: The atrium was mildly dilated. - Atrial septum: No defect or patent foramen ovale was identified. - Pulmonary arteries: Systolic pressure was moderately increased.   PA peak pressure: 44 mm Hg (S). - Technically adequate study.   Jan 2023 echo IMPRESSIONS     1. Left ventricular ejection fraction, by estimation, is 45%. The left  ventricle has mildly decreased function. Left ventricular endocardial  border not optimally defined to evaluate regional wall motion. Left  ventricular diastolic parameters are  consistent with Grade I diastolic dysfunction (impaired relaxation).   2. Right ventricular systolic function is normal. The right ventricular  size is normal. There is normal pulmonary artery systolic pressure.   3. The mitral valve is normal in structure. No evidence of mitral valve  regurgitation. No evidence of mitral stenosis.   4. The tricuspid valve is abnormal.   5. The aortic valve has an indeterminant number of cusps. There is mild  calcification of the aortic valve. There is mild thickening of the aortic  valve. Aortic valve regurgitation is not visualized. No aortic stenosis is  present.   6. The inferior vena cava is normal in size with greater than 50%  respiratory variability, suggesting right atrial pressure of 3 mmHg.      03/2022 echo IMPRESSIONS     1. Left ventricular ejection fraction, by estimation, is 45%. The left  ventricle has mildly decreased function. The left ventricle demonstrates  regional wall motion abnormalities (see scoring diagram/findings for  description). Left ventricular diastolic   parameters are consistent with Grade I diastolic dysfunction (impaired  relaxation).   2. Right ventricular  systolic function is normal. The right ventricular  size is normal.   3. The mitral valve is normal in structure. Trivial mitral valve  regurgitation. No evidence of mitral stenosis.   4. The aortic valve is tricuspid. Aortic valve regurgitation is not  visualized. No aortic stenosis is present.   5. The inferior vena cava is normal in size with greater than 50%  respiratory variability, suggesting right atrial pressure of 3 mmHg.       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 I worry about his CKD and changing to entresto - continue current meds at this time, request labs from pcp. Perhaps pending renal function over time consider entresto, titration of aldactone - euvolemic today, no significant symptoms   2. HTN - at goal, continue current meds   3. Complete heart block - plans for BiV upgrade per Dr Nelly Laurence     Antoine Poche, M.D., F.A.C.C.

## 2023-01-05 ENCOUNTER — Telehealth: Payer: Self-pay | Admitting: Radiation Oncology

## 2023-01-05 NOTE — Telephone Encounter (Signed)
Left message for patient to call back to schedule consult per 9/4 referral.

## 2023-01-15 NOTE — Progress Notes (Signed)
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 {:18581} Pacemaker/ICD? {:18581} Possible current pregnancy? Male Is the patient on methotrexate? No  Current Complaints / other details:

## 2023-01-16 ENCOUNTER — Encounter: Payer: Self-pay | Admitting: Radiation Oncology

## 2023-01-16 ENCOUNTER — Other Ambulatory Visit: Payer: Self-pay

## 2023-01-16 ENCOUNTER — Ambulatory Visit
Admission: RE | Admit: 2023-01-16 | Discharge: 2023-01-16 | Disposition: A | Discharge status: Home / Self Care | Payer: Medicare Other | Source: Ambulatory Visit | Attending: Radiation Oncology | Admitting: Radiation Oncology

## 2023-01-16 VITALS — Ht 71.0 in | Wt 185.0 lb

## 2023-01-16 DIAGNOSIS — C61 Malignant neoplasm of prostate: Secondary | ICD-10-CM | POA: Insufficient documentation

## 2023-01-16 HISTORY — DX: Elevated prostate specific antigen (PSA): R97.20

## 2023-01-16 NOTE — Progress Notes (Signed)
Radiation Oncology         [phone]) [phone] ________________________________  Initial Outpatient Consultation - Conducted via Telephone due to current COVID-19 concerns for limiting patient exposure  Name: Troy Petty MRN: [id number]  Date: 01/16/2023  DOB: 1950-07-30  CC:Joaquin Courts, DO  Jerilee Field, MD   REFERRING PHYSICIAN: Jerilee Field, MD  DIAGNOSIS: 72 y.o. gentleman with Stage T1c adenocarcinoma of the prostate with Gleason score of 4+3, and PSA of 7.82.    ICD-10-CM   1. Malignant neoplasm of prostate (HCC)  C61       HISTORY OF PRESENT ILLNESS: Troy Petty is a 72 y.o. male with a diagnosis of prostate cancer. He has been followed by [facility] since at least 2015 for a history of urinary retention, BPH with LUTS, and elevated PSA. He is s/p green light laser vaporization of prostate in 01/2014 under the care of  Dr. Mena Goes. He had an elevated PSA prior to this procedure, and it subsequently decreased following treatment. His PSA did rise again in 2019 and has been gradually rising since that time. He initially underwent prostate biopsy in 12/2020 and was found to have low volume Gleason 3+3 prostatic adenocarcinoma. PSA at that time was 7.29. After discussion with Dr. Mena Goes, he opted to proceed with active surveillance. His PSA decreased to 5.74 in 10/2021 but again increased to 7.34 in 06/2022. This remained elevated at 7.82 when repeated in 09/2022.  He was unable to undergo prostate MRI due to having a pacemaker so they agreed to proceed with surveillance prostate biopsy on 12/15/22. The prostate volume measured 36.39 cc.  Out of 12 core biopsies, 3 were positive.  The maximum Gleason score was 4+3, and this was seen in the  right base lateral and right mid lateral. Additionally, a small focus of Gleason 3+3 was seen in the left apex lateral.  The patient reviewed the biopsy results with his urologist and he has kindly been referred today for  discussion of potential radiation treatment options.   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 Wyline Mood WITH [facility] IS PT'S CARDIOLOGIST   Elevated PSA    GERD (gastroesophageal reflux disease)    Hemorrhoids    Hypertension    Hypertrophy of prostate with urinary obstruction and other lower urinary tract symptoms (LUTS)    INDWELLING FOLEY CATHETER   Metabolic syndrome    Mild pulmonary hypertension (HCC)    Pacemaker    IMPLANTED Mar 14, 2013 - DR. ALLRED WITH [facility] FOLLOWS DEVICE FUNCTIONING   Pain    BACK PAIN AT TIMES   Shortness of breath    WITH EXERTION   Vitamin B12 deficiency       PAST SURGICAL HISTORY: Past Surgical History:  Procedure Laterality Date   BIV UPGRADE N/A 08/21/2022   Procedure: BIV PPM UPGRADE;  Surgeon: Mealor, Roberts Gaudy, MD;  Location: [facility];  Service: Cardiovascular;  Laterality: N/A;   CARDIAC  CATHETERIZATION  2014   COLON SURGERY     COLON RESECTION FOR BLOCKAGE   COLONOSCOPY N/A 04/12/2016   Dr. Jena Gauss: 10 mm polyp, two 4 mm polyps at transverse, tubulovillous adenoma, surveillance in 3 years.    COLONOSCOPY N/A 04/12/2016   Dr. Karilyn Cota: post-polypectomy bleed s/p clips at transverse colon    GREEN LIGHT LASER TURP (TRANSURETHRAL RESECTION OF PROSTATE N/A 02/13/2014   Procedure: GREEN LIGHT LASER TURP (TRANSURETHRAL RESECTION OF PROSTATE;  Surgeon: Jerilee Field, MD;  Location: [facility];  Service: Urology;  Laterality: N/A;   OTHER SURGICAL HISTORY      Intestinal blockage x2   OTHER SURGICAL HISTORY  1964   Football injury   PACEMAKER INSERTION  03/14/2013   SJM Endurity implanted by Dr Nedra Hai in [place] for complete heart block   PROSTATE BIOPSY      FAMILY HISTORY:  Family History  Problem Relation Age of Onset   Cancer Father    Colon cancer Father     SOCIAL HISTORY:  Social History   Socioeconomic History   Marital status: Married    Spouse name: Not on file   Number of children: Not on file   Years of education: Not on file   Highest education level: Not on file  Occupational History   Not on file  Tobacco Use   Smoking status: Former    Current packs/day: 0.00    Average packs/day: 1 pack/day for 45.0 years (45.0 ttl pk-yrs)    Types: Cigarettes    Start date: 09/29/1966    Quit date: 05/02/2011    Years since quitting: 11.7   Smokeless tobacco: Never  Vaping Use   Vaping status: Never Used  Substance and Sexual Activity   Alcohol use: Yes    Comment: occ   Drug use: No   Sexual activity: Not on file  Other Topics Concern   Not on file  Social History Narrative   Lives in [place] with spouse.   Social Determinants of Health   Financial Resource Strain: Not on file  Food Insecurity: No Food Insecurity (01/16/2023)   Hunger Vital Sign    Worried About Running Out of Food in the Last Year: Never true    Ran Out of Food in the Last Year: Never true  Transportation Needs: No Transportation Needs (01/16/2023)   PRAPARE - Administrator, Civil Service (Medical): No    Lack of Transportation (Non-Medical): No  Physical Activity: Not on file  Stress: Not on file  Social Connections: Not on file  Intimate Partner Violence: Not At Risk (01/16/2023)   Humiliation, Afraid, Rape, and Kick questionnaire    Fear of Current or Ex-Partner: No    Emotionally Abused: No    Physically Abused: No    Sexually Abused: No    ALLERGIES: Patient has no known allergies.  MEDICATIONS:  Current  Outpatient Medications  Medication Sig Dispense Refill   glucose blood test strip use as directed to test once daily Dx E11.65     acetaminophen (TYLENOL) 500 MG tablet Take 500 mg by mouth every 6 (six) hours as needed for headache or moderate pain.     albuterol (VENTOLIN HFA) 108 (90 Base) MCG/ACT inhaler Inhale 2 puffs into the lungs every 6 (six) hours as needed for wheezing or shortness of breath.     amLODipine (NORVASC) 10 MG tablet TAKE 1 TABLET BY MOUTH EVERY DAY 90 tablet 1   aspirin 81 MG EC tablet Take  81 mg by mouth daily.     Blood Glucose Monitoring Suppl (ONE TOUCH ULTRA 2) w/Device KIT USE AS DIRECTED TO TEST GLUCOSE ONCE DAILY DX E11.65     carvedilol (COREG) 3.125 MG tablet TAKE 1 TABLET BY MOUTH 2 TIMES DAILY. *STOP METOPROLOL* 180 tablet 2   JARDIANCE 25 MG TABS tablet Take 25 mg by mouth daily.     losartan (COZAAR) 50 MG tablet Take 50 mg by mouth daily.     Magnesium 200 MG TABS Take 400 mg by mouth daily.     omeprazole (PRILOSEC) 40 MG capsule Take 1 capsule by mouth daily as needed.      OneTouch Delica Lancets 30G MISC USE AS DIRECTED TO TEST GLUCOSE ONCE DAILY DX E11.65     spironolactone (ALDACTONE) 25 MG tablet Take 0.5 tablets (12.5 mg total) by mouth daily. 45 tablet 3   Vitamin D, Ergocalciferol, (DRISDOL) 1.25 MG (50000 UNIT) CAPS capsule Take 50,000 Units by mouth every Wednesday.     No current facility-administered medications for this encounter.    REVIEW OF SYSTEMS:  On review of systems, the patient reports that he is doing well overall. He denies any chest pain, shortness of breath, cough, fevers, chills, night sweats, unintended weight changes. He denies any bowel disturbances, and denies abdominal pain, nausea or vomiting. He denies any new musculoskeletal or joint aches or pains. His IPSS was 6, indicating mild urinary symptoms. His SHIM was 14, indicating he has mild-moderate erectile dysfunction. A complete review of systems is obtained and is  otherwise negative.    PHYSICAL EXAM:  Wt Readings from Last 3 Encounters:  01/16/23 185 lb (83.9 kg)  12/01/22 192 lb (87.1 kg)  08/21/22 185 lb (83.9 kg)   Temp Readings from Last 3 Encounters:  08/21/22 97.9 F (36.6 C) (Oral)  12/13/18 97.7 F (36.5 C)  04/21/16 98 F (36.7 C) (Oral)   BP Readings from Last 3 Encounters:  12/01/22 118/70  08/21/22 (!) 158/87  06/19/22 135/82   Pulse Readings from Last 3 Encounters:  12/01/22 69  08/21/22 74  06/19/22 77   Pain Assessment Pain Score: 0-No pain/10  Physical exam not performed in light of telephone consult visit format.   KPS = 100  100 - Normal; no complaints; no evidence of disease. 90   - Able to carry on normal activity; minor signs or symptoms of disease. 80   - Normal activity with effort; some signs or symptoms of disease. 25   - Cares for self; unable to carry on normal activity or to do active work. 60   - Requires occasional assistance, but is able to care for most of his personal needs. 50   - Requires considerable assistance and frequent medical care. 40   - Disabled; requires special care and assistance. 30   - Severely disabled; hospital admission is indicated although death not imminent. 20   - Very sick; hospital admission necessary; active supportive treatment necessary. 10   - Moribund; fatal processes progressing rapidly. 0     - Dead  Karnofsky DA, Abelmann WH, Craver LS and Burchenal [facility] [phone]) The use of the nitrogen mustards in the palliative treatment of carcinoma: with particular reference to bronchogenic carcinoma Cancer 1 634-56  LABORATORY DATA:  Lab Results  Component Value Date   WBC 8.0 08/04/2022   HGB 14.7 08/04/2022   HCT 48.0 08/04/2022   MCV 86 08/04/2022   PLT 362 04/21/2016   Lab Results  Component  Value Date   NA 140 08/04/2022   K 4.4 08/04/2022   CL 104 08/04/2022   CO2 19 (L) 08/04/2022   Lab Results  Component Value Date   ALT 16 (L) 04/13/2016   AST 15  04/13/2016   ALKPHOS 36 (L) 04/13/2016   BILITOT 0.8 04/13/2016     RADIOGRAPHY: No results found.    IMPRESSION/PLAN: This visit was conducted via Telephone to spare the patient unnecessary potential exposure in the healthcare setting during the current COVID-19 pandemic. 1. 72 y.o. gentleman with Stage T1c adenocarcinoma of the prostate with Gleason Score of 4+3, and PSA of 7.82. We discussed the patient's workup and outlined the nature of prostate cancer in this setting. The patient's T stage, Gleason's score, and PSA put him into the unfavorable intermediate risk group. Accordingly, he is eligible for a variety of potential treatment options including brachytherapy, 5.5 weeks of external radiation, or prostatectomy. We discussed the available radiation techniques, and focused on the details and logistics and delivery.  He may not be a chemotherapy in light of his previous greenlight laser prostate vaporization procedure in 2015.  We discussed and outlined the risks, benefits, short and long-term effects associated with radiotherapy and compared and contrasted these with prostatectomy.  He was informed that there is an excellent radiation oncology team at [facility] in [place] should he ultimately decide to proceed with daily external beam radiation and want to have the treatments closer to home.  We discussed the role of SpaceOAR in reducing the rectal toxicity associated with radiotherapy. He appears to have a good understanding of his disease and our treatment recommendations which are of curative intent.  He was encouraged to ask questions that were answered to his stated satisfaction.  At the end of the conversation the patient is interested in moving forward with brachytherapy and use of SpaceOAR gel to reduce rectal toxicity from radiotherapy.  We will share our discussion with Dr. Mena Goes and move forward with scheduling his CT [facility] planning appointment in the near future.  We will need  to verify that his anatomy, post laser vaporization, is suitable for the brachytherapy procedure which will require placement of a Foley catheter at the time of his pre-seed CT SIM.  If it appears that he is not a good candidate for brachytherapy, he reports that he would proceed with the daily external beam radiation here in [place].  The patient will be contacted by Darryl Nestle in our office who will be working closely with him to coordinate OR scheduling and pre and post procedure appointments.  We will contact the pharmaceutical rep to ensure that SpaceOAR is available at the time of procedure.  We enjoyed meeting him today and look forward to continuing to participate in his care.  Given current concerns for patient exposure during the COVID-19 pandemic, this encounter was conducted via telephone. The patient was notified in advance and was offered a MyChart meeting to allow for face to face communication but unfortunately reported that he did not have the appropriate resources/technology to support such a visit and instead preferred to proceed with telephone consult. The patient has given verbal consent for this type of encounter. The attendants for this meeting include Margaretmary Dys MD, Ashlyn Bruning PA-C, and patient Troy Petty and his wife, Troy Petty. During the encounter, Margaretmary Dys MD and Marcello Fennel PA-C were located at [facility] Radiation Oncology Department.  Patient Troy Petty and his wife were located at home.  We personally spent 70 minutes in this encounter including chart review, reviewing radiological studies, meeting face-to-face with the patient, entering orders and completing documentation.    Marguarite Arbour, PA-C    Margaretmary Dys, MD  [facility]  Radiation Oncology Direct Dial: [phone]  Fax: [phone] [email or web address]  Skype  [email or web address]   This document serves as a record of services personally performed by Margaretmary Dys, MD and Marcello Fennel, PA-C. It was created on their behalf by Mickie Bail, a trained medical scribe. The creation of this record is based on the scribe's personal observations and the provider's statements to them. This document has been checked and approved by the attending provider.

## 2023-01-17 ENCOUNTER — Encounter: Payer: Self-pay | Admitting: Cardiovascular Disease

## 2023-01-17 NOTE — Progress Notes (Signed)
TO BE COMPLETED BY RADIATION ONCOLOGIST OFFICE:   Patient Name: Troy Petty   Date of Birth: 1951/02/17   Radiation Oncologist: Dr. Margaretmary Dys   Site to be Treated: Prostate  Will x-rays >10 MV be used? No  Will the radiation be >10 cm from the device? Yes  Planned Treatment Start Date: 1-2 weeks  TO BE COMPLETED BY CARDIOLOGIST OFFICE:   Device Information:  Pacemaker [x]      ICD []    Brand: [employer]: [phone] Model #: Satira Sark [employer] [address] MP  Serial Number: [id number]     Date of Placement: Chest  Site of Placement: 08/21/2022  Remote Device Check--Frequency: Every 91 days   Last Check: 12/01/2022  Is the Patient Pacer Dependent?:  Yes [x]   No []   Does cardiologist request Radiation Oncology to schedule device testing by vendor for the following:  Prior to the Initiation of Treatments?  Yes []  No [x]  During Treatments?  Yes []  No [x]  Post Radiation Treatments?  Yes []  No [x]   Is device monitoring necessary by vendor/cardiologist team during treatments?  Yes []   No [x]   Is cardiac monitoring by Radiation Oncology nursing necessary during treatments? Yes []   No [x]   Do you recommend device be relocated prior to Radiation Treatment? Yes []   No [x]   **PLEASE LIST ANY NOTES OR SPECIAL REQUESTS:       CARDIOLOGIST SIGNATURE:  Dr. York Pellant Per Device Clinic Standing Orders, Lenor Coffin  01/17/2023 11:39 AM  **Please route completed form back to Radiation Oncology Nursing and "P [facility] RAD ONC ADMIN", OR send an update if there will be a delay in having form completed by expected start date.  **Call [phone] if you have any questions or do not get an in-basket response from a Radiation Oncology staff member

## 2023-01-18 ENCOUNTER — Other Ambulatory Visit: Payer: Self-pay | Admitting: Urology

## 2023-01-19 ENCOUNTER — Telehealth: Payer: Self-pay | Admitting: *Deleted

## 2023-01-19 NOTE — Telephone Encounter (Signed)
Called patient to inform of pre-seed appts. and implant date, spoke with patient and he is aware of these appts.

## 2023-01-22 NOTE — Progress Notes (Signed)
Spoke with patient via telephone to introduce myself as the prostate nurse navigator and discussed my role.  No barriers to care identified at this time.  Patient is scheduled for his CT Simulation on 03/01/2023.  RN will mail education on brachytherapy along with my contact information.  No additional needs at this time.

## 2023-02-01 ENCOUNTER — Other Ambulatory Visit: Payer: Self-pay | Admitting: Cardiology

## 2023-02-22 ENCOUNTER — Ambulatory Visit (INDEPENDENT_AMBULATORY_CARE_PROVIDER_SITE_OTHER): Payer: Medicare Other

## 2023-02-22 DIAGNOSIS — I442 Atrioventricular block, complete: Secondary | ICD-10-CM | POA: Diagnosis not present

## 2023-02-22 LAB — CUP PACEART REMOTE DEVICE CHECK
Battery Remaining Longevity: 68 mo
Battery Remaining Percentage: 92 %
Battery Voltage: 2.98 V
Brady Statistic AP VP Percent: 9.2 %
Brady Statistic AP VS Percent: 1 %
Brady Statistic AS VP Percent: 91 %
Brady Statistic AS VS Percent: 1 %
Brady Statistic RA Percent Paced: 8.4 %
Date Time Interrogation Session: 20241024020010
Implantable Lead Connection Status: 753985
Implantable Lead Connection Status: 753985
Implantable Lead Connection Status: 753985
Implantable Lead Implant Date: 20141114
Implantable Lead Implant Date: 20141114
Implantable Lead Implant Date: 20240422
Implantable Lead Location: 753858
Implantable Lead Location: 753859
Implantable Lead Location: 753860
Implantable Pulse Generator Implant Date: 20240422
Lead Channel Impedance Value: 380 Ohm
Lead Channel Impedance Value: 560 Ohm
Lead Channel Impedance Value: 990 Ohm
Lead Channel Pacing Threshold Amplitude: 0.5 V
Lead Channel Pacing Threshold Amplitude: 0.5 V
Lead Channel Pacing Threshold Amplitude: 1 V
Lead Channel Pacing Threshold Pulse Width: 0.5 ms
Lead Channel Pacing Threshold Pulse Width: 0.5 ms
Lead Channel Pacing Threshold Pulse Width: 0.5 ms
Lead Channel Sensing Intrinsic Amplitude: 12 mV
Lead Channel Sensing Intrinsic Amplitude: 4.5 mV
Lead Channel Setting Pacing Amplitude: 2.5 V
Lead Channel Setting Pacing Amplitude: 2.5 V
Lead Channel Setting Pacing Amplitude: 3.5 V
Lead Channel Setting Pacing Pulse Width: 0.5 ms
Lead Channel Setting Pacing Pulse Width: 0.5 ms
Lead Channel Setting Sensing Sensitivity: 4 mV
Pulse Gen Model: 3562
Pulse Gen Serial Number: 8126985

## 2023-02-27 ENCOUNTER — Telehealth: Payer: Self-pay | Admitting: *Deleted

## 2023-02-27 NOTE — Telephone Encounter (Signed)
Called patient to remind of pre-seed appts. for 03-01-23, spoke with patient and he is aware of these appts.

## 2023-02-28 ENCOUNTER — Encounter: Payer: Self-pay | Admitting: Cardiology

## 2023-02-28 ENCOUNTER — Ambulatory Visit: Payer: Medicare Other | Attending: Cardiology | Admitting: Cardiology

## 2023-02-28 VITALS — BP 128/76 | HR 73 | Ht 71.0 in | Wt 192.0 lb

## 2023-02-28 DIAGNOSIS — I10 Essential (primary) hypertension: Secondary | ICD-10-CM | POA: Diagnosis present

## 2023-02-28 DIAGNOSIS — I442 Atrioventricular block, complete: Secondary | ICD-10-CM | POA: Diagnosis present

## 2023-02-28 DIAGNOSIS — I5022 Chronic systolic (congestive) heart failure: Secondary | ICD-10-CM | POA: Insufficient documentation

## 2023-02-28 NOTE — Patient Instructions (Addendum)

## 2023-02-28 NOTE — Progress Notes (Signed)
Radiation Oncology         [phone] ________________________________  Outpatient Follow up- Pre-seed visit  Name: Troy Petty MRN: [id number]  Date: 03/01/2023  DOB: [birth year]  CC:Troy Courts, DO  Troy Field, MD   REFERRING PHYSICIAN: Jerilee Field, MD  DIAGNOSIS: 73 y.o. gentleman with Stage T1c adenocarcinoma of the prostate with Gleason score of 4+3, and PSA of 7.82.     ICD-10-CM   1. Malignant neoplasm of prostate (HCC)  C61       HISTORY OF PRESENT ILLNESS: Troy Petty is a 72 y.o. male with a diagnosis of prostate cancer. He has been followed by [facility] since at least 2015 for a history of urinary retention, BPH with LUTS, and elevated PSA. He is s/p green light laser vaporization of prostate in 01/2014 under the care of  Dr. Mena Goes. He had an elevated PSA prior to this procedure, and it subsequently decreased following treatment. His PSA did rise again in 2019 and has been gradually rising since that time. He initially underwent prostate biopsy in 12/2020 and was found to have low volume Gleason 3+3 prostatic adenocarcinoma. PSA at that time was 7.29. After discussion with Dr. Mena Goes, he opted to proceed with active surveillance. His PSA decreased to 5.74 in 10/2021 but again increased to 7.34 in 06/2022. This remained elevated at 7.82 when repeated in 09/2022.  He was unable to undergo prostate MRI due to having a pacemaker so they agreed to proceed with surveillance prostate biopsy on 12/15/22. The prostate volume measured 36.39 cc.  Out of 12 core biopsies, 3 were positive.  The maximum Gleason score was 4+3, and this was seen in the  right base lateral and right mid lateral. Additionally, a small focus of Gleason 3+3 was seen in the left apex lateral.   The patient reviewed the biopsy results with his urologist and was kindly referred to [place] for discussion of potential radiation treatment options. We initially met the patient on 01/16/23  and he was most interested in proceeding with brachytherapy and SpaceOAR gel placement for treatment of his disease. He is here today for his pre-procedure imaging with foley catheter placement to verify anatomy for planning and to answer any additional questions he may have about this treatment.   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 Wyline Mood WITH [facility] IS PT'S CARDIOLOGIST   Elevated PSA    GERD (gastroesophageal reflux disease)    Hemorrhoids    Hypertension    Hypertrophy of prostate with urinary obstruction and other lower urinary tract symptoms (LUTS)    INDWELLING FOLEY CATHETER   Metabolic syndrome    Mild pulmonary hypertension (HCC)    Pacemaker    IMPLANTED Mar 14, 2013 - DR. ALLRED WITH [facility] FOLLOWS DEVICE FUNCTIONING   Pain    BACK PAIN AT TIMES   Shortness of breath    WITH EXERTION   Vitamin B12 deficiency  PAST SURGICAL HISTORY: Past Surgical History:  Procedure Laterality Date   BIV UPGRADE N/A 08/21/2022   Procedure: BIV PPM UPGRADE;  Surgeon: Mealor, Roberts Gaudy, MD;  Location: [facility];  Service: Cardiovascular;  Laterality: N/A;   CARDIAC CATHETERIZATION  2014   COLON SURGERY     COLON RESECTION FOR BLOCKAGE   COLONOSCOPY N/A 04/12/2016   Dr. Jena Gauss: 10 mm polyp, two 4 mm polyps at transverse, tubulovillous adenoma, surveillance in 3 years.    COLONOSCOPY N/A 04/12/2016   Dr. Karilyn Cota: post-polypectomy bleed s/p clips at transverse colon    GREEN LIGHT LASER  TURP (TRANSURETHRAL RESECTION OF PROSTATE N/A 02/13/2014   Procedure: GREEN LIGHT LASER TURP (TRANSURETHRAL RESECTION OF PROSTATE;  Surgeon: Troy Field, MD;  Location: [facility];  Service: Urology;  Laterality: N/A;   OTHER SURGICAL HISTORY     Intestinal blockage x2   OTHER SURGICAL HISTORY  1964   Football injury   PACEMAKER INSERTION  03/14/2013   SJM Endurity implanted by Dr Nedra Hai in [place] for complete heart block   PROSTATE BIOPSY      FAMILY HISTORY:  Family History  Problem Relation Age of Onset   Cancer Father    Colon cancer Father     SOCIAL HISTORY:  Social History   Socioeconomic History   Marital status: Married    Spouse name: Not on file   Number of children: Not on file   Years of education: Not on file   Highest education level: Not on file  Occupational History   Not on file  Tobacco Use   Smoking status: Former    Current packs/day: 0.00    Average packs/day: 1 pack/day for 45.0 years (45.0 ttl pk-yrs)    Types: Cigarettes    Start date: 09/29/1966    Quit date: 05/02/2011    Years since quitting: 11.8   Smokeless tobacco: Never  Vaping Use   Vaping status: Never Used  Substance and Sexual Activity   Alcohol use: Yes    Comment: occ   Drug use: No   Sexual activity: Not on file  Other Topics Concern   Not on file  Social History Narrative   Lives in [place] with spouse.   Social Determinants of Health   Financial Resource Strain: Not on file  Food Insecurity: No Food Insecurity (01/16/2023)   Hunger Vital Sign    Worried About Running Out of Food in the Last Year: Never true    Ran Out of Food in the Last Year: Never true  Transportation Needs: No Transportation Needs (01/16/2023)   PRAPARE - Administrator, Civil Service (Medical): No    Lack of Transportation (Non-Medical): No  Physical Activity: Not on file  Stress: Not on file  Social Connections: Not on file  Intimate Partner Violence: Not At Risk  (01/16/2023)   Humiliation, Afraid, Rape, and Kick questionnaire    Fear of Current or Ex-Partner: No    Emotionally Abused: No    Physically Abused: No    Sexually Abused: No    ALLERGIES: Patient has no known allergies.  MEDICATIONS:  Current Outpatient Medications  Medication Sig Dispense Refill   acetaminophen (TYLENOL) 500 MG tablet Take 500 mg by mouth every 6 (six) hours as needed for headache or moderate pain.     albuterol (VENTOLIN HFA) 108 (90 Base) MCG/ACT inhaler Inhale 2 puffs into the lungs every 6 (six) hours as needed for wheezing or shortness of breath.  amLODipine (NORVASC) 10 MG tablet TAKE 1 TABLET BY MOUTH EVERY DAY 90 tablet 1   aspirin 81 MG EC tablet Take 81 mg by mouth daily.     Blood Glucose Monitoring Suppl (ONE TOUCH ULTRA 2) w/Device KIT USE AS DIRECTED TO TEST GLUCOSE ONCE DAILY DX E11.65     carvedilol (COREG) 3.125 MG tablet TAKE 1 TABLET BY MOUTH 2 TIMES DAILY. *STOP METOPROLOL* 180 tablet 2   glucose blood test strip use as directed to test once daily Dx E11.65     JARDIANCE 25 MG TABS tablet Take 25 mg by mouth daily.     losartan (COZAAR) 50 MG tablet Take 50 mg by mouth daily.     Magnesium 200 MG TABS Take 400 mg by mouth daily.     omeprazole (PRILOSEC) 40 MG capsule Take 1 capsule by mouth daily as needed.      OneTouch Delica Lancets 30G MISC USE AS DIRECTED TO TEST GLUCOSE ONCE DAILY DX E11.65     spironolactone (ALDACTONE) 25 MG tablet Take 0.5 tablets (12.5 mg total) by mouth daily. 45 tablet 3   Vitamin D, Ergocalciferol, (DRISDOL) 1.25 MG (50000 UNIT) CAPS capsule Take 50,000 Units by mouth every Wednesday.     No current facility-administered medications for this visit.    REVIEW OF SYSTEMS:   On review of systems, the patient reports that he is doing well overall. He denies any chest pain, shortness of breath, cough, fevers, chills, night sweats, unintended weight changes. He denies any bowel disturbances, and denies abdominal pain,  nausea or vomiting. He denies any new musculoskeletal or joint aches or pains. His IPSS was 6, indicating mild urinary symptoms. His SHIM was 14, indicating he has mild-moderate erectile dysfunction. A complete review of systems is obtained and is otherwise negative.     PHYSICAL EXAM:  Wt Readings from Last 3 Encounters:  02/28/23 192 lb (87.1 kg)  01/16/23 185 lb (83.9 kg)  12/01/22 192 lb (87.1 kg)   Temp Readings from Last 3 Encounters:  08/21/22 97.9 F (36.6 C) (Oral)  12/13/18 97.7 F (36.5 C)  04/21/16 98 F (36.7 C) (Oral)   BP Readings from Last 3 Encounters:  02/28/23 128/76  12/01/22 118/70  08/21/22 (!) 158/87   Pulse Readings from Last 3 Encounters:  02/28/23 73  12/01/22 69  08/21/22 74    /10  In general this is a well appearing African American male in no acute distress. He's alert and oriented x4 and appropriate throughout the examination. Cardiopulmonary assessment is negative for acute distress, and he exhibits normal effort.     KPS = 100  100 - Normal; no complaints; no evidence of disease. 90   - Able to carry on normal activity; minor signs or symptoms of disease. 80   - Normal activity with effort; some signs or symptoms of disease. 20   - Cares for self; unable to carry on normal activity or to do active work. 60   - Requires occasional assistance, but is able to care for most of his personal needs. 50   - Requires considerable assistance and frequent medical care. 40   - Disabled; requires special care and assistance. 30   - Severely disabled; hospital admission is indicated although death not imminent. 20   - Very sick; hospital admission necessary; active supportive treatment necessary. 10   - Moribund; fatal processes progressing rapidly. 0     - Dead  Karnofsky DA, Abelmann WH, Craver LS and  Burchenal JH [phone]) The use of the nitrogen mustards in the palliative treatment of carcinoma: with particular reference to bronchogenic carcinoma  Cancer 1 [phone]  LABORATORY DATA:  Lab Results  Component Value Date   WBC 8.0 08/04/2022   HGB 14.7 08/04/2022   HCT 48.0 08/04/2022   MCV 86 08/04/2022   PLT 362 04/21/2016   Lab Results  Component Value Date   NA 140 08/04/2022   K 4.4 08/04/2022   CL 104 08/04/2022   CO2 19 (L) 08/04/2022   Lab Results  Component Value Date   ALT 16 (L) 04/13/2016   AST 15 04/13/2016   ALKPHOS 36 (L) 04/13/2016   BILITOT 0.8 04/13/2016     RADIOGRAPHY: CUP PACEART REMOTE DEVICE CHECK  Result Date: 02/22/2023 Scheduled remote reviewed. Normal device function.  7 AMS, 2-8sec in duration, 2 show atrial lead noise - known, other PAT Next remote 91 days. LA, CVRS     IMPRESSION/PLAN: 1. 72 y.o. gentleman with Stage T1c adenocarcinoma of the prostate with Gleason score of 4+3, and PSA of 7.82.  The patient has elected to proceed with seed implant for treatment of his disease. We reviewed the risks, benefits, short and long-term effects associated with brachytherapy and discussed the role of SpaceOAR in reducing the rectal toxicity associated with radiotherapy.  He appears to have a good understanding of his disease and our treatment recommendations which are of curative intent.  He was encouraged to ask questions that were answered to his stated satisfaction. He has freely signed written consent to proceed today in the office and a copy of this document will be placed in his medical record. His procedure is tentatively scheduled for 03/20/23 in collaboration with Dr. Mena Goes and we will see him back for his post-procedure visit approximately 3 weeks thereafter. We look forward to continuing to participate in his care. He knows that he is welcome to call with any questions or concerns at any time in the interim.  I personally spent 30 minutes in this encounter including chart review, reviewing radiological studies, meeting face-to-face with the patient, entering orders and completing  documentation.    Marguarite Arbour, MMS, PA-C [place]  [facility] Radiation Oncology Physician Assistant Direct Dial: [phone]  Fax: [phone]

## 2023-02-28 NOTE — Progress Notes (Signed)
Radiation Oncology         [phone]) [phone] ________________________________  Name: Troy Petty MRN: [id number]  Date: 03/01/2023  DOB: 07-03-1950  SIMULATION AND TREATMENT PLANNING NOTE PUBIC ARCH STUDY  NF:AOZHYQ, [name], DO  Jerilee Field, MD  DIAGNOSIS:  72 y.o. gentleman with Stage T1c adenocarcinoma of the prostate with Gleason score of 4+3, and PSA of 7.82.   Oncology History  Malignant neoplasm of prostate (HCC)  12/15/2022 Cancer Staging   Staging form: Prostate, AJCC 8th Edition - Clinical stage from 12/15/2022: Stage IIC (cT1c, cN0, cM0, PSA: 7.8, Grade Group: 3) - Signed by Marcello Fennel, PA-C on 01/16/2023 Histopathologic type: Adenocarcinoma, NOS Stage prefix: Initial diagnosis Prostate specific antigen (PSA) range: Less than 10 Gleason primary pattern: 4 Gleason secondary pattern: 3 Gleason score: 7 Histologic grading system: 5 grade system Number of biopsy cores examined: 12 Number of biopsy cores positive: 4 Location of positive needle core biopsies: Both sides   01/16/2023 Initial Diagnosis   Malignant neoplasm of prostate (HCC)       ICD-10-CM   1. Malignant neoplasm of prostate (HCC)  C61       COMPLEX SIMULATION:  The patient presented today for evaluation for possible prostate seed implant. He was brought to the radiation planning suite and placed supine on the CT couch. A 3-dimensional image study set was obtained in upload to the planning computer. There, on each axial slice, I contoured the prostate gland. Then, using three-dimensional radiation planning tools I reconstructed the prostate in view of the structures from the transperineal needle pathway to assess for possible pubic arch interference. In doing so, I did not appreciate any pubic arch interference. Also, the patient's prostate volume was estimated based on the drawn structure. The volume was 36 cc.  Given the pubic arch appearance and prostate volume, patient remains a good  candidate to proceed with prostate seed implant. Today, he freely provided informed written consent to proceed.    PLAN: The patient will undergo prostate seed implant.   ________________________________  Artist Pais. Kathrynn Running, M.D.

## 2023-02-28 NOTE — Progress Notes (Signed)
Clinical Summary Troy Petty is a 72 y.o.male seen today for follow up of the following medical problems.    1. History of cardiomyopathy/ Low normal to mildly decreased LV systolic function   - echo 09/2013 LVEF 45-50%, grade I diastolic dysfunction. Apex appears to be hypokinetic though poor visualization   - prior cath in [facility] 10/2012 with only mid LAD 20% lesion, otherwise patent vessels.   - repeat echo 10/2014 with LVEF 60-65%, no WMAs. - Toprol decreased during EP visit due to mild SOB. Breathing improved lower dose Toprol.     Dec 2023 echo: LVEF 45%, grade I DD - some SOB/DOE with walking inclines, does ok on flat ground. Some improvement when he was more active.  - compliant with meds    2. Non-obstructive CAD   - mild LAD lesion from cath 2014   - denies any chest pains       3. Complete heart block   - 07/2022 upgraded to BiV pacemaker by Troy Troy Petty 01/2023 device check normal device check         4. HTN   - reports neprhologist  started losartan 50mg  daily - home bp's typically 130s/70s     5. AAA screen - 01/2017 no AAA     6. CKD - followed by [facility] - baseline Cr 1.7  7. COPD - PFTs severe obstruction in 2023 Past Medical History:  Diagnosis Date   Arthritis    MIDDLE FINGER AND RING FINGER BOTH HANDS   Chronic kidney disease, stage III (moderate) (HCC)    Chronic renal insufficiency    baseline creatinine is 1.8   Chronic systolic dysfunction of left ventricle 07/2013   EF 40-45% in setting of PMT on limited echo   Complete heart block (HCC)    COPD (chronic obstructive pulmonary disease) (HCC)    Coronary artery disease    NON OBSTRUCTIVE CAD; Troy Petty WITH [facility] IS PT'S CARDIOLOGIST   Elevated PSA    GERD (gastroesophageal reflux disease)    Hemorrhoids    Hypertension    Hypertrophy of prostate with urinary obstruction and other lower urinary tract symptoms (LUTS)    INDWELLING FOLEY CATHETER    Metabolic syndrome    Mild pulmonary hypertension (HCC)    Pacemaker    IMPLANTED Mar 14, 2013 - Troy Petty WITH [facility] FOLLOWS DEVICE FUNCTIONING   Pain    BACK PAIN AT TIMES   Shortness of breath    WITH EXERTION   Vitamin B12 deficiency      No Known Allergies   Current Outpatient Medications  Medication Sig Dispense Refill   acetaminophen (TYLENOL) 500 MG tablet Take 500 mg by mouth every 6 (six) hours as needed for headache or moderate pain.     albuterol (VENTOLIN HFA) 108 (90 Base) MCG/ACT inhaler Inhale 2 puffs into the lungs every 6 (six) hours as needed for wheezing or shortness of breath.     amLODipine (NORVASC) 10 MG tablet TAKE 1 TABLET BY MOUTH EVERY DAY 90 tablet 1   aspirin 81 MG EC tablet Take 81 mg by mouth daily.     Blood Glucose Monitoring Suppl (ONE TOUCH ULTRA 2) w/Device KIT USE AS DIRECTED TO TEST GLUCOSE ONCE DAILY DX E11.65     carvedilol (COREG) 3.125 MG tablet TAKE 1 TABLET BY MOUTH 2 TIMES DAILY. *STOP METOPROLOL* 180 tablet 2   glucose blood test strip use as directed to test  once daily Dx E11.65     JARDIANCE 25 MG TABS tablet Take 25 mg by mouth daily.     losartan (COZAAR) 50 MG tablet Take 50 mg by mouth daily.     Magnesium 200 MG TABS Take 400 mg by mouth daily.     omeprazole (PRILOSEC) 40 MG capsule Take 1 capsule by mouth daily as needed.      OneTouch Delica Lancets 30G MISC USE AS DIRECTED TO TEST GLUCOSE ONCE DAILY DX E11.65     spironolactone (ALDACTONE) 25 MG tablet Take 0.5 tablets (12.5 mg total) by mouth daily. 45 tablet 3   Vitamin D, Ergocalciferol, (DRISDOL) 1.25 MG (50000 UNIT) CAPS capsule Take 50,000 Units by mouth every Wednesday.     No current facility-administered medications for this visit.     Past Surgical History:  Procedure Laterality Date   BIV UPGRADE N/A 08/21/2022   Procedure: BIV PPM UPGRADE;  Surgeon: Troy Petty;  Location: [facility];  Service: Cardiovascular;  Laterality: N/A;    CARDIAC CATHETERIZATION  2014   COLON SURGERY     COLON RESECTION FOR BLOCKAGE   COLONOSCOPY N/A 04/12/2016   Troy Petty: 10 mm polyp, two 4 mm polyps at transverse, tubulovillous adenoma, surveillance in 3 years.    COLONOSCOPY N/A 04/12/2016   Troy Petty: post-polypectomy bleed s/p clips at transverse colon    GREEN LIGHT LASER TURP (TRANSURETHRAL RESECTION OF PROSTATE N/A 02/13/2014   Procedure: GREEN LIGHT LASER TURP (TRANSURETHRAL RESECTION OF PROSTATE;  Surgeon: Troy Field, Petty;  Location: [facility];  Service: Urology;  Laterality: N/A;   OTHER SURGICAL HISTORY     Intestinal blockage x2   OTHER SURGICAL HISTORY  1964   Football injury   PACEMAKER INSERTION  03/14/2013   SJM Endurity implanted by Troy Petty in [place] for complete heart block   PROSTATE BIOPSY       No Known Allergies    Family History  Problem Relation Age of Onset   Cancer Father    Colon cancer Father      Social History Troy Petty reports that he quit smoking about 11 years ago. His smoking use included cigarettes. He started smoking about 56 years ago. He has a 45 pack-year smoking history. He has never used smokeless tobacco. Troy Petty reports current alcohol use.   Review of Systems CONSTITUTIONAL: No weight loss, fever, chills, weakness or fatigue.  HEENT: Eyes: No visual loss, blurred vision, double vision or yellow sclerae.No hearing loss, sneezing, congestion, runny nose or sore throat.  SKIN: No rash or itching.  CARDIOVASCULAR: per hpi RESPIRATORY:per hpi GASTROINTESTINAL: No anorexia, nausea, vomiting or diarrhea. No abdominal pain or blood.  GENITOURINARY: No burning on urination, no polyuria NEUROLOGICAL: No headache, dizziness, syncope, paralysis, ataxia, numbness or tingling in the extremities. No change in bowel or bladder control.  MUSCULOSKELETAL: No muscle, back pain, joint pain or stiffness.  LYMPHATICS: No enlarged nodes. No history of splenectomy.  PSYCHIATRIC: No  history of depression or anxiety.  ENDOCRINOLOGIC: No reports of sweating, cold or heat intolerance. No polyuria or polydipsia.  Marland Kitchen   Physical Examination Today's Vitals   02/28/23 1401  BP: 128/76  Pulse: 73  SpO2: 90%  Weight: 192 lb (87.1 kg)  Height: 5\' 11"  (1.803 m)   Body mass index is 26.78 kg/m.  Gen: resting comfortably, no acute distress HEENT: no scleral icterus, pupils equal round and reactive, no palptable cervical adenopathy,  CV: RRR, no m/rg, no jvd  Resp: Clear to auscultation bilaterally GI: abdomen is soft, non-tender, non-distended, normal bowel sounds, no hepatosplenomegaly MSK: extremities are warm, no edema.  Skin: warm, no rash Neuro:  no focal deficits Psych: appropriate affect   Diagnostic Studies 09/2013 Echo   Study Conclusions  - Left ventricle: The cavity size was normal. Wall thickness was normal. Systolic function was mildly reduced. The estimated ejection fraction was in the range of 45% to 50%. Doppler parameters are consistent with abnormal left ventricular relaxation (grade 1 diastolic dysfunction). - Regional wall motion abnormality: Several of the views are foreshortened, however overall the apex does appear hypokinetic. Hypokinesis of the apical anterior, apical inferior, apical septal, apical lateral, and apical myocardium. Can consider echo with contrast for more definitive evaluation of wall motion. - Aortic valve: Mildly calcified annulus. Trileaflet; mildly thickened leaflets. Valve area (VTI): 3.76 cm^2. Valve area (Vmax): 3.65 cm^2. - Mitral valve: Mildly calcified annulus. Mildly thickened leaflets . 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 - Systemic veins: The IVC is small, suggesting low RA pressures and hypovolemia.    10/2012 Cath [facility]   RHC: RA 5, PA 40/12 (mean not reported) PCWP 12, PA sat 75%.   LHC: LM patent, LAD mid 20%, LCX patent, RCA anomolous origine from left cusp patent.  LVEF 70%.     10/2014 echo Study Conclusions   - Left ventricle: The cavity size was normal. Wall thickness was   increased in a pattern of mild LVH. Systolic function was normal.   The estimated ejection fraction was in the range of 60% to 65%.   Wall motion was normal; there were no regional wall motion   abnormalities. Doppler parameters are consistent with abnormal   left ventricular relaxation (grade 1 diastolic dysfunction). - Aortic valve: Mildly calcified annulus. Trileaflet; mildly   thickened leaflets. Valve area (VTI): 4.75 cm^2. Valve area   (Vmax): 3.73 cm^2. Valve area (Vmean): 3.54 cm^2. - Mitral valve: Mildly calcified annulus. Mildly thickened leaflets   . - Left atrium: The atrium was mildly dilated. - Atrial septum: No defect or patent foramen ovale was identified. - Pulmonary arteries: Systolic pressure was moderately increased.   PA peak pressure: 44 mm Hg (S). - Technically adequate study.   Jan 2023 echo IMPRESSIONS     1. Left ventricular ejection fraction, by estimation, is 45%. The left  ventricle has mildly decreased function. Left ventricular endocardial  border not optimally defined to evaluate regional wall motion. Left  ventricular diastolic parameters are  consistent with Grade I diastolic dysfunction (impaired relaxation).   2. Right ventricular systolic function is normal. The right ventricular  size is normal. There is normal pulmonary artery systolic pressure.   3. The mitral valve is normal in structure. No evidence of mitral valve  regurgitation. No evidence of mitral stenosis.   4. The tricuspid valve is abnormal.   5. The aortic valve has an indeterminant number of cusps. There is mild  calcification of the aortic valve. There is mild thickening of the aortic  valve. Aortic valve regurgitation is not visualized. No aortic stenosis is  present.   6. The inferior vena cava is normal in size with greater than 50%  respiratory variability,  suggesting right atrial pressure of 3 mmHg.      03/2022 echo IMPRESSIONS     1. Left ventricular ejection fraction, by estimation, is 45%. The left  ventricle has mildly decreased function. The left ventricle demonstrates  regional wall motion abnormalities (see  scoring diagram/findings for  description). Left ventricular diastolic   parameters are consistent with Grade I diastolic dysfunction (impaired  relaxation).   2. Right ventricular systolic function is normal. The right ventricular  size is normal.   3. The mitral valve is normal in structure. Trivial mitral valve  regurgitation. No evidence of mitral stenosis.   4. The aortic valve is tricuspid. Aortic valve regurgitation is not  visualized. No aortic stenosis is present.   5. The inferior vena cava is normal in size with greater than 50%  respiratory variability, suggesting right atrial pressure of 3 mmHg.      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 I worry about his CKD and changing to entresto - euvolemic today, continue current meds   2. HTN -at goal, continue current meds   3. Complete heart block -recent BiV upgrade to his pacemaker - continue to follow with EP       Antoine Poche, M.D.

## 2023-03-01 ENCOUNTER — Ambulatory Visit
Admission: RE | Admit: 2023-03-01 | Discharge: 2023-03-01 | Disposition: A | Discharge status: Home / Self Care | Payer: Medicare Other | Source: Ambulatory Visit | Attending: Radiation Oncology | Admitting: Radiation Oncology

## 2023-03-01 ENCOUNTER — Encounter: Payer: Self-pay | Admitting: Urology

## 2023-03-01 ENCOUNTER — Ambulatory Visit
Admission: RE | Admit: 2023-03-01 | Discharge: 2023-03-01 | Disposition: A | Discharge status: Home / Self Care | Payer: Medicare Other | Source: Ambulatory Visit | Attending: Urology | Admitting: Urology

## 2023-03-01 VITALS — Resp 20 | Ht 71.0 in | Wt 185.0 lb

## 2023-03-01 DIAGNOSIS — C61 Malignant neoplasm of prostate: Secondary | ICD-10-CM

## 2023-03-01 NOTE — Progress Notes (Signed)
Pre-seed nursing interview for a diagnosis of Stage T1c adenocarcinoma of the prostate with Gleason score of 4+3, and PSA of 7.82.  Patient identity verified x2.   Patient reports occasional bleeding Hemorid and denies all other issues at this time.  Meaningful use complete.  Urinary Management medication(s)- None Urology appointment date- 03/2023, with Dr. Mena Goes at [facility].  Resp 20   Ht 5\' 11"  (1.803 m)   Wt 185 lb (83.9 kg)   BMI 25.80 kg/m   This concludes the interaction.  [name], LPN

## 2023-03-12 NOTE — Progress Notes (Signed)
RN spoke with patient to assess any needs or questions prior to upcoming brachytherapy on 11/19.  Unfortunately patient did not receive information that was mailed out on brachytherapy.  I was able to send information via e-mail and encouraged patient to call with any questions once he reviewed.  No additional needs at this time, all questions answered.

## 2023-03-13 ENCOUNTER — Encounter: Payer: Self-pay | Admitting: Cardiology

## 2023-03-13 ENCOUNTER — Telehealth: Payer: Self-pay | Admitting: Cardiology

## 2023-03-13 ENCOUNTER — Encounter: Payer: Self-pay | Admitting: Cardiovascular Disease

## 2023-03-13 ENCOUNTER — Encounter (HOSPITAL_BASED_OUTPATIENT_CLINIC_OR_DEPARTMENT_OTHER): Payer: Self-pay | Admitting: Urology

## 2023-03-13 NOTE — Progress Notes (Signed)
My Note 1:35 PM   Edit   Delete   Copy   PERIOPERATIVE PRESCRIPTION FOR IMPLANTED CARDIAC DEVICE PROGRAMMING   Patient Information: Name:  Diallo Pawlus  DOB:  [birth year]  MRN:  [id number]  Planned Procedure: LIthotomy, radioactive seed implants and space oar  Surgeon:  dr Jerilee Field  Date of Procedure:  03-20-2023  Cautery will be used.  Position during surgery: Supine  Please send documentation back to:  [facility] (Fax # [phone])   Forrestine Him, RN  03/13/2023 1:02 PM  Device Information:   Clinic EP Physician:  York Pellant MD   Device Type:  Pacemaker Manufacturer and Phone #:  St. Jude/Abbott: [phone] Pacemaker Dependent?:  Yes.   Date of Last Device Check:  12/01/22 Normal Device Function?:  Yes.     Electrophysiologist's Recommendations:   Have magnet available. Provide continuous ECG monitoring when magnet is used or reprogramming is to be performed.  Procedure should not interfere with device function.  No device programming or magnet placement needed.   Per Device Clinic Standing Orders, Skip Mayer, RN  1:35 PM 03/13/2023

## 2023-03-13 NOTE — Progress Notes (Signed)
Remote pacemaker transmission.   

## 2023-03-13 NOTE — Telephone Encounter (Signed)
     Pre-operative Risk Assessment    Patient Name: Troy Petty  DOB: [birth year] MRN: [id number]      Request for Surgical Clearance    Procedure:   radioactive seed implant for prostate cancer   Date of Surgery:  Clearance 03/20/23                                 Surgeon:  Dr. Mena Goes  Surgeon's Group or Practice Name:  [facility] Phone number:  [phone] Fax number:  [phone]   Type of Clearance Requested:   - Medical  - Pharmacy:  Hold Aspirin 5 days   Type of Anesthesia:  General    Additional requests/questions:    Signed, Noe Gens   03/13/2023, 12:25 PM

## 2023-03-13 NOTE — Progress Notes (Signed)
Erroneous encounter

## 2023-03-13 NOTE — Progress Notes (Deleted)
PERIOPERATIVE PRESCRIPTION FOR IMPLANTED CARDIAC DEVICE PROGRAMMING  Patient Information: Name:  Troy Petty  DOB:  [birth year]  MRN:  [id number]  Planned Procedure: LIthotomy, radioactive seed implants and space oar  Surgeon:  dr Jerilee Field  Date of Procedure:  03-20-2023  Cautery will be used.  Position during surgery: Supine  Please send documentation back to:  [facility] (Fax # [phone])   Forrestine Him, RN  03/13/2023 1:02 PM  Device Information:  Clinic EP Physician:  York Pellant MD  Device Type:  Pacemaker Manufacturer and Phone #:  St. Jude/Abbott: [phone] Pacemaker Dependent?:  Yes.   Date of Last Device Check:  12/01/22 Normal Device Function?:  Yes.    Electrophysiologist's Recommendations:  Have magnet available. Provide continuous ECG monitoring when magnet is used or reprogramming is to be performed.  Procedure should not interfere with device function.  No device programming or magnet placement needed.  Per Device Clinic Standing Orders, Skip Mayer, RN  1:35 PM 03/13/2023

## 2023-03-13 NOTE — Telephone Encounter (Signed)
error 

## 2023-03-13 NOTE — Telephone Encounter (Signed)
   Patient Name: Troy Petty  DOB: [birth year] MRN: [id number]  Primary Cardiologist: Dina Rich, MD  Chart reviewed as part of pre-operative protocol coverage. Given past medical history and time since last visit, based on ACC/AHA guidelines, Troy Petty is at acceptable risk for the planned procedure without further cardiovascular testing. Just seen less than one month ago by Dr. Dominga Ferry and was stable.  Per office protocol, if patient is without any new symptoms or concerns at the time of their virtual visit, he/she may hold ASA for 5 days prior to procedure. Please resume ASA as soon as possible postprocedure, at the discretion of the surgeon.    The patient was advised that if he develops new symptoms prior to surgery to contact our office to arrange for a follow-up visit, and he verbalized understanding.  I will route this recommendation to the requesting party via Epic fax function and remove from pre-op pool.  Please call with questions.  Joni Reining, NP 03/13/2023, 1:00 PM

## 2023-03-14 ENCOUNTER — Ambulatory Visit: Payer: Medicare Other

## 2023-03-16 ENCOUNTER — Other Ambulatory Visit: Payer: Self-pay

## 2023-03-16 ENCOUNTER — Encounter (HOSPITAL_BASED_OUTPATIENT_CLINIC_OR_DEPARTMENT_OTHER): Payer: Self-pay | Admitting: Urology

## 2023-03-16 NOTE — Progress Notes (Signed)
Spoke w/ via phone for pre-op interview---pt Lab needs dos---- I stat        Lab results------ COVID test -----patient states asymptomatic no test needed Arrive at -------1115 am 03-20-2023 NPO after MN NO Solid Food.  Clear liquids from MN until---1015 Med rec completed Medications to take morning of surgery -----carvedilol,  albuterol inhaler prn/bring inhaler, amlodipine Diabetic medication -----none day of surgery Patient instructed no nail polish to be worn day of surgery Patient instructed to bring photo id and insurance card day of surgery Patient aware to have Driver (ride ) / caregiver    for 24 hours after surgery -  wife edna Patient Special Instructions -----fleets enema am of surgery Pre-Op special Instructions -----none Patient verbalized understanding of instructions that were given at this phone interview. Patient denies chest pain, sob, fever, cough at the interview.   Anesthesia Review:pacemaker ( complete heart block), cad, copd, ckd stage 3 Reviewed patient history with dr Viona Gilmore, pt meets [facility] guidelines for 03-20-2023 surgery per dr Leslye Peer mda  PCP: Cardiologist : dr branch Theron Arista 02-28-2023 Electrophysiology sees dr Nelly Laurence Chest x-ray : 08-21-2022 EKG :12-01-2022 epic/chart Echo :04-20-2022 epic Stress test:none Cardiac Cath : 11-18-2012 epic Activity level: does own housework, is active Sleep Study/ CPAP :uses cpap with oxygen at 2 liters at hs Fasting Blood Sugar :      / Checks Blood Sugar -- times a day:  occ checks blood sugar   Cardiac clearance/ASA / Instructions/ Last Dose : 81 mg asa last dose 03-15-2023, note to stop 81 mg asa 5 days before surgery Samara Deist lawrence np 03-13-2023 chart/epic  Pacer orders on chart/epic  Nephrology sees dr Glenna Fellows at [facility]

## 2023-03-19 ENCOUNTER — Telehealth: Payer: Self-pay | Admitting: *Deleted

## 2023-03-19 NOTE — Telephone Encounter (Signed)
CALLED PATIENT TO REMIND OF PROCEDURE FOR 03-20-23, SPOKE WITH PATIENT AND HE IS AWARE OF THIS PROCEDURE.

## 2023-03-20 ENCOUNTER — Encounter (HOSPITAL_BASED_OUTPATIENT_CLINIC_OR_DEPARTMENT_OTHER)
Admission: RE | Disposition: A | Discharge status: Home / Self Care | Payer: Self-pay | Source: Home / Self Care | Attending: Urology

## 2023-03-20 ENCOUNTER — Ambulatory Visit (HOSPITAL_COMMUNITY): Payer: Medicare Other

## 2023-03-20 ENCOUNTER — Ambulatory Visit (HOSPITAL_BASED_OUTPATIENT_CLINIC_OR_DEPARTMENT_OTHER): Payer: Medicare Other | Admitting: Anesthesiology

## 2023-03-20 ENCOUNTER — Ambulatory Visit (HOSPITAL_BASED_OUTPATIENT_CLINIC_OR_DEPARTMENT_OTHER)
Admission: RE | Admit: 2023-03-20 | Discharge: 2023-03-20 | Disposition: A | Discharge status: Home / Self Care | Payer: Medicare Other | Attending: Urology | Admitting: Urology

## 2023-03-20 ENCOUNTER — Encounter (HOSPITAL_BASED_OUTPATIENT_CLINIC_OR_DEPARTMENT_OTHER): Payer: Self-pay | Admitting: Urology

## 2023-03-20 ENCOUNTER — Other Ambulatory Visit: Payer: Self-pay

## 2023-03-20 DIAGNOSIS — Z950 Presence of cardiac pacemaker: Secondary | ICD-10-CM | POA: Diagnosis not present

## 2023-03-20 DIAGNOSIS — C61 Malignant neoplasm of prostate: Secondary | ICD-10-CM

## 2023-03-20 DIAGNOSIS — Z87891 Personal history of nicotine dependence: Secondary | ICD-10-CM | POA: Insufficient documentation

## 2023-03-20 DIAGNOSIS — M1990 Unspecified osteoarthritis, unspecified site: Secondary | ICD-10-CM | POA: Insufficient documentation

## 2023-03-20 DIAGNOSIS — G4730 Sleep apnea, unspecified: Secondary | ICD-10-CM | POA: Diagnosis not present

## 2023-03-20 DIAGNOSIS — J449 Chronic obstructive pulmonary disease, unspecified: Secondary | ICD-10-CM | POA: Diagnosis not present

## 2023-03-20 DIAGNOSIS — E1122 Type 2 diabetes mellitus with diabetic chronic kidney disease: Secondary | ICD-10-CM | POA: Diagnosis not present

## 2023-03-20 DIAGNOSIS — Z01818 Encounter for other preprocedural examination: Secondary | ICD-10-CM

## 2023-03-20 DIAGNOSIS — N183 Chronic kidney disease, stage 3 unspecified: Secondary | ICD-10-CM | POA: Insufficient documentation

## 2023-03-20 DIAGNOSIS — I2510 Atherosclerotic heart disease of native coronary artery without angina pectoris: Secondary | ICD-10-CM | POA: Diagnosis not present

## 2023-03-20 DIAGNOSIS — N401 Enlarged prostate with lower urinary tract symptoms: Secondary | ICD-10-CM | POA: Diagnosis not present

## 2023-03-20 DIAGNOSIS — R339 Retention of urine, unspecified: Secondary | ICD-10-CM | POA: Insufficient documentation

## 2023-03-20 DIAGNOSIS — I129 Hypertensive chronic kidney disease with stage 1 through stage 4 chronic kidney disease, or unspecified chronic kidney disease: Secondary | ICD-10-CM | POA: Insufficient documentation

## 2023-03-20 HISTORY — DX: Sleep apnea, unspecified: G47.30

## 2023-03-20 HISTORY — DX: Type 2 diabetes mellitus without complications: E11.9

## 2023-03-20 HISTORY — PX: CYSTOSCOPY: SHX5120

## 2023-03-20 HISTORY — PX: RADIOACTIVE SEED IMPLANT: SHX5150

## 2023-03-20 HISTORY — PX: SPACE OAR INSTILLATION: SHX6769

## 2023-03-20 LAB — POCT I-STAT, CHEM 8
BUN: 19 mg/dL (ref 8–23)
BUN: 29 mg/dL — ABNORMAL HIGH (ref 8–23)
Calcium, Ion: 1.15 mmol/L (ref 1.15–1.40)
Calcium, Ion: 1.27 mmol/L (ref 1.15–1.40)
Chloride: 106 mmol/L (ref 98–111)
Chloride: 107 mmol/L (ref 98–111)
Creatinine, Ser: 1.6 mg/dL — ABNORMAL HIGH (ref 0.61–1.24)
Creatinine, Ser: 1.7 mg/dL — ABNORMAL HIGH (ref 0.61–1.24)
Glucose, Bld: 136 mg/dL — ABNORMAL HIGH (ref 70–99)
Glucose, Bld: 141 mg/dL — ABNORMAL HIGH (ref 70–99)
HCT: 50 % (ref 39.0–52.0)
HCT: 53 % — ABNORMAL HIGH (ref 39.0–52.0)
Hemoglobin: 17 g/dL (ref 13.0–17.0)
Hemoglobin: 18 g/dL — ABNORMAL HIGH (ref 13.0–17.0)
Potassium: 3.6 mmol/L (ref 3.5–5.1)
Potassium: 6.9 mmol/L (ref 3.5–5.1)
Sodium: 139 mmol/L (ref 135–145)
Sodium: 143 mmol/L (ref 135–145)
TCO2: 21 mmol/L — ABNORMAL LOW (ref 22–32)
TCO2: 24 mmol/L (ref 22–32)

## 2023-03-20 SURGERY — INSERTION, RADIATION SOURCE, PROSTATE
Anesthesia: General | Site: Prostate

## 2023-03-20 MED ORDER — LIDOCAINE HCL (PF) 2 % IJ SOLN
INTRAMUSCULAR | Status: AC
  Filled 2023-03-20: qty 20

## 2023-03-20 MED ORDER — EPHEDRINE SULFATE (PRESSORS) 50 MG/ML IJ SOLN
INTRAMUSCULAR | Status: DC | PRN
  Administered 2023-03-20 (×4): 5 mg via INTRAVENOUS

## 2023-03-20 MED ORDER — CIPROFLOXACIN IN D5W 400 MG/200ML IV SOLN
400.0000 mg | INTRAVENOUS | Status: AC
  Administered 2023-03-20: 400 mg via INTRAVENOUS

## 2023-03-20 MED ORDER — FLEET ENEMA RE ENEM: 1.0000 | ENEMA | Freq: Once | RECTAL | Status: DC

## 2023-03-20 MED ORDER — SODIUM CHLORIDE 0.9 % IR SOLN
Status: DC | PRN
  Administered 2023-03-20: 500 mL via INTRAVESICAL
  Administered 2023-03-20: 1000 mL

## 2023-03-20 MED ORDER — FENTANYL CITRATE (PF) 100 MCG/2ML IJ SOLN
INTRAMUSCULAR | Status: DC | PRN
  Administered 2023-03-20: 50 ug via INTRAVENOUS
  Administered 2023-03-20 (×2): 25 ug via INTRAVENOUS

## 2023-03-20 MED ORDER — FENTANYL CITRATE (PF) 100 MCG/2ML IJ SOLN
INTRAMUSCULAR | Status: AC
  Filled 2023-03-20: qty 2

## 2023-03-20 MED ORDER — LIDOCAINE HCL (CARDIAC) PF 100 MG/5ML IV SOSY
PREFILLED_SYRINGE | INTRAVENOUS | Status: DC | PRN
  Administered 2023-03-20: 50 mg via INTRAVENOUS

## 2023-03-20 MED ORDER — CIPROFLOXACIN IN D5W 400 MG/200ML IV SOLN
INTRAVENOUS | Status: AC
  Filled 2023-03-20: qty 200

## 2023-03-20 MED ORDER — ROCURONIUM BROMIDE 100 MG/10ML IV SOLN
INTRAVENOUS | Status: DC | PRN
  Administered 2023-03-20: 50 mg via INTRAVENOUS

## 2023-03-20 MED ORDER — SODIUM CHLORIDE (PF) 0.9 % IJ SOLN
INTRAMUSCULAR | Status: DC | PRN
  Administered 2023-03-20: 10 mL

## 2023-03-20 MED ORDER — DEXAMETHASONE SODIUM PHOSPHATE 4 MG/ML IJ SOLN
INTRAMUSCULAR | Status: DC | PRN
  Administered 2023-03-20: 4 mg via INTRAVENOUS

## 2023-03-20 MED ORDER — ACETAMINOPHEN 10 MG/ML IV SOLN: 1000.0000 mg | Freq: Once | INTRAVENOUS | Status: DC | PRN

## 2023-03-20 MED ORDER — STERILE WATER FOR IRRIGATION IR SOLN
Status: DC | PRN
Start: 2023-03-20 — End: 2023-03-20
  Administered 2023-03-20: 3 mL

## 2023-03-20 MED ORDER — LACTATED RINGERS IV SOLN
INTRAVENOUS | Status: DC | PRN
Start: 2023-03-20 — End: 2023-03-20

## 2023-03-20 MED ORDER — SUGAMMADEX SODIUM 200 MG/2ML IV SOLN
INTRAVENOUS | Status: DC | PRN
  Administered 2023-03-20: 200 mg via INTRAVENOUS

## 2023-03-20 MED ORDER — PROPOFOL 10 MG/ML IV BOLUS
INTRAVENOUS | Status: DC | PRN
  Administered 2023-03-20: 100 mg via INTRAVENOUS
  Administered 2023-03-20: 50 mg via INTRAVENOUS

## 2023-03-20 MED ORDER — FENTANYL CITRATE (PF) 100 MCG/2ML IJ SOLN
25.0000 ug | INTRAMUSCULAR | Status: DC | PRN
Start: 2023-03-20 — End: 2023-03-20

## 2023-03-20 MED ORDER — PHENYLEPHRINE HCL (PRESSORS) 10 MG/ML IV SOLN
INTRAVENOUS | Status: DC | PRN
  Administered 2023-03-20 (×6): 80 ug via INTRAVENOUS

## 2023-03-20 MED ORDER — PROPOFOL 10 MG/ML IV BOLUS
INTRAVENOUS | Status: AC
  Filled 2023-03-20: qty 20

## 2023-03-20 MED ORDER — DEXAMETHASONE SODIUM PHOSPHATE 10 MG/ML IJ SOLN
INTRAMUSCULAR | Status: AC
  Filled 2023-03-20: qty 1

## 2023-03-20 MED ORDER — SODIUM CHLORIDE 0.9 % IV SOLN: INTRAVENOUS | Status: DC

## 2023-03-20 MED ORDER — ONDANSETRON HCL 4 MG/2ML IJ SOLN
INTRAMUSCULAR | Status: DC | PRN
  Administered 2023-03-20: 4 mg via INTRAVENOUS

## 2023-03-20 MED ORDER — IOHEXOL 300 MG/ML  SOLN
INTRAMUSCULAR | Status: DC | PRN
  Administered 2023-03-20: 7 mL

## 2023-03-20 SURGICAL SUPPLY — 44 items
BAG URINE DRAIN 2000ML AR STRL (UROLOGICAL SUPPLIES) ×2 IMPLANT
BARD QUICK LINK CARTRIDGES with BRACHY SOURCE 1-12 IMPLANT
BLADE CLIPPER SENSICLIP SURGIC (BLADE) ×2 IMPLANT
CATH FOLEY 2WAY SLVR 5CC 16FR (CATHETERS) ×2 IMPLANT
CATH ROBINSON RED A/P 16FR (CATHETERS) IMPLANT
CATH ROBINSON RED A/P 20FR (CATHETERS) ×2 IMPLANT
CLOTH BEACON ORANGE TIMEOUT ST (SAFETY) ×2 IMPLANT
CNTNR URN SCR LID CUP LEK RST (MISCELLANEOUS) ×2 IMPLANT
COVER BACK TABLE 60X90IN (DRAPES) ×2 IMPLANT
COVER MAYO STAND STRL (DRAPES) ×2 IMPLANT
DRSG TEGADERM 4X4.75 (GAUZE/BANDAGES/DRESSINGS) ×2 IMPLANT
DRSG TEGADERM 8X12 (GAUZE/BANDAGES/DRESSINGS) ×2 IMPLANT
GAUZE SPONGE 4X4 12PLY STRL (GAUZE/BANDAGES/DRESSINGS) IMPLANT
GEL ULTRASOUND 20GR AQUASONIC (MISCELLANEOUS) ×4 IMPLANT
GLOVE BIO SURGEON STRL SZ 6.5 (GLOVE) IMPLANT
GLOVE BIO SURGEON STRL SZ7.5 (GLOVE) ×2 IMPLANT
GLOVE BIO SURGEON STRL SZ8 (GLOVE) IMPLANT
GLOVE BIOGEL PI IND STRL 6.5 (GLOVE) IMPLANT
GLOVE SURG ORTHO 8.5 STRL (GLOVE) ×2 IMPLANT
GOWN STRL REUS W/TWL LRG LVL3 (GOWN DISPOSABLE) ×2 IMPLANT
GOWN STRL REUS W/TWL XL LVL3 (GOWN DISPOSABLE) IMPLANT
GRID BRACH TEMP 18GA 2.8X3X.75 (MISCELLANEOUS) ×2 IMPLANT
HOLDER FOLEY CATH W/STRAP (MISCELLANEOUS) ×2 IMPLANT
IMPL SPACEOAR VUE SYSTEM (Spacer) ×2 IMPLANT
IMPLANT SPACEOAR VUE SYSTEM (Spacer) ×2 IMPLANT
IV NS 1000ML BAXH (IV SOLUTION) ×2 IMPLANT
IV NS 500ML BAXH (IV SOLUTION) IMPLANT
KIT TURNOVER CYSTO (KITS) ×2 IMPLANT
NDL BRACHY 18G 5PK (NEEDLE) ×8 IMPLANT
NDL BRACHY 18G SINGLE (NEEDLE) IMPLANT
NDL PK MORGANSTERN STABILIZ (NEEDLE) ×2 IMPLANT
NEEDLE BRACHY 18G 5PK (NEEDLE) ×8 IMPLANT
NEEDLE BRACHY 18G SINGLE (NEEDLE) IMPLANT
NEEDLE PK MORGANSTERN STABILIZ (NEEDLE) ×2 IMPLANT
PACK CYSTO (CUSTOM PROCEDURE TRAY) ×2 IMPLANT
SHEATH ULTRASOUND LF (SHEATH) IMPLANT
SHEATH ULTRASOUND LTX NONSTRL (SHEATH) IMPLANT
SLEEVE SCD COMPRESS KNEE MED (STOCKING) ×2 IMPLANT
SUT BONE WAX W31G (SUTURE) IMPLANT
SYR 10ML LL (SYRINGE) ×2 IMPLANT
SYR CONTROL 10ML LL (SYRINGE) ×2 IMPLANT
TOWEL OR 17X24 6PK STRL BLUE (TOWEL DISPOSABLE) ×2 IMPLANT
UNDERPAD 30X36 HEAVY ABSORB (UNDERPADS AND DIAPERS) ×4 IMPLANT
WATER STERILE IRR 500ML POUR (IV SOLUTION) ×2 IMPLANT

## 2023-03-20 NOTE — Transfer of Care (Addendum)
Immediate Anesthesia Transfer of Care Note  Patient: Troy Petty  Procedure(s) Performed: RADIOACTIVE SEED IMPLANT/BRACHYTHERAPY IMPLANT (Prostate) SPACE OAR INSTILLATION (Prostate) CYSTOSCOPY FLEXIBLE (Bladder)  Patient Location: PACU  Anesthesia Type:General  Level of Consciousness: awake and patient cooperative  Airway & Oxygen Therapy: Patient Spontanous Breathing and Patient connected to nasal cannula oxygen  Post-op Assessment: Report given to RN and Post -op Vital signs reviewed and stable  Post vital signs: Reviewed and stable  Last Vitals:  Vitals Value Taken Time  BP 150/88 03/20/23 1517  Temp    Pulse 73 03/20/23 1522  Resp 11 03/20/23 1522  SpO2 96 % 03/20/23 1522  Vitals shown include unfiled device data.  Last Pain:  Vitals:   03/20/23 1157  TempSrc: Oral  PainSc: 0-No pain      Patients Stated Pain Goal: 5 (03/20/23 1157)  Complications: No notable events documented.

## 2023-03-20 NOTE — Progress Notes (Signed)
Radiation Oncology         [phone] ________________________________  Name: Troy Petty MRN: [id number]  Date: 03/20/2023  DOB: 10-29-1950       Prostate Seed Implant  VO:[id number], Troy Picket, DO  No ref. provider found  DIAGNOSIS:  72 y.o. gentleman with Stage T1c adenocarcinoma of the prostate with Gleason score of 4+3, and PSA of 7.82.  Oncology History  Malignant neoplasm of prostate (HCC)  12/15/2022 Cancer Staging   Staging form: Prostate, AJCC 8th Edition - Clinical stage from 12/15/2022: Stage IIC (cT1c, cN0, cM0, PSA: 7.8, Grade Group: 3) - Signed by Marcello Fennel, PA-C on 01/16/2023 Histopathologic type: Adenocarcinoma, NOS Stage prefix: Initial diagnosis Prostate specific antigen (PSA) range: Less than 10 Gleason primary pattern: 4 Gleason secondary pattern: 3 Gleason score: 7 Histologic grading system: 5 grade system Number of biopsy cores examined: 12 Number of biopsy cores positive: 4 Location of positive needle core biopsies: Both sides   01/16/2023 Initial Diagnosis   Malignant neoplasm of prostate (HCC)       ICD-10-CM   1. Pre-op testing  Z01.818 CBG per Guidelines for Diabetes Management for Patients Undergoing Surgery ([facility], [facility], and [facility] only)    CBG per protocol    I-Stat, Chem 8 on day of surgery per protocol    CBG per Guidelines for Diabetes Management for Patients Undergoing Surgery ([facility], [facility], and [facility] only)    CBG per protocol    I-Stat, Chem 8 on day of surgery per protocol      PROCEDURE: Insertion of radioactive I-125 seeds into the prostate gland.  RADIATION DOSE: 145 Gy, definitive therapy.  TECHNIQUE: Troy Petty was brought to the operating room with the urologist. He was placed in the dorsolithotomy position. He was catheterized and a rectal tube was inserted. The perineum was shaved, prepped and draped. The ultrasound probe was then introduced by me into the rectum to see the prostate gland.  TREATMENT DEVICE: I attached the  needle grid to the ultrasound probe stand and anchor needles were placed.  3D PLANNING: The prostate was imaged in 3D using a sagittal sweep of the prostate probe. These images were transferred to the planning computer. There, the prostate, urethra and rectum were defined on each axial reconstructed image. Then, the software created an optimized 3D plan and a few seed positions were adjusted. The quality of the plan was reviewed using [facility] information for the target and the following two organs at risk:  Urethra and Rectum.  Then the accepted plan was printed and handed off to the radiation therapist.  Under my supervision, the custom loading of the seeds and spacers was carried out using the quick loader.  These pre-loaded needles were then placed into the needle holder.Marland Kitchen  PROSTATE VOLUME STUDY:  Using transrectal ultrasound the volume of the prostate was verified to be 46.4 cc.  SPECIAL TREATMENT PROCEDURE/SUPERVISION AND HANDLING: The pre-loaded needles were then delivered by the urologist under sagittal guidance. A total of 18 needles were used to deposit 72 seeds in the prostate gland. The individual seed activity was 0.443 mCi.  SpaceOAR:  Yes  COMPLEX SIMULATION: At the end of the procedure, an anterior radiograph of the pelvis was obtained to document seed positioning and count. Cystoscopy was performed by the urologist to check the urethra and bladder.  One seed was seen in the urethra at the widening of the verumontanum on the right.  This was recognized as the third seed in the F3 needle, and  Dr. Mena Goes removed it with graspers without disturbing the other seeds in that strand.  The intraoperative plan was modified to assess the dosimetric impact of that change, and the prostate coverage remained 98% at prescription level, so the total number of seeds in the implant was 71.  MICRODOSIMETRY: At the end of the procedure, the patient was emitting 0.05 mR/hr at 1 meter. Accordingly, he was  considered safe for hospital discharge.  PLAN: The patient will return to the radiation oncology clinic for post implant CT dosimetry in three weeks.   ________________________________  Artist Pais Kathrynn Running, M.D.

## 2023-03-20 NOTE — H&P (Signed)
H&P  Chief Complaint: prostate cancer   History of Present Illness: Troy Petty is a 72 y.o. male with a diagnosis of prostate cancer. He has a h/o of urinary retention, BPH with LUTS, and elevated PSA. He is s/p green light laser vaporization of prostate in 01/2014. He had an elevated PSA prior to this procedure, and it subsequently decreased following treatment. His PSA did rise again in 2019 and has been gradually rising since that time. He initially underwent prostate biopsy in 12/2020 and was found to have low volume Gleason 3+3 prostatic adenocarcinoma. PSA at that time was 7.29. He opted to proceed with active surveillance. His PSA decreased to 5.74 in 10/2021 but again increased to 7.34 in 06/2022. This remained elevated at 7.82 when repeated in 09/2022.  He was unable to undergo prostate MRI due to having a pacemaker so they agreed to proceed with surveillance prostate biopsy on 12/15/22. The prostate volume measured 36.39 cc.  Out of 12 core biopsies, 3 were positive.  The maximum Gleason score was 4+3, and this was seen in the  right base lateral and right mid lateral. Additionally, a small focus of Gleason 3+3 was seen in the left apex lateral.   After considering all his options he presents today for low-dose rate permanent prostate brachytherapy and SpaceOAR biodegradable gel insertion.  Has been well.  No dysuria or gross hematuria.  No cough cold or congestion.  Past Medical History:  Diagnosis Date   Arthritis    MIDDLE FINGER AND RING FINGER BOTH HANDS   Chronic kidney disease, stage III (moderate) (HCC)    Chronic renal insufficiency    baseline creatinine is 1.8   Chronic systolic dysfunction of left ventricle    EF 40-45% in setting of PMT on limited echo   Complete heart block (HCC)    COPD (chronic obstructive pulmonary disease) (HCC)    Coronary artery disease    NON OBSTRUCTIVE CAD; DR. Wyline Mood WITH [facility] IS PT'S CARDIOLOGIST   Diabetes mellitus without  complication (HCC) type 2    Elevated PSA    GERD (gastroesophageal reflux disease)    Hemorrhoids    Hypertension    Metabolic syndrome    Pacemaker    IMPLANTED Mar 14, 2013 - DR. ALLRED WITH [facility] FOLLOWS DEVICE FUNCTIONING   Pain    BACK PAIN AT TIMES   Shortness of breath    WITH EXERTION   Sleep apnea    uses cpap and oxygen set on 2 ( only uses oxygen 2 liters at hs with cpap)   Vitamin B12 deficiency    Past Surgical History:  Procedure Laterality Date   BIV UPGRADE N/A 08/21/2022   Procedure: BIV PPM UPGRADE;  Surgeon: Mealor, Roberts Gaudy, MD;  Location: [facility];  Service: Cardiovascular;  Laterality: N/A;   CARDIAC CATHETERIZATION  2014   COLON SURGERY     COLON RESECTION FOR BLOCKAGE   COLONOSCOPY N/A 04/12/2016   Dr. Jena Gauss: 10 mm polyp, two 4 mm polyps at transverse, tubulovillous adenoma, surveillance in 3 years.    COLONOSCOPY N/A 04/12/2016   Dr. Karilyn Cota: post-polypectomy bleed s/p clips at transverse colon    GREEN LIGHT LASER TURP (TRANSURETHRAL RESECTION OF PROSTATE N/A 02/13/2014   Procedure: GREEN LIGHT LASER TURP (TRANSURETHRAL RESECTION OF PROSTATE;  Surgeon: Jerilee Field, MD;  Location: [facility];  Service: Urology;  Laterality: N/A;   OTHER SURGICAL HISTORY     Intestinal blockage x2   OTHER  SURGICAL HISTORY  1964   Football injury   PACEMAKER INSERTION  03/14/2013   SJM Endurity implanted by Dr Nedra Hai in [place] for complete heart block   PROSTATE BIOPSY      Home Medications:  No medications prior to admission.   Allergies: No Known Allergies  Family History  Problem Relation Age of Onset   Cancer Father    Colon cancer Father    Social History:  reports that he quit smoking about 11 years ago. His smoking use included cigarettes. He started smoking about 56 years ago. He has a 45 pack-year smoking history. He has never used smokeless tobacco. He reports current alcohol use. He reports that he does not use  drugs.  ROS: A complete review of systems was performed.  All systems are negative except for pertinent findings as noted. Review of Systems  All other systems reviewed and are negative.    Physical Exam:  Vital signs in last 24 hours:   General:  Alert and oriented, No acute distress HEENT: Normocephalic, atraumatic Cardiovascular: Regular rate and rhythm Lungs: Regular rate and effort Abdomen: Soft, nontender, nondistended, no abdominal masses Back: No CVA tenderness Extremities: No edema Neurologic: Grossly intact  Laboratory Data:  No results found for this or any previous visit (from the past 24 hour(s)). No results found for this or any previous visit (from the past 240 hour(s)). Creatinine: No results for input(s): "CREATININE" in the last 168 hours.  Impression/Assessment:  Prostate cancer -   Plan:  I discussed with the patient, wife and son the nature, potential benefits, risks and alternatives to permanent low-dose rate prostate brachytherapy and SpaceOAR biodegradable gel insertion, including side effects of the proposed treatment, the likelihood of the patient achieving the goals of the procedure, and any potential problems that might occur during the procedure or recuperation.  We discussed the risk of rectal, bladder and urethral injury among others.  All questions answered. Patient elects to proceed.   Jerilee Field 03/20/2023

## 2023-03-20 NOTE — Anesthesia Procedure Notes (Signed)
Procedure Name: Intubation Date/Time: 03/20/2023 3:52 AM  Performed by: Earmon Phoenix, CRNAPre-anesthesia Checklist: Patient identified, Emergency Drugs available, Suction available, Patient being monitored and Timeout performed Patient Re-evaluated:Patient Re-evaluated prior to induction Oxygen Delivery Method: Circle system utilized Preoxygenation: Pre-oxygenation with 100% oxygen Induction Type: IV induction Ventilation: Mask ventilation without difficulty Laryngoscope Size: Mac and 4 Grade View: Grade I Tube type: Oral Tube size: 7.5 mm Number of attempts: 1 Airway Equipment and Method: Stylet Placement Confirmation: ETT inserted through vocal cords under direct vision, positive ETCO2 and breath sounds checked- equal and bilateral Secured at: 23 cm Tube secured with: Tape Dental Injury: Teeth and Oropharynx as per pre-operative assessment

## 2023-03-20 NOTE — Op Note (Signed)
Preoperative diagnosis: Prostate cancer Postoperative diagnosis: Same   Procedure: Prostate brachytherapy seed implant, SpaceOAR biodegradable gel insertion, Cystoscopy   Surgeon: Mena Goes   Radiation oncologist: Kathrynn Running   Anesthesia: Gen.   Indication for procedure: 71 year old with prostate cancer who elected to proceed with prostate brachytherapy.   Findings: On fluoroscopic imaging there was adequate coverage of the prostate. On cystoscopy the urethra appeared normal, the prostatic urethra appeared normal, the trigone and ureteral orifices appeared normal with clear efflux. The bladder mucosa appeared normal. There were no stones, foreign bodies or seeds visualized in the bladder.   Dose:145 Gy   Description of procedure: After consent was obtained patient brought to the operating room. After adequate anesthesia he is placed in lithotomy position and the transrectal ultrasound probe and perineal template positioned. Catheters and brachytherapy seeds were placed per Dr. Broadus John plan. A total of 18 catheters and 72 active sources (I-125) were placed with one removed leaving 71 (see below). The anchoring needles, template and ultrasound were removed.   The 18-gauge needle was then inserted approximately 1 to 2 cm anterior to the anal opening and directed under ultrasonic guidance into the perirectal fat between the anterior rectal wall and the prostate capsule down to the mid-gland. Midline needle position was confirmed in the sagittal and axial views to verify the tip was in the perirectal fat.  Small amounts of saline were injected to hydrodissect the space between the prostate and the anterior rectal wall.  Axial imaging was viewed to confirm the needle was in the correct location in the mid gland and centered.  Aspiration confirmed no intravascular access.  The saline syringe was carefully disconnected maintaining the desired needle position and the hydrogel was attached to the needle.   Under ultrasound guidance in the sagittal view a smooth continuous injection was done over about 12 seconds delivering the hydrogel into the space between the prostate and rectal wall.  The needle was withdrawn.  Scout flouro imaging was obtained of the implant. The Foley was removed. Another image obtained. The patient was prepped again and cystoscopy was performed which revealed a seed visible in the prostatic urethra and the mid right prostatic urethra (patient's right).  It was grasped with flexible graspers and removed.  This was then a strand of 4 seeds leaving 3 seeds.  The coverage was still 100% on the Grafix.  Thus a total of 71 active sources remained.  Cystourethroscopy was repeated and noted to be normal.  Retroflex revealed normal bladder and bladder neck. He was awakened taken to the recovery room in stable condition.   Complications: None   Blood loss: Minimal   Specimens: None   Drains: None   Disposition: Patient stable to PACU.

## 2023-03-20 NOTE — Discharge Instructions (Signed)
PROSTATE CANCER TREATMENT WITH RADIOACTIVE IODINE-125 SEED IMPLANT  This instruction sheet is intended to discuss implantation of Iodine-125 seeds as treatment for cancer of the prostate. It will explain in detail what you may expect from this treatment and what precautions are necessary as a result of the treatment. Iodine-125 emits a relatively low energy radiation. The radioactive seeds are surgically implanted directly into the prostate gland. Most of the radiation is contained within the prostate gland. A very small amount is present outside the body.The precautions that we ask you to take are to ensure that those around you are protected from unnecessary radiation. The principles of radiation safety that you need to understand are:  DISTANCE: The further a person is from the radioactive implant the less radiation they will be receiving. The amount of radiation received falls off quite rapidly with distance. More specific guidelines are given in the table on the last page.  TIME: The amount of radiation a person is exposed to is directly proportional to the amount of time that is spent in close proximity to the radioactive implant. Very little radiation will be received during short periods. See the table on the last page for more specific guideline.  CHILDREN UNDER AGE 58 Children should not be allowed to sit on your lap or otherwise be in very close contact for more than a few minutes for the first 6-8 weeks following the implant. You may affectionately greet (hug/kiss) a child for a short period of time, but remember, the longer you are in close proximity with that child the more radiation they are being exposed to. At a distance of 6 feet there is no limit to the length of time you may spend together. See specific guidelines on the last page.  PREGNANT OR POSSIBLY PREGNANT WOMEN Pregnant women should avoid prolonged close physical contact with you for the first 6-8 weeks after implant. At a  distance of 6 feet there is no limit to the length of time you may spend together. Pregnant women or possibly pregnant women can safely be in close contact with you for a limited period of time. See the last page for guidelines.  FAMILY RELATIONS You may sleep in the same bed as your partner (provided she is not pregnant or under the age of 58). Sexual intercourse, using a condom, may be resumed 2 weeks after the implant. Your semen may be discolored, dark brown or black. This is normal and is the result of bleeding that may have occurred during the implant. After 3-4 weeks it will not be necessary to use a condom.  DAILY ACTIVITIES You may resume normal activities in a few days (example: work, shopping, church) without the risk of harmful radiation exposure to those around you provided you keep in mind the time and distance precautions. Objects that you touch or item that you use do not become radioactive. Linens, clothing, tableware, and dishes may be used by other persons without special precautions. Your bodily wastes (urine and stool) are not radioactive.  SPECIAL PRECAUTIONS It is possible to lose implanted Iodine-125 seed(s) through urination. Although it is possible to pass seeds indefinitely, it is most likely to occur immediately after catheter removal. To prevent this from happening the catheter that was in place during the implant procedure is removed immediately after the implant and a cystoscopy procedure is performed. The process of removing the catheter and the cystoscopy procedure should dislodge and remove any seeds that are not firmly imbedded in the prostate  tissue. However, you should watch for seeds if/when you remove your catheter at home. The seeds are silver colored and the size of a grain of rice. In the unlikely event that a seed is seen after urination, simply flush the seed down the toilet. The seed should not be handled with your fingers, not even with a glove or napkin. A  spoon or tweezers can be used to pick up a seed. The Radiation Oncology department is open Monday - Friday from 8:00 am to 5:30 pm with a Radiation Oncologist on call at all times. He or she may be reached by calling [phone]. If you are to be hospitalized or if death should occur, your family should notify the Actor.  SIDE EFFECTS There are very few side effects associate with the implant procedure. Minor burning with urination, weak stream, hesitancy, intermittency, frequency, mild pain or feeling unable to pass your urine freely are common and usually stop in one to four months. If these symptoms are extremely uncomfortable, contact your physician.  RADIATION SAFETY GUIDELINES PROSTATE CANCER TREATMENT WITH RADIOACTIVE IODINE-125 SEED IMPLANT  The following guidelines will limit exposure to less than naturally occurring background radiation.  PERSONS AGE 70-45 (if able to become pregnant)  FOR 8 WEEKS FOLLOWING IMPLANT  At a distance of 1 foot: limit time to less than 2 hours/week At a distance of 3 feet: limit time to 20 hours/week At a distance of 6 feet: no restrictions  AFTER 8 WEEKS No restrictions  CHILDREN UNDER AGE 70, PREGNANT WOMEN OR POSSIBLY PREGNANT WOMEN  FOR 8 WEEKS FOLLOWING IMPLANT At a distance of 1 foot: limit time to 10 minutes/week At a distance of 3 feet: limit time to 2 hours/week At a distance of 6 feet: no restrictions  AFTER 8 WEEKS No restrictions  PERSONS OVER THE AGE OF 45 AND DO NOT EXPECT TO HAVE ANY MORE CHILDREN No restrictions  Updated by [facility] in January 2020

## 2023-03-20 NOTE — Anesthesia Preprocedure Evaluation (Addendum)
Anesthesia Evaluation  Patient identified by MRN, date of birth, ID band Patient awake    Reviewed: Allergy & Precautions, NPO status , Patient's Chart, lab work & pertinent test results  Airway Mallampati: II  TM Distance: >3 FB Neck ROM: Full    Dental  (+) Partial Upper   Pulmonary sleep apnea and Continuous Positive Airway Pressure Ventilation , COPD, former smoker   Pulmonary exam normal        Cardiovascular hypertension, Pt. on medications + CAD  + dysrhythmias + pacemaker  Rhythm:Regular Rate:Normal  ECHO 12/23:  1. Left ventricular ejection fraction, by estimation, is 45%. The left  ventricle has mildly decreased function. The left ventricle demonstrates  regional wall motion abnormalities (see scoring diagram/findings for  description). Left ventricular diastolic   parameters are consistent with Grade I diastolic dysfunction (impaired  relaxation).   2. Right ventricular systolic function is normal. The right ventricular  size is normal.   3. The mitral valve is normal in structure. Trivial mitral valve  regurgitation. No evidence of mitral stenosis.   4. The aortic valve is tricuspid. Aortic valve regurgitation is not  visualized. No aortic stenosis is present.   5. The inferior vena cava is normal in size with greater than 50%  respiratory variability, suggesting right atrial pressure of 3 mmHg.   Comparison(s): Echocardiogram done 05/13/21 showed an EF of 45%.     Neuro/Psych negative neurological ROS  negative psych ROS   GI/Hepatic Neg liver ROS,GERD  Medicated,,  Endo/Other  diabetes    Renal/GU   negative genitourinary   Musculoskeletal  (+) Arthritis , Osteoarthritis,    Abdominal Normal abdominal exam  (+)   Peds  Hematology  (+) Blood dyscrasia, anemia   Anesthesia Other Findings   Reproductive/Obstetrics                             Anesthesia  Physical Anesthesia Plan  ASA: 3  Anesthesia Plan: General   Post-op Pain Management:    Induction: Intravenous  PONV Risk Score and Plan: 2 and Ondansetron, Dexamethasone and Treatment may vary due to age or medical condition  Airway Management Planned: Mask and Oral ETT  Additional Equipment: None  Intra-op Plan:   Post-operative Plan: Extubation in OR  Informed Consent: I have reviewed the patients History and Physical, chart, labs and discussed the procedure including the risks, benefits and alternatives for the proposed anesthesia with the patient or authorized representative who has indicated his/her understanding and acceptance.     Dental advisory given  Plan Discussed with: CRNA  Anesthesia Plan Comments:        Anesthesia Quick Evaluation

## 2023-03-21 NOTE — Anesthesia Postprocedure Evaluation (Signed)
Anesthesia Post Note  Patient: Jasyn Marra  Procedure(s) Performed: RADIOACTIVE SEED IMPLANT/BRACHYTHERAPY IMPLANT (Prostate) SPACE OAR INSTILLATION (Prostate) CYSTOSCOPY FLEXIBLE (Bladder)     Patient location during evaluation: PACU Anesthesia Type: General Level of consciousness: awake and alert Pain management: pain level controlled Vital Signs Assessment: post-procedure vital signs reviewed and stable Respiratory status: spontaneous breathing, nonlabored ventilation, respiratory function stable and patient connected to nasal cannula oxygen Cardiovascular status: blood pressure returned to baseline and stable Postop Assessment: no apparent nausea or vomiting Anesthetic complications: no   No notable events documented.  Last Vitals:  Vitals:   03/20/23 1600 03/20/23 1635  BP: 136/74 (!) 144/84  Pulse:  82  Resp:  18  Temp: 36.6 C 36.6 C  SpO2: 95% 95%    Last Pain:  Vitals:   03/21/23 1028  TempSrc:   PainSc: 0-No pain                 Earl Lites P Lucella Pommier

## 2023-03-22 ENCOUNTER — Encounter (HOSPITAL_BASED_OUTPATIENT_CLINIC_OR_DEPARTMENT_OTHER): Payer: Self-pay | Admitting: Urology

## 2023-04-02 ENCOUNTER — Telehealth: Payer: Self-pay | Admitting: Medical Oncology

## 2023-04-02 NOTE — Telephone Encounter (Signed)
Appt confirmed for 04/11/23.

## 2023-04-02 NOTE — Progress Notes (Signed)
Patient was a RadOnc Consult on 01/16/23 for his stage T1c adenocarcinoma of the prostate with Gleason score of 4+3, and PSA of 7.82.  Patient proceed with treatment recommendations of brachytherapy and had his treatment on 03/20/23.   Patient is scheduled for a CT Simulation post seed on 12/11 and has post op with urology on 12/3.    RN left message for call back to review post treatment education.

## 2023-04-10 ENCOUNTER — Telehealth: Payer: Self-pay | Admitting: *Deleted

## 2023-04-10 NOTE — Telephone Encounter (Signed)
CALLED PATIENT TO  REMIND OF POST SEED APPTS. FOR 04-11-23, SPOKE WITH PATIENT AND HE IS AWARE OF THESE APPTS.

## 2023-04-11 ENCOUNTER — Encounter: Payer: Self-pay | Admitting: Urology

## 2023-04-11 ENCOUNTER — Other Ambulatory Visit: Payer: Self-pay

## 2023-04-11 ENCOUNTER — Ambulatory Visit
Admission: RE | Admit: 2023-04-11 | Discharge: 2023-04-11 | Disposition: A | Discharge status: Home / Self Care | Payer: Medicare Other | Source: Ambulatory Visit | Attending: Urology | Admitting: Urology

## 2023-04-11 ENCOUNTER — Ambulatory Visit
Admission: RE | Admit: 2023-04-11 | Discharge: 2023-04-11 | Disposition: A | Discharge status: Home / Self Care | Payer: Medicare Other | Source: Ambulatory Visit | Attending: Radiation Oncology | Admitting: Radiation Oncology

## 2023-04-11 VITALS — BP 144/75 | HR 72 | Temp 97.2°F | Resp 20 | Ht 71.0 in | Wt 185.0 lb

## 2023-04-11 DIAGNOSIS — Z7982 Long term (current) use of aspirin: Secondary | ICD-10-CM | POA: Insufficient documentation

## 2023-04-11 DIAGNOSIS — Z79899 Other long term (current) drug therapy: Secondary | ICD-10-CM | POA: Insufficient documentation

## 2023-04-11 DIAGNOSIS — Z923 Personal history of irradiation: Secondary | ICD-10-CM | POA: Insufficient documentation

## 2023-04-11 DIAGNOSIS — C61 Malignant neoplasm of prostate: Secondary | ICD-10-CM | POA: Insufficient documentation

## 2023-04-11 NOTE — Progress Notes (Signed)
Radiation Oncology         [phone] ________________________________  Name: Troy Petty MRN: [id number]  Date: 04/11/2023  DOB: Feb 18, 1951  Post-Seed Follow-Up Visit Note  CC: Joaquin Courts, DO  Jerilee Field, MD  Diagnosis:   72 y.o. gentleman with Stage T1c adenocarcinoma of the prostate with Gleason score of 4+3, and PSA of 7.82.     ICD-10-CM   1. Malignant neoplasm of prostate (HCC)  C61       Interval Since Last Radiation:  3 weeks 03/20/23:  Insertion of radioactive I-125 seeds into the prostate gland; 145 Gy, definitive therapy with placement of SpaceOAR gel.  Narrative:  The patient returns today for routine follow-up.  He is complaining of increased urinary frequency and urinary hesitation symptoms. He filled out a questionnaire regarding urinary function today providing and overall IPSS score of 17 characterizing his symptoms as moderate with weaker flow of stream, intermittency, frequency and nocturia 2-5x/night. He reports that the LUTS are gradually improving and tolerable at this point so he is not interested in any medications for management of symptoms.  His pre-implant score was 6. He specifically denies gross hematuria, dysuria, flank pain, fever, chills, N/V/D or night sweats. He denies any abdominal pain or bowel symptoms. He reports a healthy appetite and is maintaining his weight. Overall, he is pleased with his progress to date.  ALLERGIES:  has No Known Allergies.  Meds: Current Outpatient Medications  Medication Sig Dispense Refill   acetaminophen (TYLENOL) 500 MG tablet Take 500 mg by mouth every 6 (six) hours as needed for headache or moderate pain.     albuterol (VENTOLIN HFA) 108 (90 Base) MCG/ACT inhaler Inhale 2 puffs into the lungs every 6 (six) hours as needed for wheezing or shortness of breath.     amLODipine (NORVASC) 10 MG tablet TAKE 1 TABLET BY MOUTH EVERY DAY 90 tablet 1   aspirin 81 MG EC tablet Take 81 mg by mouth daily.      Blood Glucose Monitoring Suppl (ONE TOUCH ULTRA 2) w/Device KIT USE AS DIRECTED TO TEST GLUCOSE ONCE DAILY DX E11.65     carvedilol (COREG) 3.125 MG tablet TAKE 1 TABLET BY MOUTH 2 TIMES DAILY. *STOP METOPROLOL* 180 tablet 2   glucose blood test strip use as directed to test once daily Dx E11.65     JARDIANCE 25 MG TABS tablet Take 25 mg by mouth daily.     losartan (COZAAR) 50 MG tablet Take 50 mg by mouth daily.     Magnesium 200 MG TABS Take 400 mg by mouth. 2 x week     omeprazole (PRILOSEC) 40 MG capsule Take 1 capsule by mouth daily as needed.      OneTouch Delica Lancets 30G MISC USE AS DIRECTED TO TEST GLUCOSE ONCE DAILY DX E11.65     spironolactone (ALDACTONE) 25 MG tablet Take 0.5 tablets (12.5 mg total) by mouth daily. 45 tablet 3   Vitamin D, Ergocalciferol, (DRISDOL) 1.25 MG (50000 UNIT) CAPS capsule Take 50,000 Units by mouth every Wednesday. (Patient not taking: Reported on 03/16/2023)     No current facility-administered medications for this visit.    Physical Findings: In general this is a well appearing African American male in no acute distress. He's alert and oriented x4 and appropriate throughout the examination. Cardiopulmonary assessment is negative for acute distress and he exhibits normal effort.   Lab Findings: Lab Results  Component Value Date   WBC 8.0 08/04/2022   HGB 17.0  03/20/2023   HCT 50.0 03/20/2023   MCV 86 08/04/2022   PLT 362 04/21/2016    Radiographic Findings:  Patient underwent CT imaging in our clinic for post implant dosimetry. The CT will be reviewed by Dr. Kathrynn Running to confirm there is an adequate distribution of radioactive seeds throughout the prostate gland and ensure that there are no seeds in or near the rectum.  We suspect the final radiation plan and dosimetry will show appropriate coverage of the prostate gland. He understands that we will call and inform him of any unexpected findings on further review of his imaging and  dosimetry.  Impression/Plan: 72 y.o. gentleman with Stage T1c adenocarcinoma of the prostate with Gleason score of 4+3, and PSA of 7.82.  The patient is recovering from the effects of radiation. His urinary symptoms should gradually improve over the next 4-6 months. We talked about this today. He is encouraged by his improvement already and is otherwise pleased with his outcome. We also talked about long-term follow-up for prostate cancer following seed implant. He understands that ongoing PSA determinations and digital rectal exams will help perform surveillance to rule out disease recurrence. He had a scheduled follow up visit with Anne Fu, NP on 04/03/23 and is scheduled for his first post-treatment PSA on 07/05/23, prior to his appointment with Dr. Annabell Howells on 07/12/23. He understands what to expect with his PSA measures. Patient was also educated today about some of the long-term effects from radiation including a small risk for rectal bleeding and possibly erectile dysfunction. We talked about some of the general management approaches to these potential complications. However, I did encourage the patient to contact our office or return at any point if he has questions or concerns related to his previous radiation and prostate cancer.    Marguarite Arbour, PA-C

## 2023-04-11 NOTE — Progress Notes (Signed)
  Radiation Oncology         [phone]) [phone] ________________________________  Name: Troy Petty MRN: [id number]  Date: 04/11/2023  DOB: [birth year]  COMPLEX SIMULATION NOTE  NARRATIVE:  The patient was brought to the CT Simulation planning suite today following prostate seed implantation approximately one month ago.  Identity was confirmed.  All relevant records and images related to the planned course of therapy were reviewed.  Then, the patient was set-up supine.  CT images were obtained.  The CT images were loaded into the planning software.  Then the prostate and rectum were contoured.  Treatment planning then occurred.  The implanted iodine 125 seeds were identified by the physics staff for projection of radiation distribution  I have requested : 3D Simulation  I have requested a DVH of the following structures: Prostate and rectum.    ________________________________  Artist Pais Kathrynn Running, M.D.

## 2023-04-11 NOTE — Progress Notes (Signed)
Post-seed nursing interview for a diagnosis of Stage T1c adenocarcinoma of the prostate with Gleason score of 4+3, and PSA of 7.82.  Patient identity verified x2.   Patient reports dysuria 2/10. Patient denies all other related issues at this time.  Meaningful use complete.  I-PSS score- 17 - Moderate SHIM score- 3- No sexual activity within the last 6 months. Urinary Management medication(s) None Urology appointment date- 06/2023 with Dr. Mena Goes at [facility]  Vitals- BP (!) 144/75 (BP Location: Right Arm, Patient Position: Sitting, Cuff Size: Normal)   Pulse 72   Temp (!) 97.2 F (36.2 C) (Temporal)   Resp 20   Ht 5\' 11"  (1.803 m)   Wt 185 lb (83.9 kg)   SpO2 95%   BMI 25.80 kg/m   This concludes the interaction.  [name], LPN

## 2023-04-17 ENCOUNTER — Encounter: Payer: Self-pay | Admitting: Radiation Oncology

## 2023-04-17 DIAGNOSIS — C61 Malignant neoplasm of prostate: Secondary | ICD-10-CM | POA: Diagnosis not present

## 2023-04-17 NOTE — Radiation Completion Notes (Signed)
Patient Name: DRAGON, STAGG MRN: [id number] Date of Birth: [birth year] Referring Physician: Jerilee Field, M.D. Date of Service: 2023-04-17 Radiation Oncologist: Margaretmary Bayley, M.D. [facility] [place]                             RADIATION ONCOLOGY END OF TREATMENT NOTE     Diagnosis: C61 Malignant neoplasm of prostate Staging on 2022-12-15: Malignant neoplasm of prostate (HCC) T=cT1c, N=cN0, M=cM0 Intent: Curative     ==========DELIVERED PLANS==========  Prostate Seed Implant Date: 2023-03-20   Plan Name: Prostate Seed Implant Site: Prostate Technique: Radioactive Seed Implant I-125 Mode: Brachytherapy Dose Per Fraction: 145 Gy Prescribed Dose (Delivered / Prescribed): 145 Gy / 145 Gy Prescribed Fxs (Delivered / Prescribed): 1 / 1     ==========ON TREATMENT VISIT DATES========== 2023-03-20     ==========UPCOMING VISITS========== 05/24/2023 [facility] OFFICE HOME REMOTE PACER [facility] DEVICE REMOTES

## 2023-04-17 NOTE — Progress Notes (Signed)
  Radiation Oncology         [phone] ________________________________  Name: Troy Petty MRN: [id number]  Date: 04/17/2023  DOB: [birth year]  3D Planning Note   Prostate Brachytherapy Post-Implant Dosimetry  Diagnosis: 72 y.o. gentleman with Stage T1c adenocarcinoma of the prostate with Gleason score of 4+3, and PSA of 7.82.   Narrative: On a previous date, Troy Petty returned following prostate seed implantation for post implant planning. He underwent CT scan complex simulation to delineate the three-dimensional structures of the pelvis and demonstrate the radiation distribution.  Since that time, the seed localization, and complex isodose planning with dose volume histograms have now been completed.  Results:   Prostate Coverage - The dose of radiation delivered to the 90% or more of the prostate gland (D90) was 102.94% of the prescription dose. This exceeds our goal of greater than 90%. Rectal Sparing - The volume of rectal tissue receiving the prescription dose or higher was 0.01 cc. This falls under our thresholds tolerance of 1.0 cc.  Impression: The prostate seed implant appears to show adequate target coverage and appropriate rectal sparing.  Plan:  The patient will continue to follow with urology for ongoing PSA determinations. I would anticipate a high likelihood for local tumor control with minimal risk for rectal morbidity.  ________________________________  Artist Pais Kathrynn Running, M.D.

## 2023-04-23 ENCOUNTER — Encounter: Payer: Self-pay | Admitting: *Deleted

## 2023-05-03 ENCOUNTER — Ambulatory Visit: Payer: Medicare Other | Admitting: Radiation Oncology

## 2023-05-23 ENCOUNTER — Ambulatory Visit: Payer: Medicare Other | Admitting: Cardiology

## 2023-05-24 ENCOUNTER — Ambulatory Visit (INDEPENDENT_AMBULATORY_CARE_PROVIDER_SITE_OTHER): Payer: Medicare Other

## 2023-05-24 DIAGNOSIS — I442 Atrioventricular block, complete: Secondary | ICD-10-CM

## 2023-05-24 LAB — CUP PACEART REMOTE DEVICE CHECK
Battery Remaining Longevity: 66 mo
Battery Remaining Percentage: 88 %
Battery Voltage: 2.98 V
Brady Statistic AP VP Percent: 6.7 %
Brady Statistic AP VS Percent: 1 %
Brady Statistic AS VP Percent: 93 %
Brady Statistic AS VS Percent: 1 %
Brady Statistic RA Percent Paced: 6.1 %
Date Time Interrogation Session: 20250123020010
Implantable Lead Connection Status: 753985
Implantable Lead Connection Status: 753985
Implantable Lead Connection Status: 753985
Implantable Lead Implant Date: 20141114
Implantable Lead Implant Date: 20141114
Implantable Lead Implant Date: 20240422
Implantable Lead Location: 753858
Implantable Lead Location: 753859
Implantable Lead Location: 753860
Implantable Pulse Generator Implant Date: 20240422
Lead Channel Impedance Value: 1000 Ohm
Lead Channel Impedance Value: 350 Ohm
Lead Channel Impedance Value: 580 Ohm
Lead Channel Pacing Threshold Amplitude: 0.5 V
Lead Channel Pacing Threshold Amplitude: 0.5 V
Lead Channel Pacing Threshold Amplitude: 1 V
Lead Channel Pacing Threshold Pulse Width: 0.5 ms
Lead Channel Pacing Threshold Pulse Width: 0.5 ms
Lead Channel Pacing Threshold Pulse Width: 0.5 ms
Lead Channel Sensing Intrinsic Amplitude: 12 mV
Lead Channel Sensing Intrinsic Amplitude: 4.4 mV
Lead Channel Setting Pacing Amplitude: 2.5 V
Lead Channel Setting Pacing Amplitude: 2.5 V
Lead Channel Setting Pacing Amplitude: 3.5 V
Lead Channel Setting Pacing Pulse Width: 0.5 ms
Lead Channel Setting Pacing Pulse Width: 0.5 ms
Lead Channel Setting Sensing Sensitivity: 4 mV
Pulse Gen Model: 3562
Pulse Gen Serial Number: 8126985

## 2023-05-25 ENCOUNTER — Encounter: Payer: Self-pay | Admitting: *Deleted

## 2023-05-28 ENCOUNTER — Inpatient Hospital Stay: Payer: Medicare Other | Attending: Adult Health | Admitting: *Deleted

## 2023-05-28 ENCOUNTER — Encounter: Payer: Self-pay | Admitting: *Deleted

## 2023-05-28 DIAGNOSIS — C61 Malignant neoplasm of prostate: Secondary | ICD-10-CM

## 2023-05-28 NOTE — Progress Notes (Signed)
Mailed a copy of chair exercises to patient.

## 2023-05-28 NOTE — Progress Notes (Signed)
SCP reviewed and completed. Pt will have post-tx PSA labs in March at [facility]. Last colonoscopy was 03/2016. Repeat colonoscopy was missed in 2022. I have placed referral for colonoscopy today. Vaccination were UTD.

## 2023-06-13 ENCOUNTER — Ambulatory Visit: Payer: Medicare Other

## 2023-07-04 NOTE — Progress Notes (Signed)
 Remote pacemaker transmission.

## 2023-08-17 ENCOUNTER — Encounter: Payer: Self-pay | Admitting: Cardiology

## 2023-08-17 ENCOUNTER — Ambulatory Visit: Payer: Medicare Other | Attending: Cardiology | Admitting: Cardiology

## 2023-08-17 VITALS — BP 126/74 | HR 68 | Ht 71.0 in | Wt 193.0 lb

## 2023-08-17 DIAGNOSIS — I442 Atrioventricular block, complete: Secondary | ICD-10-CM | POA: Diagnosis present

## 2023-08-17 DIAGNOSIS — I5022 Chronic systolic (congestive) heart failure: Secondary | ICD-10-CM | POA: Insufficient documentation

## 2023-08-17 DIAGNOSIS — I10 Essential (primary) hypertension: Secondary | ICD-10-CM | POA: Insufficient documentation

## 2023-08-17 NOTE — Patient Instructions (Signed)
 Medication Instructions:  Your physician recommends that you continue on your current medications as directed. Please refer to the Current Medication list given to you today.  *If you need a refill on your cardiac medications before your next appointment, please call your pharmacy*  Lab Work: None If you have labs (blood work) drawn today and your tests are completely normal, you will receive your results only by: MyChart Message (if you have MyChart) OR A paper copy in the mail If you have any lab test that is abnormal or we need to change your treatment, we will call you to review the results.  Testing/Procedures: None  Follow-Up: At [facility], you and your health needs are our priority.  As part of our continuing mission to provide you with exceptional heart care, our providers are all part of one team.  This team includes your primary Cardiologist (physician) and Advanced Practice Providers or APPs (Physician Assistants and Nurse Practitioners) who all work together to provide you with the care you need, when you need it.  Your next appointment:   6 month(s)  Provider:   You may see Dina Rich, MD or the following Advanced Practice Provider on your designated Care Team:   Sharlene Dory, NP    We recommend signing up for the patient portal called "MyChart".  Sign up information is provided on this After Visit Summary.  MyChart is used to connect with patients for Virtual Visits (Telemedicine).  Patients are able to view lab/test results, encounter notes, upcoming appointments, etc.  Non-urgent messages can be sent to your provider as well.   To learn more about what you can do with MyChart, go to [email or web address].   Other Instructions

## 2023-08-17 NOTE — Progress Notes (Signed)
 Clinical Summary [name] is a 73 y.o.male seen today for follow up of the following medical problems.    1. History of cardiomyopathy/ Low normal to mildly decreased LV systolic function   - echo 09/2013 LVEF 45-50%, grade I diastolic dysfunction. Apex appears to be hypokinetic though poor visualization   - prior cath in [facility] 10/2012 with only mid LAD 20% lesion, otherwise patent vessels.   - repeat echo 10/2014 with LVEF 60-65%, no WMAs. - Toprol  decreased during EP visit due to mild SOB. Breathing improved lower dose Toprol .     Dec 2023 echo: LVEF 45%, grade I DD - some SOB/DOE with walking inclines, does ok on flat ground. Some improvement when he was more active.  - compliant with meds  - chronic stable SOB. No recent edema - compliant with meds     2. Non-obstructive CAD   - mild LAD lesion from cath 2014   - denies any chest pains       3. Complete heart block   - 07/2022 upgraded to BiV pacemaker by Dr Arlester Ladd 01/2023 device check normal device check   Jan 2025 normal device function         4. HTN   - reports neprhologist  started losartan 50mg  daily - home bp's typically 130s/70s     5. AAA screen - 01/2017 no AAA     6. CKD - followed by [facility] - baseline Cr 1.7   7. COPD - PFTs severe obstruction in 2023  8. Prostate cancer - followed by urology and oncology - receiving brachytherapy Past Medical History:  Diagnosis Date   Arthritis    MIDDLE FINGER AND RING FINGER BOTH HANDS   Chronic kidney disease, stage III (moderate) (HCC)    Chronic renal insufficiency    baseline creatinine is 1.8   Chronic systolic dysfunction of left ventricle    EF 40-45% in setting of PMT on limited echo   Complete heart block (HCC)    COPD (chronic obstructive pulmonary disease) (HCC)    Coronary artery disease    NON OBSTRUCTIVE CAD; DR. Amanda Jungling WITH [facility] IS PT'S CARDIOLOGIST   Diabetes mellitus without complication (HCC)  type 2    Elevated PSA    GERD (gastroesophageal reflux disease)    Hemorrhoids    Hypertension    Metabolic syndrome    Pacemaker    IMPLANTED Mar 14, 2013 - DR. ALLRED WITH [facility] FOLLOWS DEVICE FUNCTIONING   Pain    BACK PAIN AT TIMES   Shortness of breath    WITH EXERTION   Sleep apnea    uses cpap and oxygen set on 2 ( only uses oxygen 2 liters at hs with cpap)   Vitamin B12 deficiency      No Known Allergies   Current Outpatient Medications  Medication Sig Dispense Refill   acetaminophen  (TYLENOL ) 500 MG tablet Take 500 mg by mouth every 6 (six) hours as needed for headache or moderate pain.     albuterol  (VENTOLIN  HFA) 108 (90 Base) MCG/ACT inhaler Inhale 2 puffs into the lungs every 6 (six) hours as needed for wheezing or shortness of breath. (Patient not taking: Reported on 05/28/2023)     amLODipine  (NORVASC ) 10 MG tablet TAKE 1 TABLET BY MOUTH EVERY DAY 90 tablet 1   aspirin  81 MG EC tablet Take 81 mg by mouth daily.     Blood Glucose Monitoring Suppl (ONE TOUCH ULTRA  2) w/Device KIT USE AS DIRECTED TO TEST GLUCOSE ONCE DAILY DX E11.65     carvedilol  (COREG ) 3.125 MG tablet TAKE 1 TABLET BY MOUTH 2 TIMES DAILY. *STOP METOPROLOL * 180 tablet 2   glucose blood test strip use as directed to test once daily Dx E11.65     JARDIANCE 25 MG TABS tablet Take 25 mg by mouth daily.     losartan (COZAAR) 50 MG tablet Take 50 mg by mouth daily.     Magnesium 200 MG TABS Take 400 mg by mouth. 2 x week     omeprazole (PRILOSEC) 40 MG capsule Take 1 capsule by mouth daily as needed.      OneTouch Delica Lancets 30G MISC USE AS DIRECTED TO TEST GLUCOSE ONCE DAILY DX E11.65     spironolactone  (ALDACTONE ) 25 MG tablet Take 0.5 tablets (12.5 mg total) by mouth daily. 45 tablet 3   Vitamin D, Ergocalciferol, (DRISDOL) 1.25 MG (50000 UNIT) CAPS capsule Take 50,000 Units by mouth every Wednesday.     No current facility-administered medications for this visit.     Past Surgical  History:  Procedure Laterality Date   BIV UPGRADE N/A 08/21/2022   Procedure: BIV PPM UPGRADE;  Surgeon: Mealor, Donnamae Gaba, MD;  Location: [facility];  Service: Cardiovascular;  Laterality: N/A;   CARDIAC CATHETERIZATION  2014   COLON SURGERY     COLON RESECTION FOR BLOCKAGE   COLONOSCOPY N/A 04/12/2016   Dr. Riley Cheadle: 10 mm polyp, two 4 mm polyps at transverse, tubulovillous adenoma, surveillance in 3 years.    COLONOSCOPY N/A 04/12/2016   Dr. Homero Luster: post-polypectomy bleed s/p clips at transverse colon    CYSTOSCOPY N/A 03/20/2023   Procedure: CYSTOSCOPY FLEXIBLE;  Surgeon: Christina Coyer, MD;  Location: [facility];  Service: Urology;  Laterality: N/A;  1 seed detected in bladder and removed per Dr. Derrick Fling   GREEN LIGHT LASER TURP (TRANSURETHRAL RESECTION OF PROSTATE N/A 02/13/2014   Procedure: GREEN LIGHT LASER TURP (TRANSURETHRAL RESECTION OF PROSTATE;  Surgeon: Christina Coyer, MD;  Location: [facility];  Service: Urology;  Laterality: N/A;   OTHER SURGICAL HISTORY     Intestinal blockage x2   OTHER SURGICAL HISTORY  1964   Football injury   PACEMAKER INSERTION  03/14/2013   SJM Endurity implanted by Dr Merlyn Starring in [place] for complete heart block   PROSTATE BIOPSY     RADIOACTIVE SEED IMPLANT N/A 03/20/2023   Procedure: RADIOACTIVE SEED IMPLANT/BRACHYTHERAPY IMPLANT;  Surgeon: Christina Coyer, MD;  Location: [facility];  Service: Urology;  Laterality: N/A;   SPACE OAR INSTILLATION N/A 03/20/2023   Procedure: SPACE OAR INSTILLATION;  Surgeon: Christina Coyer, MD;  Location: [facility];  Service: Urology;  Laterality: N/A;     No Known Allergies    Family History  Problem Relation Age of Onset   Cancer Father    Colon cancer Father      Social History Troy Petty reports that he quit smoking about 12 years ago. His smoking use included cigarettes. He started smoking about 56 years ago. He has a 45 pack-year  smoking history. He has never used smokeless tobacco. Troy Petty reports current alcohol use.    Physical Examination Today's Vitals   08/17/23 1122  BP: 126/74  Pulse: 68  SpO2: 94%  Weight: 193 lb (87.5 kg)  Height: 5\' 11"  (1.803 m)   Body mass index is 26.92 kg/m.  Gen: resting comfortably, no acute distress HEENT: no scleral  icterus, pupils equal round and reactive, no palptable cervical adenopathy,  CV: RRR, no m/rg, no jvd Resp: Clear to auscultation bilaterally GI: abdomen is soft, non-tender, non-distended, normal bowel sounds, no hepatosplenomegaly MSK: extremities are warm, no edema.  Skin: warm, no rash Neuro:  no focal deficits Psych: appropriate affect   Diagnostic Studies 09/2013 Echo   Study Conclusions  - Left ventricle: The cavity size was normal. Wall thickness was normal. Systolic function was mildly reduced. The estimated ejection fraction was in the range of 45% to 50%. Doppler parameters are consistent with abnormal left ventricular relaxation (grade 1 diastolic dysfunction). - Regional wall motion abnormality: Several of the views are foreshortened, however overall the apex does appear hypokinetic. Hypokinesis of the apical anterior, apical inferior, apical septal, apical lateral, and apical myocardium. Can consider echo with contrast for more definitive evaluation of wall motion. - Aortic valve: Mildly calcified annulus. Trileaflet; mildly thickened leaflets. Valve area (VTI): 3.76 cm^2. Valve area (Vmax): 3.65 cm^2. - Mitral valve: Mildly calcified annulus. Mildly thickened leaflets . 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 - Systemic veins: The IVC is small, suggesting low RA pressures and hypovolemia.    10/2012 Cath [place]   RHC: RA 5, PA 40/12 (mean not reported) PCWP 12, PA sat 75%.   LHC: LM patent, LAD mid 20%, LCX patent, RCA anomolous origine from left cusp patent. LVEF 70%.     10/2014 echo Study  Conclusions   - Left ventricle: The cavity size was normal. Wall thickness was   increased in a pattern of mild LVH. Systolic function was normal.   The estimated ejection fraction was in the range of 60% to 65%.   Wall motion was normal; there were no regional wall motion   abnormalities. Doppler parameters are consistent with abnormal   left ventricular relaxation (grade 1 diastolic dysfunction). - Aortic valve: Mildly calcified annulus. Trileaflet; mildly   thickened leaflets. Valve area (VTI): 4.75 cm^2. Valve area   (Vmax): 3.73 cm^2. Valve area (Vmean): 3.54 cm^2. - Mitral valve: Mildly calcified annulus. Mildly thickened leaflets   . - Left atrium: The atrium was mildly dilated. - Atrial septum: No defect or patent foramen ovale was identified. - Pulmonary arteries: Systolic pressure was moderately increased.   PA peak pressure: 44 mm Hg (S). - Technically adequate study.   Jan 2023 echo IMPRESSIONS     1. Left ventricular ejection fraction, by estimation, is 45%. The left  ventricle has mildly decreased function. Left ventricular endocardial  border not optimally defined to evaluate regional wall motion. Left  ventricular diastolic parameters are  consistent with Grade I diastolic dysfunction (impaired relaxation).   2. Right ventricular systolic function is normal. The right ventricular  size is normal. There is normal pulmonary artery systolic pressure.   3. The mitral valve is normal in structure. No evidence of mitral valve  regurgitation. No evidence of mitral stenosis.   4. The tricuspid valve is abnormal.   5. The aortic valve has an indeterminant number of cusps. There is mild  calcification of the aortic valve. There is mild thickening of the aortic  valve. Aortic valve regurgitation is not visualized. No aortic stenosis is  present.   6. The inferior vena cava is normal in size with greater than 50%  respiratory variability, suggesting right atrial pressure of  3 mmHg.      03/2022 echo IMPRESSIONS     1. Left ventricular ejection fraction, by estimation, is 45%. The  left  ventricle has mildly decreased function. The left ventricle demonstrates  regional wall motion abnormalities (see scoring diagram/findings for  description). Left ventricular diastolic   parameters are consistent with Grade I diastolic dysfunction (impaired  relaxation).   2. Right ventricular systolic function is normal. The right ventricular  size is normal.   3. The mitral valve is normal in structure. Trivial mitral valve  regurgitation. No evidence of mitral stenosis.   4. The aortic valve is tricuspid. Aortic valve regurgitation is not  visualized. No aortic stenosis is present.   5. The inferior vena cava is normal in size with greater than 50%  respiratory variability, suggesting right atrial pressure of 3 mmHg.    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 I worry about his CKD and changing to entresto - overall doing well, he is euvolemic today - continue current meds - request labs from pcp and neprhology   2. HTN -bp is at goal, continue current meds   3. Complete heart block -normal device check recently, repeat pending for next week - continue to monitor   F/u 6 months      Laurann Pollock, M.D.

## 2023-08-23 ENCOUNTER — Ambulatory Visit (INDEPENDENT_AMBULATORY_CARE_PROVIDER_SITE_OTHER): Payer: Medicare Other

## 2023-08-23 DIAGNOSIS — I442 Atrioventricular block, complete: Secondary | ICD-10-CM | POA: Diagnosis not present

## 2023-08-23 LAB — CUP PACEART REMOTE DEVICE CHECK
Battery Remaining Longevity: 62 mo
Battery Remaining Percentage: 85 %
Battery Voltage: 2.98 V
Brady Statistic AP VP Percent: 5.4 %
Brady Statistic AP VS Percent: 1 %
Brady Statistic AS VP Percent: 95 %
Brady Statistic AS VS Percent: 1 %
Brady Statistic RA Percent Paced: 4.9 %
Date Time Interrogation Session: 20250424020022
Implantable Lead Connection Status: 753985
Implantable Lead Connection Status: 753985
Implantable Lead Connection Status: 753985
Implantable Lead Implant Date: 20141114
Implantable Lead Implant Date: 20141114
Implantable Lead Implant Date: 20240422
Implantable Lead Location: 753858
Implantable Lead Location: 753859
Implantable Lead Location: 753860
Implantable Pulse Generator Implant Date: 20240422
Lead Channel Impedance Value: 360 Ohm
Lead Channel Impedance Value: 580 Ohm
Lead Channel Impedance Value: 940 Ohm
Lead Channel Pacing Threshold Amplitude: 0.5 V
Lead Channel Pacing Threshold Amplitude: 0.5 V
Lead Channel Pacing Threshold Amplitude: 1 V
Lead Channel Pacing Threshold Pulse Width: 0.5 ms
Lead Channel Pacing Threshold Pulse Width: 0.5 ms
Lead Channel Pacing Threshold Pulse Width: 0.5 ms
Lead Channel Sensing Intrinsic Amplitude: 5 mV
Lead Channel Sensing Intrinsic Amplitude: 9.6 mV
Lead Channel Setting Pacing Amplitude: 2.5 V
Lead Channel Setting Pacing Amplitude: 2.5 V
Lead Channel Setting Pacing Amplitude: 3.5 V
Lead Channel Setting Pacing Pulse Width: 0.5 ms
Lead Channel Setting Pacing Pulse Width: 0.5 ms
Lead Channel Setting Sensing Sensitivity: 4 mV
Pulse Gen Model: 3562
Pulse Gen Serial Number: 8126985

## 2023-10-02 NOTE — Progress Notes (Signed)
 Remote pacemaker transmission.

## 2023-10-29 NOTE — Progress Notes (Unsigned)
 Cardiology Office Note    Date:  11/01/2023  ID:  Drew Lips, DOB Sep 12, 1950, MRN [id number] PCP:  Levander Norleen Dover, DO  Cardiologist:  Alvan Carrier, MD  Electrophysiologist:  Eulas FORBES Furbish, MD   Chief Complaint: Low blood pressure   History of Present Illness: .    Keanon Bevins is a 73 y.o. male with visit-pertinent history of hypertension, cardiomyopathy, nonobstructive CAD, complete heart block s/p BiV pacemaker in 07/2022 by Dr. Furbish, hypertension, CKD, COPD, prostate cancer.  Patient has been followed by Dr. Alvan and Dr. Furbish. Patient had prior cardiac catheterization in [place]  in 10/2012 with only mild LAD 20% lesion, otherwise patent vessels.  Echocardiogram in 09/2013 indicated LVEF 45 to 50%, G1 DD, Apex appeared to be hypokinetic though noted to have poor visualization.  Repeat echo in 10/2014 with LVEF 60 to 65%, no WMA's.  Echo in 2023 indicated LVEF 45, G1 DD.  TTE in 03/2022 showed persistently reduced EF at 45%.  Patient has been followed by Dr. Mealor for history of complete heart block, patient noted persistent fatigue in 03/2022 and underwent upgrade to a CRT device in April 2024.   Patient was last seen in clinic by Dr. Alvan on 08/17/2023.  Patient noted chronic stable shortness of breath, reported his nephrologist started him on losartan 50 mg daily.  Today patient presents regarding low blood pressures.  Patient reports that he has overall been doing well, denies any chest pain, shortness of breath, lower extremity edema, orthopnea or PND.  Patient reports that in the last few weeks his blood pressure has been running low, he has intermittently had to hold his amlodipine  as he is concerned it would drop too low.  Patient's blood pressure today 110/80, he reports that he did not take his amlodipine  at last night.  Patient denies any dizziness, lightheadedness, presyncope or syncope.  Patient reports that when his blood pressure is on  the soft side he overall feels well.  ROS: .   Today he denies chest pain, shortness of breath, lower extremity edema, fatigue, palpitations, melena, hematuria, hemoptysis, diaphoresis, weakness, presyncope, syncope, orthopnea, and PND.  All other systems are reviewed and otherwise negative. Studies Reviewed: SABRA   EKG:  EKG is ordered today, personally reviewed, demonstrating  EKG Interpretation Date/Time:  Wednesday October 31 2023 14:38:23 EDT Ventricular Rate:  63 PR Interval:  166 QRS Duration:  164 QT Interval:  440 QTC Calculation: 450 R Axis:   226  Text Interpretation: Atrial-sensed ventricular-paced rhythm Biventricular pacemaker detected When compared with ECG of 01-Dec-2022 13:53, Vent. rate has decreased BY   4 BPM Confirmed by Jaideep Pollack [phone]) on 10/31/2023 3:00:19 PM   CV Studies: Cardiac studies reviewed are outlined and summarized above. Otherwise please see EMR for full report. Cardiac Studies & Procedures   ______________________________________________________________________________________________     ECHOCARDIOGRAM  ECHOCARDIOGRAM COMPLETE 04/20/2022  Narrative ECHOCARDIOGRAM REPORT    Patient Name:   SIMUEL STEBNER Date of Exam: 04/20/2022 Medical Rec #:  [id number]         Height:       71.0 in Accession #:    [id number]        Weight:       194.4 lb Date of Birth:  02/05/1951         BSA:          2.083 m Patient Age:    71 years          BP:  143/82 mmHg Patient Gender: M                 HR:           64 bpm. Exam Location:  [facility]  Procedure: 2D Echo, Cardiac Doppler and Color Doppler  Indications:    Heart failure with reduced ejection fraction (HCC) [I50.20 (ICD-10-CM)  History:        Patient has prior history of Echocardiogram examinations, most recent 05/13/2021. CHF, Pacemaker, CKD, Arrythmias:Complete heart block, Signs/Symptoms:Hypotension; Risk Factors:Hypertension, Dyslipidemia and Former Smoker.  Sonographer:    Alan Greenhouse RDMS, RVT, RDCS Referring Phys: [id number] EULAS FORBES FURBISH   Sonographer Comments: Global longitudinal strain was attempted. IMPRESSIONS   1. Left ventricular ejection fraction, by estimation, is 45%. The left ventricle has mildly decreased function. The left ventricle demonstrates regional wall motion abnormalities (see scoring diagram/findings for description). Left ventricular diastolic parameters are consistent with Grade I diastolic dysfunction (impaired relaxation). 2. Right ventricular systolic function is normal. The right ventricular size is normal. 3. The mitral valve is normal in structure. Trivial mitral valve regurgitation. No evidence of mitral stenosis. 4. The aortic valve is tricuspid. Aortic valve regurgitation is not visualized. No aortic stenosis is present. 5. The inferior vena cava is normal in size with greater than 50% respiratory variability, suggesting right atrial pressure of 3 mmHg.  Comparison(s): Echocardiogram done 05/13/21 showed an EF of 45%.  FINDINGS Left Ventricle: Entire apex is hypokinetic. Left ventricular ejection fraction, by estimation, is 45%. The left ventricle has mildly decreased function. The left ventricle demonstrates regional wall motion abnormalities. Global longitudinal strain performed but not reported based on interpreter judgement due to suboptimal tracking. The left ventricular internal cavity size was normal in size. There is no left ventricular hypertrophy. Left ventricular diastolic parameters are consistent with Grade I diastolic dysfunction (impaired relaxation). Normal left ventricular filling pressure.  Right Ventricle: The right ventricular size is normal. Right vetricular wall thickness was not well visualized. Right ventricular systolic function is normal.  Left Atrium: Left atrial size was normal in size.  Right Atrium: Right atrial size was normal in size.  Pericardium: There is no evidence of pericardial  effusion.  Mitral Valve: The mitral valve is normal in structure. Trivial mitral valve regurgitation. No evidence of mitral valve stenosis.  Tricuspid Valve: The tricuspid valve is normal in structure. Tricuspid valve regurgitation is not demonstrated. No evidence of tricuspid stenosis.  Aortic Valve: The aortic valve is tricuspid. Aortic valve regurgitation is not visualized. No aortic stenosis is present. Aortic valve mean gradient measures 3.0 mmHg. Aortic valve peak gradient measures 5.9 mmHg. Aortic valve area, by VTI measures 2.92 cm.  Pulmonic Valve: The pulmonic valve was not well visualized. Pulmonic valve regurgitation is mild. No evidence of pulmonic stenosis.  Aorta: The aortic root is normal in size and structure.  Venous: The inferior vena cava is normal in size with greater than 50% respiratory variability, suggesting right atrial pressure of 3 mmHg.  IAS/Shunts: No atrial level shunt detected by color flow Doppler.  Additional Comments: A device lead is visualized in the right atrium and right ventricle.   LEFT VENTRICLE PLAX 2D LVIDd:         4.70 cm      Diastology LVIDs:         3.50 cm      LV e' medial:    4.79 cm/s LV PW:         1.10 cm  LV E/e' medial:  11.4 LV IVS:        0.80 cm      LV e' lateral:   4.57 cm/s LVOT diam:     2.00 cm      LV E/e' lateral: 11.9 LV SV:         68 LV SV Index:   32 LVOT Area:     3.14 cm  LV Volumes (MOD) LV vol d, MOD A2C: 112.0 ml LV vol d, MOD A4C: 93.3 ml LV vol s, MOD A2C: 53.8 ml LV vol s, MOD A4C: 49.4 ml LV SV MOD A2C:     58.2 ml LV SV MOD A4C:     93.3 ml LV SV MOD BP:      53.4 ml  RIGHT VENTRICLE RV S prime:     11.40 cm/s TAPSE (M-mode): 1.8 cm  LEFT ATRIUM             Index        RIGHT ATRIUM           Index LA diam:        2.80 cm 1.34 cm/m   RA Area:     14.20 cm LA Vol (A2C):   55.7 ml 26.74 ml/m  RA Volume:   40.00 ml  19.20 ml/m LA Vol (A4C):   44.7 ml 21.46 ml/m LA Biplane Vol: 50.3  ml 24.14 ml/m AORTIC VALVE                    PULMONIC VALVE AV Area (Vmax):    2.61 cm     PR End Diast Vel: 12.39 msec AV Area (Vmean):   2.73 cm AV Area (VTI):     2.92 cm AV Vmax:           121.70 cm/s AV Vmean:          80.887 cm/s AV VTI:            0.231 m AV Peak Grad:      5.9 mmHg AV Mean Grad:      3.0 mmHg LVOT Vmax:         101.00 cm/s LVOT Vmean:        70.200 cm/s LVOT VTI:          0.215 m LVOT/AV VTI ratio: 0.93  AORTA Ao Root diam: 3.50 cm  MITRAL VALVE               TRICUSPID VALVE MV Area (PHT): 2.55 cm    TR Peak grad:   27.5 mmHg MV Decel Time: 298 msec    TR Vmax:        262.00 cm/s MR Peak grad: 44.9 mmHg MR Vmax:      335.00 cm/s  SHUNTS MV E velocity: 54.40 cm/s  Systemic VTI:  0.22 m MV A velocity: 69.00 cm/s  Systemic Diam: 2.00 cm MV E/A ratio:  0.79  Dorn Ross MD Electronically signed by Dorn Ross MD Signature Date/Time: 04/21/2022/5:27:35 PM    Final          ______________________________________________________________________________________________       Current Reported Medications:.    Current Meds  Medication Sig   acetaminophen  (TYLENOL ) 500 MG tablet Take 500 mg by mouth every 6 (six) hours as needed for headache or moderate pain.   albuterol  (VENTOLIN  HFA) 108 (90 Base) MCG/ACT inhaler Inhale 2 puffs into the lungs every 6 (six) hours as needed for wheezing or shortness of  breath.   aspirin  81 MG EC tablet Take 81 mg by mouth daily.   Blood Glucose Monitoring Suppl (ONE TOUCH ULTRA 2) w/Device KIT USE AS DIRECTED TO TEST GLUCOSE ONCE DAILY DX E11.65   carvedilol  (COREG ) 3.125 MG tablet TAKE 1 TABLET BY MOUTH 2 TIMES DAILY. *STOP METOPROLOL *   glucose blood test strip use as directed to test once daily Dx E11.65   JARDIANCE 25 MG TABS tablet Take 25 mg by mouth daily.   losartan (COZAAR) 50 MG tablet Take 50 mg by mouth daily.   Magnesium 200 MG TABS Take 400 mg by mouth. 2 x week   omeprazole (PRILOSEC)  40 MG capsule Take 1 capsule by mouth daily as needed.    OneTouch Delica Lancets 30G MISC USE AS DIRECTED TO TEST GLUCOSE ONCE DAILY DX E11.65   spironolactone  (ALDACTONE ) 25 MG tablet Take 0.5 tablets (12.5 mg total) by mouth daily.   tamsulosin  (FLOMAX ) 0.4 MG CAPS capsule Take 0.4 mg by mouth daily after supper.   Vitamin D, Ergocalciferol, (DRISDOL) 1.25 MG (50000 UNIT) CAPS capsule Take 50,000 Units by mouth every Wednesday. (Patient taking differently: Take 50,000 Units by mouth as needed.)   [DISCONTINUED] amLODipine  (NORVASC ) 10 MG tablet TAKE 1 TABLET BY MOUTH EVERY DAY   Physical Exam:    VS:  BP 110/80   Pulse 63   Ht 5' 11.5 (1.816 m)   Wt 191 lb 12.8 oz (87 kg)   SpO2 93%   BMI 26.38 kg/m    Wt Readings from Last 3 Encounters:  10/31/23 191 lb 12.8 oz (87 kg)  08/17/23 193 lb (87.5 kg)  04/11/23 185 lb (83.9 kg)    GEN: Well nourished, well developed in no acute distress NECK: No JVD; No carotid bruits CARDIAC: RRR, no murmurs, rubs, gallops RESPIRATORY:  Clear to auscultation without rales, wheezing or rhonchi  ABDOMEN: Soft, non-tender, non-distended EXTREMITIES:  No edema; No acute deformity     Asessement and Plan:.    HTN: Blood pressure today 110/80.  Patient reports that his blood pressure has been running around this or lower at home, he has started intermittently holding his amlodipine  as he feels his blood pressure is too low.  Patient denies any dizziness, lightheadedness, presyncope or syncope.  Will discontinue amlodipine , encouraged patient to continue monitoring his blood pressure at home, discussed if it remains low to reach out to his nephrologist as they have been managing his ARB.  May need to consider reducing losartan to 25 mg daily.  Patient will notify the office if no improvements or if his blood pressure becomes consistently elevated above 130/80.  Could also consider changing carvedilol  back to metoprolol .  Reviewed ED  precautions.  Cardiomyopathy: Echo in 2015 indicated LVEF 45 to 50%, grade 1 DD, apex appeared to be hypokinetic though there was poor visualization.  Last echo on 04/20/2022 indicated LVEF 45%.  Patient denies any shortness of breath, lower extremity edema, orthopnea or PND.  He appears euvolemic well compensated on exam.  Continue carvedilol  3.125 mg twice daily, Jardiance 25 mg daily, losartan 50 mg daily and spironolactone  12.5 mg daily.  Complete heart block: S/p BiV upgrade in 07/2022 by Dr. Nancey.  Nonobstructive CAD: Noted to have mild LAD lesion on cardiac catheterization in 2014. Stable with no anginal symptoms. No indication for ischemic evaluation.  Heart healthy diet and regular cardiovascular exercise encouraged. Heart healthy diet and regular cardiovascular exercise encouraged.    CKD: Last creatinine 1.6 on 03/20/2023. Followed  by [facility]   Prostate cancer: Patient currently receiving brachytherapy, he is followed by urology and oncology.   Disposition: F/u with Dr. Alvan in three months or sooner if needed.   Signed, Kersti Scavone D Eryck Negron, NP

## 2023-10-31 ENCOUNTER — Encounter: Payer: Self-pay | Admitting: Cardiology

## 2023-10-31 ENCOUNTER — Ambulatory Visit: Attending: Cardiology | Admitting: Cardiology

## 2023-10-31 VITALS — BP 110/80 | HR 63 | Ht 71.5 in | Wt 191.8 lb

## 2023-10-31 DIAGNOSIS — I442 Atrioventricular block, complete: Secondary | ICD-10-CM | POA: Insufficient documentation

## 2023-10-31 DIAGNOSIS — Z950 Presence of cardiac pacemaker: Secondary | ICD-10-CM | POA: Insufficient documentation

## 2023-10-31 DIAGNOSIS — N183 Chronic kidney disease, stage 3 unspecified: Secondary | ICD-10-CM | POA: Insufficient documentation

## 2023-10-31 DIAGNOSIS — I10 Essential (primary) hypertension: Secondary | ICD-10-CM | POA: Diagnosis not present

## 2023-10-31 DIAGNOSIS — I5022 Chronic systolic (congestive) heart failure: Secondary | ICD-10-CM | POA: Insufficient documentation

## 2023-10-31 DIAGNOSIS — I2510 Atherosclerotic heart disease of native coronary artery without angina pectoris: Secondary | ICD-10-CM | POA: Insufficient documentation

## 2023-10-31 NOTE — Patient Instructions (Signed)
 Medication Instructions:  Stop amlodipine  Discuss with your Nephrologist to see if they would like to decrease the losartan *If you need a refill on your cardiac medications before your next appointment, please call your pharmacy*  Lab Work: No labs If you have labs (blood work) drawn today and your tests are completely normal, you will receive your results only by: MyChart Message (if you have MyChart) OR A paper copy in the mail If you have any lab test that is abnormal or we need to change your treatment, we will call you to review the results.  Testing/Procedures: No testing  Follow-Up: At [facility], you and your health needs are our priority.  As part of our continuing mission to provide you with exceptional heart care, our providers are all part of one team.  This team includes your primary Cardiologist (physician) and Advanced Practice Providers or APPs (Physician Assistants and Nurse Practitioners) who all work together to provide you with the care you need, when you need it.  Your next appointment:   3 month(s)  Provider:   Alvan Carrier, MD    We recommend signing up for the patient portal called MyChart.  Sign up information is provided on this After Visit Summary.  MyChart is used to connect with patients for Virtual Visits (Telemedicine).  Patients are able to view lab/test results, encounter notes, upcoming appointments, etc.  Non-urgent messages can be sent to your provider as well.   To learn more about what you can do with MyChart, go to [email or web address].

## 2023-11-01 ENCOUNTER — Encounter: Payer: Self-pay | Admitting: Cardiology

## 2023-11-22 ENCOUNTER — Ambulatory Visit: Payer: Medicare Other

## 2023-11-22 DIAGNOSIS — I442 Atrioventricular block, complete: Secondary | ICD-10-CM

## 2023-11-23 LAB — CUP PACEART REMOTE DEVICE CHECK
Battery Remaining Longevity: 60 mo
Battery Remaining Percentage: 81 %
Battery Voltage: 2.98 V
Brady Statistic AP VP Percent: 6.2 %
Brady Statistic AP VS Percent: 1 %
Brady Statistic AS VP Percent: 94 %
Brady Statistic AS VS Percent: 1 %
Brady Statistic RA Percent Paced: 5.8 %
Date Time Interrogation Session: 20250724020011
Implantable Lead Connection Status: 753985
Implantable Lead Connection Status: 753985
Implantable Lead Connection Status: 753985
Implantable Lead Implant Date: 20141114
Implantable Lead Implant Date: 20141114
Implantable Lead Implant Date: 20240422
Implantable Lead Location: 753858
Implantable Lead Location: 753859
Implantable Lead Location: 753860
Implantable Pulse Generator Implant Date: 20240422
Lead Channel Impedance Value: 360 Ohm
Lead Channel Impedance Value: 550 Ohm
Lead Channel Impedance Value: 960 Ohm
Lead Channel Pacing Threshold Amplitude: 0.5 V
Lead Channel Pacing Threshold Amplitude: 0.5 V
Lead Channel Pacing Threshold Amplitude: 1 V
Lead Channel Pacing Threshold Pulse Width: 0.5 ms
Lead Channel Pacing Threshold Pulse Width: 0.5 ms
Lead Channel Pacing Threshold Pulse Width: 0.5 ms
Lead Channel Sensing Intrinsic Amplitude: 12 mV
Lead Channel Sensing Intrinsic Amplitude: 5 mV
Lead Channel Setting Pacing Amplitude: 2.5 V
Lead Channel Setting Pacing Amplitude: 2.5 V
Lead Channel Setting Pacing Amplitude: 3.5 V
Lead Channel Setting Pacing Pulse Width: 0.5 ms
Lead Channel Setting Pacing Pulse Width: 0.5 ms
Lead Channel Setting Sensing Sensitivity: 4 mV
Pulse Gen Model: 3562
Pulse Gen Serial Number: 8126985

## 2023-11-25 ENCOUNTER — Ambulatory Visit: Payer: Self-pay | Admitting: Cardiovascular Disease

## 2024-01-04 ENCOUNTER — Ambulatory Visit: Payer: Medicare Other | Attending: Cardiovascular Disease | Admitting: Cardiovascular Disease

## 2024-01-04 ENCOUNTER — Encounter: Payer: Self-pay | Admitting: Cardiovascular Disease

## 2024-01-04 VITALS — BP 130/66 | HR 73 | Ht 71.0 in | Wt 186.0 lb

## 2024-01-04 DIAGNOSIS — I442 Atrioventricular block, complete: Secondary | ICD-10-CM | POA: Insufficient documentation

## 2024-01-04 DIAGNOSIS — I10 Essential (primary) hypertension: Secondary | ICD-10-CM | POA: Insufficient documentation

## 2024-01-04 DIAGNOSIS — I5022 Chronic systolic (congestive) heart failure: Secondary | ICD-10-CM | POA: Insufficient documentation

## 2024-01-04 DIAGNOSIS — I2510 Atherosclerotic heart disease of native coronary artery without angina pectoris: Secondary | ICD-10-CM | POA: Diagnosis present

## 2024-01-04 NOTE — Patient Instructions (Signed)
 Medication Instructions:   Continue all current medications.   Labwork:  none  Testing/Procedures:  Your physician has requested that you have an echocardiogram. Echocardiography is a painless test that uses sound waves to create images of your heart. It provides your doctor with information about the size and shape of your heart and how well your heart's chambers and valves are working. This procedure takes approximately one hour. There are no restrictions for this procedure. Please do NOT wear cologne, perfume, aftershave, or lotions (deodorant is allowed). Please arrive 15 minutes prior to your appointment time.  Please note: We ask at that you not bring children with you during ultrasound (echo/ vascular) testing. Due to room size and safety concerns, children are not allowed in the ultrasound rooms during exams. Our front office staff cannot provide observation of children in our lobby area while testing is being conducted. An adult accompanying a patient to their appointment will only be allowed in the ultrasound room at the discretion of the ultrasound technician under special circumstances. We apologize for any inconvenience. Office will contact with results via phone, letter or mychart.     Follow-Up:  Your physician wants you to follow up in:  1 year.  You should receive a recall letter in the mail about 2 months prior to the time you are due.  If you don't receive this, please call our office to schedule your follow up appointment.       Any Other Special Instructions Will Be Listed Below (If Applicable).   If you need a refill on your cardiac medications before your next appointment, please call your pharmacy.

## 2024-01-04 NOTE — Progress Notes (Signed)
    PCP: Petty, Troy Patrick, DO   Primary EP:  Dr Troy Petty  Troy Petty is a 73 y.o. male who presents today for routine electrophysiology followup.  Since last being seen in our clinic, the patient reports doing reasonably well.     He is not very active.  He has SOB with moderate activity.  This is chronic.   TTE performed 03/2022 that showed persistently reduced EF of 45%.  He noted fatigue with minimal exertion, even wearing a heavy coat made him tired.  He underwent upgrade to a CRT device on August 21, 2022.  QRS improved from about 190 ms to about 150 ms.      Today, he notices that his functional capacity is somewhat improved.  He went for a 2 mile walk with minimal fatigue. he has no device related complaints -- no new tenderness, drainage, redness.     Physical Exam: Vitals:   01/04/24 1130  BP: 130/66  Pulse: 73  SpO2: 91%  Weight: 186 lb (84.4 kg)  Height: 5' 11 (1.803 m)     GEN- The patient is well appearing, alert and oriented x 3 today.   Lungs- normal work of breathing Chest- pacemaker pocket is well healed Heart- Regular rate and rhythm, no murmurs, rubs or gallops Extremities- no clubbing, cyanosis, or edema  Pacemaker interrogation- reviewed in detail today,  See PACEART report       Assessment and Plan:  1. Symptomatic complete heart block Normal BiV pacemaker function, 99% BiV paced  See Pace Art report No changes today Chronic RA noise is noted he is device dependant today  2. CAD (nonobstructive) No ischemic symptoms No changes  3. HTN Stable No change required today  4. CHF moderately reduced EF: EF remains reduced at 45%. Symptomatic with NYHA II symptoms. Will recheck TTE  5. Atrial high rate episodes: lasting less than 1 minute. Continue to monitor.  6. Chronic diastolic dysfunction EF normal by echo 2016 Clinically stable Follows with Dr Troy Petty Nancey, MD 01/04/2024 11:55 AM

## 2024-01-21 ENCOUNTER — Ambulatory Visit: Attending: Internal Medicine

## 2024-01-21 DIAGNOSIS — I5022 Chronic systolic (congestive) heart failure: Secondary | ICD-10-CM | POA: Insufficient documentation

## 2024-01-21 LAB — ECHOCARDIOGRAM COMPLETE
AR max vel: 2.72 cm2
AV Peak grad: 8.4 mmHg
Ao pk vel: 1.45 m/s
Area-P 1/2: 2.23 cm2
Calc EF: 60.3 %
S' Lateral: 3.2 cm
Single Plane A2C EF: 64.7 %
Single Plane A4C EF: 56.9 %

## 2024-01-30 ENCOUNTER — Ambulatory Visit: Payer: Self-pay | Admitting: Internal Medicine

## 2024-01-31 ENCOUNTER — Encounter: Payer: Self-pay | Admitting: Cardiology

## 2024-01-31 ENCOUNTER — Ambulatory Visit: Attending: Cardiology | Admitting: Cardiology

## 2024-01-31 VITALS — BP 112/58 | HR 75 | Ht 71.0 in | Wt 190.4 lb

## 2024-01-31 DIAGNOSIS — I5020 Unspecified systolic (congestive) heart failure: Secondary | ICD-10-CM | POA: Insufficient documentation

## 2024-01-31 DIAGNOSIS — R0602 Shortness of breath: Secondary | ICD-10-CM | POA: Insufficient documentation

## 2024-01-31 DIAGNOSIS — I442 Atrioventricular block, complete: Secondary | ICD-10-CM | POA: Diagnosis not present

## 2024-01-31 DIAGNOSIS — I10 Essential (primary) hypertension: Secondary | ICD-10-CM | POA: Diagnosis present

## 2024-01-31 MED ORDER — AMLODIPINE BESYLATE 5 MG PO TABS: 5.0000 mg | ORAL_TABLET | Freq: Every day | ORAL | 3 refills | Status: AC

## 2024-01-31 NOTE — Progress Notes (Signed)
 Remote PPM Transmission

## 2024-01-31 NOTE — Patient Instructions (Signed)
 Medication Instructions:   Decrease Amlodipine  to 5mg  daily Continue all other medications.     Labwork:  none  Testing/Procedures:  none  Follow-Up:  6 months   Any Other Special Instructions Will Be Listed Below (If Applicable).  You have been referred to:  Pulmonary   If you need a refill on your cardiac medications before your next appointment, please call your pharmacy.

## 2024-01-31 NOTE — Progress Notes (Signed)
 Clinical Summary Troy Petty is a 73 y.o.male seen today for follow up of the following medical problems.    1. History of cardiomyopathy/ Low normal to mildly decreased LV systolic function   - echo 09/2013 LVEF 45-50%, grade I diastolic dysfunction. Apex appears to be hypokinetic though poor visualization   - prior cath in [facility] 10/2012 with only mid LAD 20% lesion, otherwise patent vessels.   - repeat echo 10/2014 with LVEF 60-65%, no WMAs. - Toprol  decreased during EP visit due to mild SOB. Breathing improved lower dose Toprol .     Dec 2023 echo: LVEF 45%, grade I DD - 07/2022 upgraded to BiV pacemaker by Dr Nancey   12/2023 echo: LVEF 60-65%, no WMAs, grade I dd, normal RV function,  - chronic SOB/DOE, PFTs severe obstructon in 2023 only on prn albuterol , has not seen pulmonary      2. Non-obstructive CAD   - mild LAD lesion from cath 2014   - no chest pains.        3. Complete heart block   - 07/2022 upgraded to BiV pacemaker by Dr Nancey 01/2023 device check normal device check   12/2023 normal device check  - no symptoms       4. HTN   - reports neprhologist  started losartan 50mg  daily - home bp's typically 108-130s/80s  10/31/23 visit with NP Chad norvasc  stopped due to low bp's After stopping had high bp's, he starting taking the norvasc  5mg  daily with normal bp's since.       5. AAA screen - 01/2017 no AAA     6. CKD - followed by [facility]    7. COPD - PFTs severe obstruction in 2023 - ongoing SOB/DOE, has never seen pulmonary   8. Prostate cancer - followed by urology and oncology - receiving brachytherapy Past Medical History:  Diagnosis Date   Arthritis    MIDDLE FINGER AND RING FINGER BOTH HANDS   Chronic kidney disease, stage III (moderate) (HCC)    Chronic renal insufficiency    baseline creatinine is 1.8   Chronic systolic dysfunction of left ventricle    EF 40-45% in setting of PMT on limited echo   Complete  heart block (HCC)    COPD (chronic obstructive pulmonary disease) (HCC)    Coronary artery disease    NON OBSTRUCTIVE CAD; DR. ALVAN WITH [facility] IS PT'S CARDIOLOGIST   Diabetes mellitus without complication (HCC) type 2    Elevated PSA    GERD (gastroesophageal reflux disease)    Hemorrhoids    Hypertension    Metabolic syndrome    Pacemaker    IMPLANTED Mar 14, 2013 - DR. ALLRED WITH [facility] FOLLOWS DEVICE FUNCTIONING   Pain    BACK PAIN AT TIMES   Shortness of breath    WITH EXERTION   Sleep apnea    uses cpap and oxygen set on 2 ( only uses oxygen 2 liters at hs with cpap)   Vitamin B12 deficiency      No Known Allergies   Current Outpatient Medications  Medication Sig Dispense Refill   acetaminophen  (TYLENOL ) 500 MG tablet Take 500 mg by mouth every 6 (six) hours as needed for headache or moderate pain.     albuterol  (VENTOLIN  HFA) 108 (90 Base) MCG/ACT inhaler Inhale 2 puffs into the lungs every 6 (six) hours as needed for wheezing or shortness of breath.     amLODipine  (NORVASC ) 10  MG tablet Take 10 mg by mouth daily.     aspirin  81 MG EC tablet Take 81 mg by mouth daily.     Blood Glucose Monitoring Suppl (ONE TOUCH ULTRA 2) w/Device KIT USE AS DIRECTED TO TEST GLUCOSE ONCE DAILY DX E11.65     carvedilol  (COREG ) 3.125 MG tablet TAKE 1 TABLET BY MOUTH 2 TIMES DAILY. *STOP METOPROLOL * 180 tablet 2   glucose blood test strip use as directed to test once daily Dx E11.65     JARDIANCE 25 MG TABS tablet Take 25 mg by mouth daily.     losartan (COZAAR) 50 MG tablet Take 50 mg by mouth daily.     Magnesium 200 MG TABS Take 400 mg by mouth. 2 x week (Patient not taking: Reported on 01/04/2024)     omeprazole (PRILOSEC) 40 MG capsule Take 1 capsule by mouth daily as needed.      OneTouch Delica Lancets 30G MISC USE AS DIRECTED TO TEST GLUCOSE ONCE DAILY DX E11.65     spironolactone  (ALDACTONE ) 25 MG tablet Take 0.5 tablets (12.5 mg total) by mouth daily. 45 tablet  3   tamsulosin  (FLOMAX ) 0.4 MG CAPS capsule Take 0.4 mg by mouth daily after supper.     Vitamin D, Ergocalciferol, (DRISDOL) 1.25 MG (50000 UNIT) CAPS capsule Take 50,000 Units by mouth every Wednesday. (Patient taking differently: Take 50,000 Units by mouth as needed.)     No current facility-administered medications for this visit.     Past Surgical History:  Procedure Laterality Date   BIV UPGRADE N/A 08/21/2022   Procedure: BIV PPM UPGRADE;  Surgeon: Mealor, Eulas BRAVO, MD;  [facility];  Service: Cardiovascular;  Laterality: N/A;   CARDIAC CATHETERIZATION  2014   COLON SURGERY     COLON RESECTION FOR BLOCKAGE   COLONOSCOPY N/A 04/12/2016   Dr. Shaaron: 10 mm polyp, two 4 mm polyps at transverse, tubulovillous adenoma, surveillance in 3 years.    COLONOSCOPY N/A 04/12/2016   Dr. Golda: post-polypectomy bleed s/p clips at transverse colon    CYSTOSCOPY N/A 03/20/2023   Procedure: CYSTOSCOPY FLEXIBLE;  Surgeon: Nieves Cough, MD;  Location: [facility];  Service: Urology;  Laterality: N/A;  1 seed detected in bladder and removed per Dr. Nieves   GREEN LIGHT LASER TURP (TRANSURETHRAL RESECTION OF PROSTATE N/A 02/13/2014   Procedure: GREEN LIGHT LASER TURP (TRANSURETHRAL RESECTION OF PROSTATE;  Surgeon: Cough Nieves, MD;  Location: [facility];  Service: Urology;  Laterality: N/A;   OTHER SURGICAL HISTORY     Intestinal blockage x2   OTHER SURGICAL HISTORY  1964   Football injury   PACEMAKER INSERTION  03/14/2013   SJM Endurity implanted by Dr Jama in [place] for complete heart block   PROSTATE BIOPSY     RADIOACTIVE SEED IMPLANT N/A 03/20/2023   Procedure: RADIOACTIVE SEED IMPLANT/BRACHYTHERAPY IMPLANT;  Surgeon: Nieves Cough, MD;  Location: [facility];  Service: Urology;  Laterality: N/A;   SPACE OAR INSTILLATION N/A 03/20/2023   Procedure: SPACE OAR INSTILLATION;  Surgeon: Nieves Cough, MD;  Location: [facility];  Service: Urology;  Laterality: N/A;     No Known Allergies    Family History  Problem Relation Age of Onset   Cancer Father    Colon cancer Father      Social History Troy Petty reports that he quit smoking about 12 years ago. His smoking use included cigarettes. He started smoking about 57 years ago. He  has a 45 pack-year smoking history. He has never used smokeless tobacco. Troy Petty reports current alcohol use.    Physical Examination Today's Vitals   01/31/24 1007  BP: (!) 112/58  Pulse: 75  SpO2: 93%  Weight: 190 lb 6.4 oz (86.4 kg)  Height: 5' 11 (1.803 m)   Body mass index is 26.56 kg/m.  Gen: resting comfortably, no acute distress HEENT: no scleral icterus, pupils equal round and reactive, no palptable cervical adenopathy,  CV: RRR, no m/rg, no jvd Resp: Clear to auscultation bilaterally GI: abdomen is soft, non-tender, non-distended, normal bowel sounds, no hepatosplenomegaly MSK: extremities are warm, no edema.  Skin: warm, no rash Neuro:  no focal deficits Psych: appropriate affect   Diagnostic Studies  09/2013 Echo   Study Conclusions  - Left ventricle: The cavity size was normal. Wall thickness was normal. Systolic function was mildly reduced. The estimated ejection fraction was in the range of 45% to 50%. Doppler parameters are consistent with abnormal left ventricular relaxation (grade 1 diastolic dysfunction). - Regional wall motion abnormality: Several of the views are foreshortened, however overall the apex does appear hypokinetic. Hypokinesis of the apical anterior, apical inferior, apical septal, apical lateral, and apical myocardium. Can consider echo with contrast for more definitive evaluation of wall motion. - Aortic valve: Mildly calcified annulus. Trileaflet; mildly thickened leaflets. Valve area (VTI): 3.76 cm^2. Valve area (Vmax): 3.65 cm^2. - Mitral valve: Mildly calcified annulus. Mildly thickened  leaflets . 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 - Systemic veins: The IVC is small, suggesting low RA pressures and hypovolemia.    10/2012 [facility]   RHC: RA 5, PA 40/12 (mean not reported) PCWP 12, PA sat 75%.   LHC: LM patent, LAD mid 20%, LCX patent, RCA anomolous origine from left cusp patent. LVEF 70%.     10/2014 echo Study Conclusions   - Left ventricle: The cavity size was normal. Wall thickness was   increased in a pattern of mild LVH. Systolic function was normal.   The estimated ejection fraction was in the range of 60% to 65%.   Wall motion was normal; there were no regional wall motion   abnormalities. Doppler parameters are consistent with abnormal   left ventricular relaxation (grade 1 diastolic dysfunction). - Aortic valve: Mildly calcified annulus. Trileaflet; mildly   thickened leaflets. Valve area (VTI): 4.75 cm^2. Valve area   (Vmax): 3.73 cm^2. Valve area (Vmean): 3.54 cm^2. - Mitral valve: Mildly calcified annulus. Mildly thickened leaflets   . - Left atrium: The atrium was mildly dilated. - Atrial septum: No defect or patent foramen ovale was identified. - Pulmonary arteries: Systolic pressure was moderately increased.   PA peak pressure: 44 mm Hg (S). - Technically adequate study.   Jan 2023 echo IMPRESSIONS     1. Left ventricular ejection fraction, by estimation, is 45%. The left  ventricle has mildly decreased function. Left ventricular endocardial  border not optimally defined to evaluate regional wall motion. Left  ventricular diastolic parameters are  consistent with Grade I diastolic dysfunction (impaired relaxation).   2. Right ventricular systolic function is normal. The right ventricular  size is normal. There is normal pulmonary artery systolic pressure.   3. The mitral valve is normal in structure. No evidence of mitral valve  regurgitation. No evidence of mitral stenosis.   4. The tricuspid valve is  abnormal.   5. The aortic valve has an indeterminant number of cusps. There is mild  calcification  of the aortic valve. There is mild thickening of the aortic  valve. Aortic valve regurgitation is not visualized. No aortic stenosis is  present.   6. The inferior vena cava is normal in size with greater than 50%  respiratory variability, suggesting right atrial pressure of 3 mmHg.      03/2022 echo IMPRESSIONS     1. Left ventricular ejection fraction, by estimation, is 45%. The left  ventricle has mildly decreased function. The left ventricle demonstrates  regional wall motion abnormalities (see scoring diagram/findings for  description). Left ventricular diastolic   parameters are consistent with Grade I diastolic dysfunction (impaired  relaxation).   2. Right ventricular systolic function is normal. The right ventricular  size is normal.   3. The mitral valve is normal in structure. Trivial mitral valve  regurgitation. No evidence of mitral stenosis.   4. The aortic valve is tricuspid. Aortic valve regurgitation is not  visualized. No aortic stenosis is present.   5. The inferior vena cava is normal in size with greater than 50%  respiratory variability, suggesting right atrial pressure of 3 mmHg.     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 Did not change to entresto previously due to his CKD -euvolemic, symptoms of DOE do not appear cardiac related. Severe COPD by 2023 PFTs not on therapy, refer to pulmonary     2. HTN -his bp is at goal, continue current meds   3. Complete heart block -recently normal device check at EP appt - no symptoms - continue to monitor  4. COPD - chronic SOB/DOE, abnormal PFTs 2023 with severe obstuction - refer to pulmonary         Troy Petty, M.D.

## 2024-02-05 ENCOUNTER — Encounter: Payer: Self-pay | Admitting: *Deleted

## 2024-02-13 ENCOUNTER — Encounter: Payer: Self-pay | Admitting: Cardiology

## 2024-02-21 ENCOUNTER — Ambulatory Visit: Payer: Medicare Other

## 2024-02-21 DIAGNOSIS — I442 Atrioventricular block, complete: Secondary | ICD-10-CM

## 2024-02-21 LAB — CUP PACEART REMOTE DEVICE CHECK
Battery Remaining Longevity: 70 mo
Battery Remaining Percentage: 77 %
Battery Voltage: 2.99 V
Brady Statistic AP VP Percent: 11 %
Brady Statistic AP VS Percent: 1 %
Brady Statistic AS VP Percent: 89 %
Brady Statistic AS VS Percent: 1 %
Brady Statistic RA Percent Paced: 9.9 %
Date Time Interrogation Session: 20251023020024
Implantable Lead Connection Status: 753985
Implantable Lead Connection Status: 753985
Implantable Lead Connection Status: 753985
Implantable Lead Implant Date: 20141114
Implantable Lead Implant Date: 20141114
Implantable Lead Implant Date: 20240422
Implantable Lead Location: 753858
Implantable Lead Location: 753859
Implantable Lead Location: 753860
Implantable Pulse Generator Implant Date: 20240422
Lead Channel Impedance Value: 330 Ohm
Lead Channel Impedance Value: 510 Ohm
Lead Channel Impedance Value: 960 Ohm
Lead Channel Pacing Threshold Amplitude: 0.5 V
Lead Channel Pacing Threshold Amplitude: 0.75 V
Lead Channel Pacing Threshold Amplitude: 1 V
Lead Channel Pacing Threshold Pulse Width: 0.5 ms
Lead Channel Pacing Threshold Pulse Width: 0.5 ms
Lead Channel Pacing Threshold Pulse Width: 0.5 ms
Lead Channel Sensing Intrinsic Amplitude: 11.7 mV
Lead Channel Sensing Intrinsic Amplitude: 2.4 mV
Lead Channel Setting Pacing Amplitude: 2 V
Lead Channel Setting Pacing Amplitude: 2.5 V
Lead Channel Setting Pacing Amplitude: 2.5 V
Lead Channel Setting Pacing Pulse Width: 0.5 ms
Lead Channel Setting Pacing Pulse Width: 0.5 ms
Lead Channel Setting Sensing Sensitivity: 4 mV
Pulse Gen Model: 3562
Pulse Gen Serial Number: 8126985

## 2024-02-22 NOTE — Progress Notes (Signed)
 Remote PPM Transmission

## 2024-03-05 ENCOUNTER — Ambulatory Visit: Payer: Self-pay | Admitting: Cardiovascular Disease

## 2024-03-11 ENCOUNTER — Ambulatory Visit

## 2024-03-11 NOTE — Progress Notes (Incomplete)
 New Patient Pulmonology Office Visit   Subjective:  Patient ID: Troy Petty, Troy Petty    DOB: 11/13/1950  MRN: [id number]  Referred by: Alvan Dorn FALCON, MD  CC: No chief complaint on file.   HPI Troy Petty is a 73 y.o. Troy Petty with *** COPD, CAD, CHBs/p BiVpacemaker, adenoCa of the prostate  siob  {PULM QUESTIONNAIRES (Optional):33196}  ROS  Allergies: Patient has no known allergies.  Current Outpatient Medications:    acetaminophen  (TYLENOL ) 500 MG tablet, Take 500 mg by mouth every 6 (six) hours as needed for headache or moderate pain., Disp: , Rfl:    albuterol  (VENTOLIN  HFA) 108 (90 Base) MCG/ACT inhaler, Inhale 2 puffs into the lungs every 6 (six) hours as needed for wheezing or shortness of breath., Disp: , Rfl:    amLODipine  (NORVASC ) 5 MG tablet, Take 1 tablet (5 mg total) by mouth daily., Disp: 90 tablet, Rfl: 3   aspirin  81 MG EC tablet, Take 81 mg by mouth daily., Disp: , Rfl:    Blood Glucose Monitoring Suppl (ONE TOUCH ULTRA 2) w/Device KIT, USE AS DIRECTED TO TEST GLUCOSE ONCE DAILY DX E11.65, Disp: , Rfl:    carvedilol  (COREG ) 3.125 MG tablet, TAKE 1 TABLET BY MOUTH 2 TIMES DAILY. *STOP METOPROLOL *, Disp: 180 tablet, Rfl: 2   glucose blood test strip, use as directed to test once daily Dx E11.65, Disp: , Rfl:    JARDIANCE 25 MG TABS tablet, Take 25 mg by mouth daily., Disp: , Rfl:    losartan (COZAAR) 50 MG tablet, Take 50 mg by mouth daily., Disp: , Rfl:    Magnesium 200 MG TABS, Take 400 mg by mouth. 2 x week, Disp: , Rfl:    omeprazole (PRILOSEC) 40 MG capsule, Take 1 capsule by mouth daily as needed. , Disp: , Rfl:    OneTouch Delica Lancets 30G MISC, USE AS DIRECTED TO TEST GLUCOSE ONCE DAILY DX E11.65, Disp: , Rfl:    spironolactone  (ALDACTONE ) 25 MG tablet, Take 0.5 tablets (12.5 mg total) by mouth daily., Disp: 45 tablet, Rfl: 3   tamsulosin  (FLOMAX ) 0.4 MG CAPS capsule, Take 0.4 mg by mouth daily after supper., Disp: , Rfl:    Vitamin D,  Ergocalciferol, (DRISDOL) 1.25 MG (50000 UNIT) CAPS capsule, Take 50,000 Units by mouth every Wednesday., Disp: , Rfl:  Past Medical History:  Diagnosis Date   Arthritis    MIDDLE FINGER AND RING FINGER BOTH HANDS   Chronic kidney disease, stage III (moderate) (HCC)    Chronic renal insufficiency    baseline creatinine is 1.8   Chronic systolic dysfunction of left ventricle    EF 40-45% in setting of PMT on limited echo   Complete heart block (HCC)    COPD (chronic obstructive pulmonary disease) (HCC)    Coronary artery disease    NON OBSTRUCTIVE CAD; DR. ALVAN WITH [facility] IS PT'S CARDIOLOGIST   Diabetes mellitus without complication (HCC) type 2    Elevated PSA    GERD (gastroesophageal reflux disease)    Hemorrhoids    Hypertension    Metabolic syndrome    Pacemaker    IMPLANTED Mar 14, 2013 - DR. ALLRED WITH [facility] FOLLOWS DEVICE FUNCTIONING   Pain    BACK PAIN AT TIMES   Shortness of breath    WITH EXERTION   Sleep apnea    uses cpap and oxygen set on 2 ( only uses oxygen 2 liters at hs with cpap)   Vitamin B12 deficiency  Past Surgical History:  Procedure Laterality Date   BIV UPGRADE N/A 08/21/2022   Procedure: BIV PPM UPGRADE;  Surgeon: Mealor, Eulas BRAVO, MD;  [facility];  Service: Cardiovascular;  Laterality: N/A;   CARDIAC CATHETERIZATION  2014   COLON SURGERY     COLON RESECTION FOR BLOCKAGE   COLONOSCOPY N/A 04/12/2016   Dr. Shaaron: 10 mm polyp, two 4 mm polyps at transverse, tubulovillous adenoma, surveillance in 3 years.    COLONOSCOPY N/A 04/12/2016   Dr. Golda: post-polypectomy bleed s/p clips at transverse colon    CYSTOSCOPY N/A 03/20/2023   Procedure: CYSTOSCOPY FLEXIBLE;  Surgeon: Nieves Cough, MD;  Location: [facility];  Service: Urology;  Laterality: N/A;  1 seed detected in bladder and removed per Dr. Nieves   GREEN LIGHT LASER TURP (TRANSURETHRAL RESECTION OF PROSTATE N/A 02/13/2014    Procedure: GREEN LIGHT LASER TURP (TRANSURETHRAL RESECTION OF PROSTATE;  Surgeon: Cough Nieves, MD;  Location: [facility];  Service: Urology;  Laterality: N/A;   OTHER SURGICAL HISTORY     Intestinal blockage x2   OTHER SURGICAL HISTORY  1964   Football injury   PACEMAKER INSERTION  03/14/2013   SJM Endurity implanted by Dr Jama in [place] for complete heart block   PROSTATE BIOPSY     RADIOACTIVE SEED IMPLANT N/A 03/20/2023   Procedure: RADIOACTIVE SEED IMPLANT/BRACHYTHERAPY IMPLANT;  Surgeon: Nieves Cough, MD;  Location[facility];  Service: Urology;  Laterality: N/A;   SPACE OAR INSTILLATION N/A 03/20/2023   Procedure: SPACE OAR INSTILLATION;  Surgeon: Nieves Cough, MD;  Location: [facility];  Service: Urology;  Laterality: N/A;   Family History  Problem Relation Age of Onset   Cancer Father    Colon cancer Father    Social History   Socioeconomic History   Marital status: Married    Spouse name: Not on file   Number of children: Not on file   Years of education: Not on file   Highest education level: Not on file  Occupational History   Not on file  Tobacco Use   Smoking status: Former    Current packs/day: 0.00    Average packs/day: 1 pack/day for 45.0 years (45.0 ttl pk-yrs)    Types: Cigarettes    Start date: 09/29/1966    Quit date: 05/02/2011    Years since quitting: 12.8   Smokeless tobacco: Never  Vaping Use   Vaping status: Never Used  Substance and Sexual Activity   Alcohol use: Yes    Comment: occ   Drug use: No   Sexual activity: Not on file  Other Topics Concern   Not on file  Social History Narrative   Lives in [place] with spouse.   Social Drivers of Corporate Investment Banker Strain: Not on file  Food Insecurity: No Food Insecurity (01/16/2023)   Hunger Vital Sign    Worried About Running Out of Food in the Last Year: Never true    Ran Out of Food in the Last Year: Never true  Transportation  Needs: No Transportation Needs (01/16/2023)   PRAPARE - Administrator, Civil Service (Medical): No    Lack of Transportation (Non-Medical): No  Physical Activity: Not on file  Stress: Not on file  Social Connections: Not on file  Intimate Partner Violence: Not At Risk (01/16/2023)   Humiliation, Afraid, Rape, and Kick questionnaire    Fear of Current or Ex-Partner: No    Emotionally  Abused: No    Physically Abused: No    Sexually Abused: No       Objective:  There were no vitals taken for this visit. {Pulm Vitals (Optional):32837}  Physical Exam  Diagnostic Review:  {Labs (Optional):32838} 07/2022: normal    Assessment & Plan:   Assessment & Plan    No follow-ups on file.    Marny Patch, MD Pulmonary and Critical Care Medicine [facility]

## 2024-05-06 ENCOUNTER — Ambulatory Visit (INDEPENDENT_AMBULATORY_CARE_PROVIDER_SITE_OTHER): Admitting: Pulmonary Disease

## 2024-05-06 ENCOUNTER — Encounter: Payer: Self-pay | Admitting: Pulmonary Disease

## 2024-05-06 VITALS — BP 115/75 | HR 72 | Temp 98.2°F | Ht 71.0 in | Wt 190.0 lb

## 2024-05-06 DIAGNOSIS — J439 Emphysema, unspecified: Secondary | ICD-10-CM

## 2024-05-06 DIAGNOSIS — Z87891 Personal history of nicotine dependence: Secondary | ICD-10-CM | POA: Diagnosis not present

## 2024-05-06 DIAGNOSIS — J449 Chronic obstructive pulmonary disease, unspecified: Secondary | ICD-10-CM | POA: Diagnosis not present

## 2024-05-06 MED ORDER — UMECLIDINIUM-VILANTEROL 62.5-25 MCG/ACT IN AEPB: 1.0000 | INHALATION_SPRAY | Freq: Every day | RESPIRATORY_TRACT | 11 refills | Status: AC

## 2024-05-06 NOTE — Progress Notes (Signed)
 "              Troy Petty    [id number]    11/17/1950  Primary Care Physician:Favero, Norleen Dover, DO  Referring Physician: Alvan Dorn FALCON, MD [address] [place],  [place] [address]  Chief complaint: Consult for COPD, dyspnea  HPI: 74 y.o. who  has a past medical history of Arthritis, Chronic kidney disease, stage III (moderate) (HCC), Chronic renal insufficiency, Chronic systolic dysfunction of left ventricle, Complete heart block (HCC), COPD (chronic obstructive pulmonary disease) (HCC), Coronary artery disease, Diabetes mellitus without complication (HCC) type 2, Elevated PSA, GERD (gastroesophageal reflux disease), Hemorrhoids, Hypertension, Metabolic syndrome, Pacemaker, Pain, Shortness of breath, Sleep apnea, and Vitamin B12 deficiency.   Discussed the use of AI scribe software for clinical note transcription with the patient, who gave verbal consent to proceed.  History of Present Illness Troy Petty is a 74 year old male with cardiomyopathy and complete heart block who presents with shortness of breath for evaluation of possible COPD. He is accompanied by his wife. He was referred by his cardiologist for evaluation of his lung function.  Dyspnea - Chronic shortness of breath for approximately ten years - Initial severe dyspnea improved after pacemaker placement - Recent pacemaker upgrade with additional lead provided some benefit - Persistent marked dyspnea with exertion, such as carrying a pail of water   Obstructive sleep apnea - Noncompliant with CPAP - Feels he breathes better without CPAP and does not use supplemental oxygen  Copd management - Previously diagnosed with COPD - Uses albuterol  as needed for symptom relief  Relevant Pulmonary history: Pets: No pets Occupation: Retired naval architect Exposures: No mold, hot tub, Financial Controller.  No feather pillow comforter No h/o chemo/XRT/amiodarone/macrodantin/MTX  No exposure to asbestos, silica or other  organic allergens  Smoking history: 45-pack-year smoker.  Quit in 2013 Travel history: Lives in [place] .  No significant recent travel Family history: No family history of lung disease   Outpatient Encounter Medications as of 05/06/2024  Medication Sig   acetaminophen  (TYLENOL ) 500 MG tablet Take 500 mg by mouth every 6 (six) hours as needed for headache or moderate pain.   albuterol  (VENTOLIN  HFA) 108 (90 Base) MCG/ACT inhaler Inhale 2 puffs into the lungs every 6 (six) hours as needed for wheezing or shortness of breath.   amLODipine  (NORVASC ) 5 MG tablet Take 1 tablet (5 mg total) by mouth daily.   aspirin  81 MG EC tablet Take 81 mg by mouth daily.   Blood Glucose Monitoring Suppl (ONE TOUCH ULTRA 2) w/Device KIT USE AS DIRECTED TO TEST GLUCOSE ONCE DAILY DX E11.65   carvedilol  (COREG ) 3.125 MG tablet TAKE 1 TABLET BY MOUTH 2 TIMES DAILY. *STOP METOPROLOL *   glucose blood test strip use as directed to test once daily Dx E11.65   JARDIANCE 25 MG TABS tablet Take 25 mg by mouth daily.   losartan (COZAAR) 50 MG tablet Take 50 mg by mouth daily.   Magnesium 200 MG TABS Take 400 mg by mouth. 2 x week   omeprazole (PRILOSEC) 40 MG capsule Take 1 capsule by mouth daily as needed.    OneTouch Delica Lancets 30G MISC USE AS DIRECTED TO TEST GLUCOSE ONCE DAILY DX E11.65   spironolactone  (ALDACTONE ) 25 MG tablet Take 0.5 tablets (12.5 mg total) by mouth daily.   tamsulosin  (FLOMAX ) 0.4 MG CAPS capsule Take 0.4 mg by mouth daily after supper.   Vitamin D, Ergocalciferol, (DRISDOL) 1.25 MG (50000 UNIT) CAPS capsule Take 50,000 Units  by mouth every Wednesday.   No facility-administered encounter medications on file as of 05/06/2024.     Physical Exam: Today's Vitals   05/06/24 1426  BP: 115/75  Pulse: 72  Temp: 98.2 F (36.8 C)  TempSrc: Oral  SpO2: 94%  Weight: 190 lb (86.2 kg)  Height: 5' 11 (1.803 m)   Body mass index is 26.5 kg/m.  Physical Exam GEN: No acute distress CV: Regular  rate and rhythm no murmurs LUNGS: Clear to auscultation bilaterally normal respiratory effort SKIN JOINTS: Warm and dry no rash  Data Reviewed: Imaging: Chest x-ray 08/21/2022-no active cardiopulmonary disease I have reviewed the images personally.  PFTs: 4//2023 FVC 3.36 [85%], FEV1 1.53 [51%], F/F46, TLC 6.78 [93%], DLCO 12.81 [48%] Moderate obstruction, moderate diffusion defect  Labs: CBC/08/2022-WBC 8.0, eos 3%, absolute eosinophil count 240 Assessment & Plan Chronic obstructive pulmonary disease Moderate COPD confirmed by lung function tests from 2023, contributing to dyspnea. Smoking history of approximately 45 years, with cessation over 13 years ago. Currently using albuterol  inhaler as needed. No eosinophilic inflammation present on CBC, so inhaled steroid not indicated at this time. - Prescribed Anoro inhaler once daily in the morning. - Ordered annual chest CT for lung cancer screening due to smoking history. - Scheduled follow-up in 3 months to assess response to Anoro.  Recommendations: Start Anoro inhaler Screening CT chest referral  Lonna Coder MD [facility] 05/06/2024, 2:50 PM  CC: Alvan Dorn FALCON, MD   "

## 2024-05-06 NOTE — Patient Instructions (Signed)
" °  VISIT SUMMARY: You visited us  today to evaluate your shortness of breath and possible COPD. We discussed your history of smoking, use of a CPAP machine, and current COPD management.  YOUR PLAN: CHRONIC OBSTRUCTIVE PULMONARY DISEASE (COPD): You have moderate COPD, which is contributing to your shortness of breath. This was confirmed by lung function tests. -Start using the Anoro inhaler once daily in the morning. -Continue using your albuterol  inhaler as needed for symptom relief. -We have ordered an annual chest CT scan for lung cancer screening due to your smoking history. -Schedule a follow-up appointment in 3 months to assess your response to the Anoro inhaler.                     Contains text generated by Abridge.   "

## 2024-05-13 ENCOUNTER — Telehealth: Payer: Self-pay

## 2024-05-13 DIAGNOSIS — Z87891 Personal history of nicotine dependence: Secondary | ICD-10-CM

## 2024-05-13 DIAGNOSIS — Z122 Encounter for screening for malignant neoplasm of respiratory organs: Secondary | ICD-10-CM

## 2024-05-13 NOTE — Telephone Encounter (Signed)
 Lung Cancer Screening Narrative/Criteria Questionnaire (Cigarette Smokers Only- No Cigars/Pipes/vapes)   Troy Petty   SDMV:05/21/2024 at 10:15 am [place]     Jul 10, 1950               LDCT: 05/28/2024 at 1:00 pm at [facility]    74 y.o.  Phone: [phone]  Lung Screening Narrative (confirm age 6-77 yrs Medicare / 50-80 yrs Private pay insurance)   Insurance information: Medicare   Referring Provider: Mann[place]   This screening involves an initial phone call with a team member from our program. It is called a shared decision making visit. The initial meeting is required by  insurance and Medicare to make sure you understand the program. This appointment takes about 15-20 minutes to complete. You will complete the screening scan at your scheduled date/time.  This scan takes about 5-10 minutes to complete. You can eat and drink normally before and after the scan.  Criteria questions for Lung Cancer Screening:   Are you a current or former smoker? Former Age began smoking: 13   If you are a former smoker, what year did you quit smoking? Quit 2013 (within 15 yrs)   To calculate your smoking history, I need an accurate estimate of how many packs of cigarettes you smoked per day and for how many years. (Not just the number of PPD you are now smoking)   Years smoking 54 x Packs per day 1 = Pack years 54   (at least 20 pack yrs)   (Make sure they understand that we need to know how much they have smoked in the past, not just the number of PPD they are smoking now)  Do you have a personal history of cancer?  Yes - (type and when diagnosed - 5 yrs cancer free) Colon Cancer 2025 seed implanted and now has annual visits.     Do you have a family history of cancer? Yes  (cancer type and and relative) Father had colon cancer.   Are you coughing up blood?  No  Have you had unexplained weight loss of 15 lbs or more in the last 6 months? No  It looks like you meet all criteria.  When would be a good time  for us  to schedule you for this screening?   Additional information: N/A

## 2024-05-21 ENCOUNTER — Ambulatory Visit: Admitting: Adult Health

## 2024-05-21 ENCOUNTER — Encounter: Payer: Self-pay | Admitting: Adult Health

## 2024-05-21 DIAGNOSIS — Z87891 Personal history of nicotine dependence: Secondary | ICD-10-CM

## 2024-05-21 NOTE — Progress Notes (Signed)
" °  Virtual Visit via Telephone Note  I connected with Troy Petty , 05/21/24 10:19 AM by a telemedicine application and verified that I am speaking with the correct person using two identifiers.  Location: Patient: home Provider: home   I discussed the limitations of evaluation and management by telemedicine and the availability of in person appointments. The patient expressed understanding and agreed to proceed.   Shared Decision Making Visit Lung Cancer Screening Program [phone])   Eligibility: 74 y.o. Pack Years Smoking History Calculation = 54 pack years (# packs/per year x # years smoked) Recent History of coughing up blood  no Unexplained weight loss? no ( >Than 15 pounds within the last 6 months ) Prior History Lung / other cancer - prostate cancer 2025 (Diagnosis within the last 5 years already requiring surveillance chest CT Scans). Smoking Status Former Smoker Former Smokers: Years since quit: 13 years  Quit Date: 2013  Visit Components: Discussion included one or more decision making aids. YES Discussion included risk/benefits of screening. YES Discussion included potential follow up diagnostic testing for abnormal scans. YES Discussion included meaning and risk of over diagnosis. YES Discussion included meaning and risk of False Positives. YES Discussion included meaning of total radiation exposure. YES  Counseling Included: Importance of adherence to annual lung cancer LDCT screening. YES Impact of comorbidities on ability to participate in the program. YES Ability and willingness to under diagnostic treatment. YES  Smoking Cessation Counseling: Former Smokers:  Discussed the importance of maintaining cigarette abstinence. yes Diagnosis Code: Personal History of Nicotine Dependence. S12.108 Information about tobacco cessation classes and interventions provided to patient. Yes Patient provided with ticket for LDCT Scan. yes Written Order for Lung  Cancer Screening with LDCT placed in Epic. Yes (CT Chest Lung Cancer Screening Low Dose W/O CM) [id number]  Z12.2-Screening of respiratory organs Z87.891-Personal history of nicotine dependence   Troy Petty 05/21/24        "

## 2024-05-21 NOTE — Patient Instructions (Signed)

## 2024-05-22 ENCOUNTER — Ambulatory Visit: Payer: Medicare Other

## 2024-05-22 DIAGNOSIS — I442 Atrioventricular block, complete: Secondary | ICD-10-CM | POA: Diagnosis not present

## 2024-05-22 LAB — CUP PACEART REMOTE DEVICE CHECK
Battery Remaining Longevity: 66 mo
Battery Remaining Percentage: 74 %
Battery Voltage: 2.98 V
Brady Statistic AP VP Percent: 7.7 %
Brady Statistic AP VS Percent: 1 %
Brady Statistic AS VP Percent: 92 %
Brady Statistic AS VS Percent: 1 %
Brady Statistic RA Percent Paced: 7.3 %
Date Time Interrogation Session: 20260122025032
Implantable Lead Connection Status: 753985
Implantable Lead Connection Status: 753985
Implantable Lead Connection Status: 753985
Implantable Lead Implant Date: 20141114
Implantable Lead Implant Date: 20141114
Implantable Lead Implant Date: 20240422
Implantable Lead Location: 753858
Implantable Lead Location: 753859
Implantable Lead Location: 753860
Implantable Pulse Generator Implant Date: 20240422
Lead Channel Impedance Value: 330 Ohm
Lead Channel Impedance Value: 530 Ohm
Lead Channel Impedance Value: 940 Ohm
Lead Channel Pacing Threshold Amplitude: 0.5 V
Lead Channel Pacing Threshold Amplitude: 0.75 V
Lead Channel Pacing Threshold Amplitude: 1 V
Lead Channel Pacing Threshold Pulse Width: 0.5 ms
Lead Channel Pacing Threshold Pulse Width: 0.5 ms
Lead Channel Pacing Threshold Pulse Width: 0.5 ms
Lead Channel Sensing Intrinsic Amplitude: 12 mV
Lead Channel Sensing Intrinsic Amplitude: 4 mV
Lead Channel Setting Pacing Amplitude: 2 V
Lead Channel Setting Pacing Amplitude: 2.5 V
Lead Channel Setting Pacing Amplitude: 2.5 V
Lead Channel Setting Pacing Pulse Width: 0.5 ms
Lead Channel Setting Pacing Pulse Width: 0.5 ms
Lead Channel Setting Sensing Sensitivity: 4 mV
Pulse Gen Model: 3562
Pulse Gen Serial Number: 8126985

## 2024-05-24 NOTE — Progress Notes (Signed)
 Remote PPM Transmission

## 2024-05-27 ENCOUNTER — Ambulatory Visit: Payer: Self-pay | Admitting: Cardiovascular Disease

## 2024-05-28 ENCOUNTER — Inpatient Hospital Stay
Admission: RE | Admit: 2024-05-28 | Discharge: 2024-05-28 | Disposition: A | Source: Ambulatory Visit | Attending: Family Medicine | Admitting: Family Medicine

## 2024-05-28 DIAGNOSIS — Z87891 Personal history of nicotine dependence: Secondary | ICD-10-CM

## 2024-05-28 DIAGNOSIS — Z122 Encounter for screening for malignant neoplasm of respiratory organs: Secondary | ICD-10-CM

## 2024-06-02 ENCOUNTER — Other Ambulatory Visit: Payer: Self-pay

## 2024-06-02 DIAGNOSIS — Z122 Encounter for screening for malignant neoplasm of respiratory organs: Secondary | ICD-10-CM

## 2024-06-02 DIAGNOSIS — Z87891 Personal history of nicotine dependence: Secondary | ICD-10-CM

## 2024-07-16 ENCOUNTER — Ambulatory Visit: Admitting: Cardiology

## 2024-08-12 ENCOUNTER — Ambulatory Visit: Admitting: Pulmonary Disease
